# Patient Record
Sex: Male | Born: 1985 | Race: White | Hispanic: No | Smoking: Former smoker
Health system: Southern US, Community
[De-identification: ages and names within clinical notes are randomized; demographics above are authoritative.]

## PROBLEM LIST (undated history)

## (undated) DIAGNOSIS — L400 Psoriasis vulgaris: Secondary | ICD-10-CM

## (undated) DIAGNOSIS — L4050 Arthropathic psoriasis, unspecified: Secondary | ICD-10-CM

## (undated) DIAGNOSIS — K219 Gastro-esophageal reflux disease without esophagitis: Secondary | ICD-10-CM

## (undated) HISTORY — PX: CHOLECYSTECTOMY: SHX55

---

## 2004-08-30 HISTORY — PX: KNEE RECONSTRUCTION: SHX5883

## 2018-10-24 DIAGNOSIS — N4400 Torsion of testis, unspecified: Secondary | ICD-10-CM

## 2018-10-24 DIAGNOSIS — K851 Biliary acute pancreatitis without necrosis or infection: Secondary | ICD-10-CM

## 2018-10-24 HISTORY — DX: Biliary acute pancreatitis without necrosis or infection: K85.10

## 2018-10-24 HISTORY — DX: Torsion of testis, unspecified: N44.00

## 2018-10-24 HISTORY — PX: ESOPHAGOGASTRODUODENOSCOPY: SHX1529

## 2020-03-10 DIAGNOSIS — F909 Attention-deficit hyperactivity disorder, unspecified type: Secondary | ICD-10-CM | POA: Diagnosis not present

## 2020-06-10 DIAGNOSIS — N4611 Organic oligospermia: Secondary | ICD-10-CM | POA: Diagnosis not present

## 2020-06-10 DIAGNOSIS — Z113 Encounter for screening for infections with a predominantly sexual mode of transmission: Secondary | ICD-10-CM | POA: Diagnosis not present

## 2020-06-23 DIAGNOSIS — Z3184 Encounter for fertility preservation procedure: Secondary | ICD-10-CM | POA: Diagnosis not present

## 2020-06-23 DIAGNOSIS — Z3141 Encounter for fertility testing: Secondary | ICD-10-CM | POA: Diagnosis not present

## 2020-07-21 DIAGNOSIS — Z3189 Encounter for other procreative management: Secondary | ICD-10-CM | POA: Diagnosis not present

## 2020-09-24 DIAGNOSIS — Z01818 Encounter for other preprocedural examination: Secondary | ICD-10-CM | POA: Diagnosis not present

## 2020-09-24 DIAGNOSIS — R1319 Other dysphagia: Secondary | ICD-10-CM | POA: Diagnosis not present

## 2020-09-24 DIAGNOSIS — Z9229 Personal history of other drug therapy: Secondary | ICD-10-CM | POA: Diagnosis not present

## 2020-09-27 ENCOUNTER — Emergency Department (HOSPITAL_COMMUNITY): Payer: BC Managed Care – PPO

## 2020-09-27 ENCOUNTER — Other Ambulatory Visit: Payer: Self-pay

## 2020-09-27 ENCOUNTER — Encounter (HOSPITAL_COMMUNITY): Payer: Self-pay | Admitting: Emergency Medicine

## 2020-09-27 ENCOUNTER — Inpatient Hospital Stay (HOSPITAL_COMMUNITY)
Admission: EM | Admit: 2020-09-27 | Discharge: 2020-09-29 | DRG: 287 | Disposition: A | Discharge status: Home / Self Care | Payer: BC Managed Care – PPO | Attending: Cardiology | Admitting: Cardiology

## 2020-09-27 DIAGNOSIS — R9439 Abnormal result of other cardiovascular function study: Secondary | ICD-10-CM | POA: Diagnosis not present

## 2020-09-27 DIAGNOSIS — Z20822 Contact with and (suspected) exposure to covid-19: Secondary | ICD-10-CM | POA: Diagnosis present

## 2020-09-27 DIAGNOSIS — I2510 Atherosclerotic heart disease of native coronary artery without angina pectoris: Secondary | ICD-10-CM

## 2020-09-27 DIAGNOSIS — R7303 Prediabetes: Secondary | ICD-10-CM

## 2020-09-27 DIAGNOSIS — K859 Acute pancreatitis without necrosis or infection, unspecified: Secondary | ICD-10-CM | POA: Diagnosis not present

## 2020-09-27 DIAGNOSIS — R079 Chest pain, unspecified: Secondary | ICD-10-CM

## 2020-09-27 DIAGNOSIS — K8689 Other specified diseases of pancreas: Secondary | ICD-10-CM | POA: Diagnosis not present

## 2020-09-27 DIAGNOSIS — E785 Hyperlipidemia, unspecified: Secondary | ICD-10-CM

## 2020-09-27 DIAGNOSIS — K760 Fatty (change of) liver, not elsewhere classified: Secondary | ICD-10-CM | POA: Diagnosis present

## 2020-09-27 DIAGNOSIS — I25110 Atherosclerotic heart disease of native coronary artery with unstable angina pectoris: Principal | ICD-10-CM | POA: Diagnosis present

## 2020-09-27 DIAGNOSIS — E7800 Pure hypercholesterolemia, unspecified: Secondary | ICD-10-CM | POA: Diagnosis not present

## 2020-09-27 DIAGNOSIS — K219 Gastro-esophageal reflux disease without esophagitis: Secondary | ICD-10-CM | POA: Diagnosis present

## 2020-09-27 DIAGNOSIS — R0602 Shortness of breath: Secondary | ICD-10-CM | POA: Diagnosis not present

## 2020-09-27 DIAGNOSIS — Z9049 Acquired absence of other specified parts of digestive tract: Secondary | ICD-10-CM | POA: Diagnosis not present

## 2020-09-27 DIAGNOSIS — R1110 Vomiting, unspecified: Secondary | ICD-10-CM | POA: Diagnosis not present

## 2020-09-27 DIAGNOSIS — R7989 Other specified abnormal findings of blood chemistry: Secondary | ICD-10-CM

## 2020-09-27 HISTORY — DX: Gastro-esophageal reflux disease without esophagitis: K21.9

## 2020-09-27 HISTORY — DX: Psoriasis vulgaris: L40.0

## 2020-09-27 HISTORY — DX: Arthropathic psoriasis, unspecified: L40.50

## 2020-09-27 LAB — CBC
HCT: 47.5 % (ref 39.0–52.0)
Hemoglobin: 16.1 g/dL (ref 13.0–17.0)
MCH: 30.9 pg (ref 26.0–34.0)
MCHC: 33.9 g/dL (ref 30.0–36.0)
MCV: 91.2 fL (ref 80.0–100.0)
Platelets: 201 10*3/uL (ref 150–400)
RBC: 5.21 MIL/uL (ref 4.22–5.81)
RDW: 13.2 % (ref 11.5–15.5)
WBC: 8.1 10*3/uL (ref 4.0–10.5)
nRBC: 0 % (ref 0.0–0.2)

## 2020-09-27 LAB — CREATININE, SERUM
Creatinine, Ser: 0.63 mg/dL (ref 0.61–1.24)
GFR, Estimated: >60 mL/min (ref >60)

## 2020-09-27 LAB — RESP PANEL BY RT-PCR (FLU A&B, COVID) ARPGX2
Influenza A by PCR: NEGATIVE
Influenza B by PCR: NEGATIVE
SARS Coronavirus 2 by RT PCR: NEGATIVE

## 2020-09-27 LAB — BASIC METABOLIC PANEL
Anion gap: 17 — ABNORMAL HIGH (ref 5–15)
BUN: 5 mg/dL — ABNORMAL LOW (ref 6–20)
CO2: 23 mmol/L (ref 22–32)
Calcium: 10.1 mg/dL (ref 8.9–10.3)
Chloride: 99 mmol/L (ref 98–111)
Creatinine, Ser: 0.83 mg/dL (ref 0.61–1.24)
GFR, Estimated: >60 mL/min (ref >60)
Glucose, Bld: 161 mg/dL — ABNORMAL HIGH (ref 70–99)
Potassium: 3.6 mmol/L (ref 3.5–5.1)
Sodium: 139 mmol/L (ref 135–145)

## 2020-09-27 LAB — HEMOGLOBIN A1C
Hgb A1c MFr Bld: 6.1 % — ABNORMAL HIGH (ref 4.8–5.6)
Mean Plasma Glucose: 128.37 mg/dL

## 2020-09-27 LAB — TSH: TSH: 2.962 u[IU]/mL (ref 0.350–4.500)

## 2020-09-27 LAB — LIPID PANEL
Cholesterol: 301 mg/dL — ABNORMAL HIGH (ref 0–200)
HDL: 129 mg/dL (ref >40)
LDL Cholesterol: 158 mg/dL — ABNORMAL HIGH (ref 0–99)
Total CHOL/HDL Ratio: 2.3 RATIO
Triglycerides: 69 mg/dL (ref <150)
VLDL: 14 mg/dL (ref 0–40)

## 2020-09-27 LAB — HIV ANTIBODY (ROUTINE TESTING W REFLEX): HIV Screen 4th Generation wRfx: NONREACTIVE

## 2020-09-27 LAB — HEPATIC FUNCTION PANEL
ALT: 175 U/L — ABNORMAL HIGH (ref 0–44)
AST: 160 U/L — ABNORMAL HIGH (ref 15–41)
Albumin: 4.3 g/dL (ref 3.5–5.0)
Alkaline Phosphatase: 102 U/L (ref 38–126)
Bilirubin, Direct: 0.2 mg/dL (ref 0.0–0.2)
Indirect Bilirubin: 0.6 mg/dL (ref 0.3–0.9)
Total Bilirubin: 0.8 mg/dL (ref 0.3–1.2)
Total Protein: 7.5 g/dL (ref 6.5–8.1)

## 2020-09-27 LAB — PROTIME-INR
INR: 0.9 (ref 0.8–1.2)
Prothrombin Time: 12.1 seconds (ref 11.4–15.2)

## 2020-09-27 LAB — LIPASE, BLOOD: Lipase: 21 U/L (ref 11–51)

## 2020-09-27 LAB — TROPONIN I (HIGH SENSITIVITY)
Troponin I (High Sensitivity): 6 ng/L (ref <18)
Troponin I (High Sensitivity): 5 ng/L (ref <18)

## 2020-09-27 MED ORDER — METOPROLOL TARTRATE 25 MG PO TABS
100.0000 mg | ORAL_TABLET | Freq: Once | ORAL | Status: DC
  Filled 2020-09-27: qty 4

## 2020-09-27 MED ORDER — SODIUM CHLORIDE 0.9 % IV SOLN: 250.0000 mL | INTRAVENOUS | Status: DC | PRN

## 2020-09-27 MED ORDER — ENOXAPARIN SODIUM 40 MG/0.4ML ~~LOC~~ SOLN
40.0000 mg | SUBCUTANEOUS | Status: DC
  Administered 2020-09-27 – 2020-09-29 (×2): 40 mg via SUBCUTANEOUS
  Filled 2020-09-27 (×3): qty 0.4

## 2020-09-27 MED ORDER — APREMILAST 30 MG PO TABS: 1.0000 | ORAL_TABLET | Freq: Two times a day (BID) | ORAL | Status: DC

## 2020-09-27 MED ORDER — LORAZEPAM 1 MG PO TABS
1.0000 mg | ORAL_TABLET | Freq: Once | ORAL | Status: AC
  Administered 2020-09-27: 1 mg via ORAL
  Filled 2020-09-27: qty 1

## 2020-09-27 MED ORDER — DULOXETINE HCL 30 MG PO CPEP
30.0000 mg | ORAL_CAPSULE | Freq: Every day | ORAL | Status: DC
  Administered 2020-09-27 – 2020-09-29 (×3): 30 mg via ORAL
  Filled 2020-09-27 (×3): qty 1

## 2020-09-27 MED ORDER — ASPIRIN EC 81 MG PO TBEC
81.0000 mg | DELAYED_RELEASE_TABLET | Freq: Every day | ORAL | Status: DC
  Administered 2020-09-28 – 2020-09-29 (×2): 81 mg via ORAL
  Filled 2020-09-27 (×2): qty 1

## 2020-09-27 MED ORDER — HYDROMORPHONE HCL 1 MG/ML IJ SOLN
1.0000 mg | Freq: Once | INTRAMUSCULAR | Status: AC
  Administered 2020-09-27: 1 mg via INTRAVENOUS
  Filled 2020-09-27: qty 1

## 2020-09-27 MED ORDER — SODIUM CHLORIDE 0.9% FLUSH
3.0000 mL | Freq: Two times a day (BID) | INTRAVENOUS | Status: DC
  Administered 2020-09-27 – 2020-09-28 (×3): 3 mL via INTRAVENOUS

## 2020-09-27 MED ORDER — FENTANYL CITRATE (PF) 100 MCG/2ML IJ SOLN: 100.0000 ug | Freq: Once | INTRAMUSCULAR | Status: DC

## 2020-09-27 MED ORDER — AMPHETAMINE-DEXTROAMPHET ER 10 MG PO CP24
30.0000 mg | ORAL_CAPSULE | Freq: Every morning | ORAL | Status: DC
  Administered 2020-09-29: 30 mg via ORAL
  Filled 2020-09-27: qty 3
  Filled 2020-09-27: qty 1

## 2020-09-27 MED ORDER — NITROGLYCERIN 0.4 MG SL SUBL
0.4000 mg | SUBLINGUAL_TABLET | SUBLINGUAL | Status: DC | PRN
  Filled 2020-09-27: qty 1

## 2020-09-27 MED ORDER — METOPROLOL TARTRATE 50 MG PO TABS
50.0000 mg | ORAL_TABLET | Freq: Two times a day (BID) | ORAL | Status: DC
  Administered 2020-09-27 – 2020-09-29 (×5): 50 mg via ORAL
  Filled 2020-09-27: qty 2
  Filled 2020-09-27 (×4): qty 1

## 2020-09-27 MED ORDER — PANTOPRAZOLE SODIUM 40 MG PO TBEC
40.0000 mg | DELAYED_RELEASE_TABLET | Freq: Two times a day (BID) | ORAL | Status: DC
  Administered 2020-09-27 – 2020-09-29 (×5): 40 mg via ORAL
  Filled 2020-09-27 (×5): qty 1

## 2020-09-27 MED ORDER — NITROGLYCERIN 0.4 MG SL SUBL: 0.4000 mg | SUBLINGUAL_TABLET | SUBLINGUAL | Status: DC | PRN

## 2020-09-27 MED ORDER — LACTATED RINGERS IV BOLUS
1000.0000 mL | Freq: Once | INTRAVENOUS | Status: AC
  Administered 2020-09-27: 1000 mL via INTRAVENOUS

## 2020-09-27 MED ORDER — ZOLPIDEM TARTRATE 5 MG PO TABS: 5.0000 mg | ORAL_TABLET | Freq: Every evening | ORAL | Status: DC | PRN

## 2020-09-27 MED ORDER — SODIUM CHLORIDE 0.9% FLUSH: 3.0000 mL | INTRAVENOUS | Status: DC | PRN

## 2020-09-27 MED ORDER — SODIUM CHLORIDE 0.9 % IV BOLUS
1000.0000 mL | Freq: Once | INTRAVENOUS | Status: AC
  Administered 2020-09-27: 1000 mL via INTRAVENOUS

## 2020-09-27 MED ORDER — ASPIRIN 81 MG PO CHEW
324.0000 mg | CHEWABLE_TABLET | Freq: Once | ORAL | Status: AC
  Administered 2020-09-27: 324 mg via ORAL
  Filled 2020-09-27: qty 4

## 2020-09-27 MED ORDER — ALPRAZOLAM 0.25 MG PO TABS: 0.2500 mg | ORAL_TABLET | Freq: Two times a day (BID) | ORAL | Status: DC | PRN

## 2020-09-27 MED ORDER — ACETAMINOPHEN 325 MG PO TABS
650.0000 mg | ORAL_TABLET | ORAL | Status: DC | PRN
  Administered 2020-09-29: 650 mg via ORAL
  Filled 2020-09-27: qty 2

## 2020-09-27 MED ORDER — IOHEXOL 350 MG/ML SOLN
100.0000 mL | Freq: Once | INTRAVENOUS | Status: AC | PRN
  Administered 2020-09-27: 100 mL via INTRAVENOUS

## 2020-09-27 MED ORDER — SODIUM CHLORIDE 0.9 % IV SOLN
80.0000 mg | Freq: Once | INTRAVENOUS | Status: AC
  Administered 2020-09-27: 06:00:00 80 mg via INTRAVENOUS
  Filled 2020-09-27: qty 80

## 2020-09-27 MED ORDER — ONDANSETRON HCL 4 MG/2ML IJ SOLN: 4.0000 mg | Freq: Four times a day (QID) | INTRAMUSCULAR | Status: DC | PRN

## 2020-09-27 NOTE — ED Provider Notes (Signed)
[facility] EMERGENCY DEPARTMENT Provider Note   CSN: [id number] Arrival date & time: 09/27/20  0216     History Chief Complaint  Patient presents with  . Chest Pain    Aaron Burke is a 34 y.o. male.  34 year old male with a history of pancreatitis from gallstones the presents the emerge department today secondary to chest pain.  Patient states he had acute onset of left-sided retrosternal chest pressure and pain.  He initially thought it was heartburn he had multiple episodes of vomiting and was sweaty.  He went to check his blood pressure and was elevated, he felt near syncopal so he crawled his way to his wife's room and was feeling some shortness of breath during this as well.  Was brought to the emergency room in similar condition. Patient states he has a history of indigestion initially thought that is what this might be but states this is much more severe in a different quality than his normal heartburn.  Patient also has a history of anxiety but states that at no point he does feel like a panic attack or primarily anxiety related.   Chest Pain Pain location:  L chest Pain quality: aching   Pain radiates to:  Does not radiate Pain severity:  Mild Onset quality:  Sudden Timing:  Constant Progression:  Improving Associated symptoms: diaphoresis, dizziness and shortness of breath        Past Medical History:  Diagnosis Date  . Pancreatitis     There are no problems to display for this patient.   Past Surgical History:  Procedure Laterality Date  . CHOLECYSTECTOMY         No family history on file.  Social History   Tobacco Use  . Smoking status: Never Smoker  . Smokeless tobacco: Never Used  Substance Use Topics  . Alcohol use: Never  . Drug use: Never    Home Medications Prior to Admission medications   Not on File    Allergies    Patient has no known allergies.  Review of Systems   Review of Systems  Constitutional:  Positive for diaphoresis.  Respiratory: Positive for shortness of breath.   Cardiovascular: Positive for chest pain.  Neurological: Positive for dizziness.  All other systems reviewed and are negative.   Physical Exam Updated Vital Signs BP (!) 130/95   Pulse 100   Temp 98.3 F (36.8 C) (Oral)   Resp 15   Ht 5\' 9"  (1.753 m)   Wt 82 kg   SpO2 98%   BMI 26.70 kg/m   Physical Exam Vitals and nursing note reviewed.  Constitutional:      Appearance: He is well-developed.  HENT:     Head: Normocephalic.     Nose: Nose normal.  Eyes:     Conjunctiva/sclera: Conjunctivae normal.  Cardiovascular:     Rate and Rhythm: Tachycardia present.  Pulmonary:     Effort: Pulmonary effort is normal. No respiratory distress.     Breath sounds: No decreased breath sounds.  Abdominal:     General: There is no distension.     Palpations: Abdomen is soft.  Musculoskeletal:        General: Normal range of motion.     Cervical back: Normal range of motion.     Right lower leg: No edema.     Left lower leg: No edema.  Skin:    General: Skin is warm and dry.  Neurological:     General: No focal  deficit present.     Mental Status: He is alert.     ED Results / Procedures / Treatments   Labs (all labs ordered are listed, but only abnormal results are displayed) Labs Reviewed  BASIC METABOLIC PANEL - Abnormal; Notable for the following components:      Result Value   Glucose, Bld 161 (*)    BUN 5 (*)    Anion gap 17 (*)    All other components within normal limits  RESP PANEL BY RT-PCR (FLU A&B, COVID) ARPGX2  CBC  PROTIME-INR  LIPASE, BLOOD  TROPONIN I (HIGH SENSITIVITY)  TROPONIN I (HIGH SENSITIVITY)    EKG EKG Interpretation  Date/Time:  Sunday September 27 2020 02:18:05 EST Ventricular Rate:  127 PR Interval:  136 QRS Duration: 80 QT Interval:  292 QTC Calculation: 424 R Axis:   83 Text Interpretation: Sinus tachycardia Otherwise normal ECG No old tracing to compare  Confirmed by Marily Memos [phone]) on 09/27/2020 3:15:44 AM   EKG Interpretation  Date/Time:  Sunday September 27 2020 08:25:46 EST Ventricular Rate:  101 PR Interval:  136 QRS Duration: 101 QT Interval:  348 QTC Calculation: 452 R Axis:   39 Text Interpretation: Sinus tachycardia Low voltage, precordial leads Abnormal inferior Q waves since last tracing no significant change Confirmed by Eber Hong ([id number]) on 09/27/2020 8:29:26 AM        Radiology DG Chest 2 View  Result Date: 09/27/2020 CLINICAL DATA:  Chest pain EXAM: CHEST - 2 VIEW COMPARISON:  None. FINDINGS: The heart size and mediastinal contours are within normal limits. Both lungs are clear. The visualized skeletal structures are unremarkable. IMPRESSION: No active cardiopulmonary disease. Electronically Signed   By: Katherine Mantle M.D.   On: 09/27/2020 02:53   CT Angio Chest/Abd/Pel for Dissection W and/or Wo Contrast  Result Date: 09/27/2020 CLINICAL DATA:  Central chest pain with shortness of breath and emesis this morning. EXAM: CT ANGIOGRAPHY CHEST, ABDOMEN AND PELVIS TECHNIQUE: Non-contrast CT of the chest was initially obtained. Multidetector CT imaging through the chest, abdomen and pelvis was performed using the standard protocol during bolus administration of intravenous contrast. Multiplanar reconstructed images and MIPs were obtained and reviewed to evaluate the vascular anatomy. CONTRAST:  OMNIPAQUE IOHEXOL 350 MG/ML SOLN COMPARISON:  None. FINDINGS: CTA CHEST FINDINGS Cardiovascular: Heart size is normal. No pericardial effusion. Dense coronary artery calcifications within the LEFT anterior descending coronary artery. No thoracic aortic aneurysm or evidence of aortic dissection. Mediastinum/Nodes: No mass or enlarged lymph nodes within the mediastinum or perihilar regions. Esophagus appears normal. Trachea and central bronchi are unremarkable. Lungs/Pleura: Lungs are clear.  No pleural effusion or  pneumothorax. Musculoskeletal: Osseous structures about the chest are unremarkable. Review of the MIP images confirms the above findings. CTA ABDOMEN AND PELVIS FINDINGS VASCULAR Aorta: Normal caliber aorta without aneurysm, dissection, vasculitis or significant stenosis. Celiac: Patent without evidence of aneurysm, dissection, vasculitis or significant stenosis. SMA: Patent without evidence of aneurysm, dissection, vasculitis or significant stenosis. Renals: Both renal arteries are patent without evidence of aneurysm, dissection, vasculitis, fibromuscular dysplasia or significant stenosis. IMA: Patent without evidence of aneurysm, dissection, vasculitis or significant stenosis. Inflow: Patent without evidence of aneurysm, dissection, vasculitis or significant stenosis. Veins: No obvious venous abnormality within the limitations of this arterial phase study. Review of the MIP images confirms the above findings. NON-VASCULAR Hepatobiliary: Liver is diffusely low in density indicating fatty infiltration. Surgical changes of cholecystectomy. Pancreas: Ill-defined low-density masslike area within the pancreatic head/uncinate process, measuring  2.9 cm greatest dimension (axial series 10, image 158; coronal series 13, images 57 through 63), suspicious for neoplastic mass. Associated pancreatic duct dilatation distal to the masslike area. No peripancreatic fluid. Spleen: Normal in size without focal abnormality. Adrenals/Urinary Tract: Adrenal glands appear normal. Kidneys are unremarkable without mass, stone or hydronephrosis. Bladder is unremarkable. Stomach/Bowel: No dilated large or small bowel loops. No evidence of bowel wall inflammation. Appendix appears normal. Stomach is unremarkable. Lymphatic: No enlarged lymph nodes are seen within the abdomen or pelvis. Reproductive: Prostate is unremarkable. Other: No free fluid or abscess collection. No free intraperitoneal air. Musculoskeletal: No acute or suspicious  osseous findings. Review of the MIP images confirms the above findings. IMPRESSION: 1. Ill-defined low-density masslike area within the pancreatic head/uncinate process, measuring 2.9 cm greatest dimension, suspicious for neoplastic mass, less likely focal pancreatitis. Recommend further characterization with nonemergent pancreatic protocol MRI. Ordering physician tells me that patient has a history of pancreatitis and pseudocyst. As such, this low-density area within the pancreatic head could represent chronic changes related to a previous episode of pancreatitis. However, neoplastic process remains a concern and pancreatic protocol MRI is still recommended. 2. Dense coronary artery calcifications within the LEFT anterior descending coronary artery. Recommend correlation with any possible associated cardiac symptoms. 3. Fatty infiltration of the liver. 4. Remainder of the chest, abdomen and pelvis CT is unremarkable. No thoracic or abdominal aortic aneurysm or dissection. No evidence of pneumonia or pulmonary edema. No bowel obstruction or evidence of bowel wall inflammation. Appendix is normal. These results were called by telephone at the time of interpretation on 09/27/2020 at 7:52 am to provider [facility] , who verbally acknowledged these results. Electronically Signed   By: Bary Richard M.D.   On: 09/27/2020 07:54    Procedures Procedures (including critical care time)  Medications Ordered in ED Medications  lactated ringers bolus 1,000 mL (0 mLs Intravenous Stopped 09/27/20 0736)  pantoprazole (PROTONIX) 80 mg in sodium chloride 0.9 % 100 mL IVPB (0 mg Intravenous Stopped 09/27/20 0631)  HYDROmorphone (DILAUDID) injection 1 mg (1 mg Intravenous Given 09/27/20 0602)  iohexol (OMNIPAQUE) 350 MG/ML injection 100 mL (100 mLs Intravenous Contrast Given 09/27/20 0645)    ED Course  I have reviewed the triage vital signs and the nursing notes.  Pertinent labs & imaging results that were available  during my care of the patient were reviewed by me and considered in my medical decision making (see chart for details).    MDM Rules/Calculators/A&P                         Overall patient appears well.  His troponins are negative his EKG is okay.  He is low but tachycardic and hypertensive when he got here but this resolved without intervention.  I CT scan concerning for couple findings.  The pancreatic abnormality I think is probably chronic as he had gallstone pancreatitis in the past status post cholecystectomy.  He has GI follow-up on Friday and will address this with them especially the need for an MRI. 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  Very well could be the cause of his symptoms.  Will discuss with the cardiologist for consultation and further recommendations.   8:25 AM Pain has returned. Will repeat ECG. Dilaudid/NTG/ASA ordered.  Repeat ECG unremarkable. D/W cardiologist, will see for recommendations Care transferred pending cards  consult. Holding heparin for now.   Final Clinical Impression(s) / ED Diagnoses Final diagnoses:  None    Rx / DC Orders ED Discharge Orders    None       Alexina Niccoli, Barbara CowerJason, MD 09/27/20 2258

## 2020-09-27 NOTE — ED Triage Notes (Signed)
Patient reports central chest pain with SOB and emesis this morning "indigestion" , denies cough or fever .

## 2020-09-27 NOTE — ED Notes (Signed)
Attempted to call report to the floor. RN unable to take report at this time.

## 2020-09-27 NOTE — Consult Note (Addendum)
Cardiology Consultation:   Patient ID: Aaron Burke; [id number]; [birth year]   Admit date: 09/27/2020 Date of Consult: 09/27/2020  Primary Care Provider: Darrin Nipperollege, [facility] [place] Primary Cardiologist: Little Ishikawahristopher L Natasha Burda, MD New Primary Electrophysiologist:  None   Patient Profile:   Aaron Burke is a 34 y.o. male with no hx of any cardiac issues but has FH CAD, who is being seen today for the evaluation of chest pain at the request of Dr Hyacinth MeekerMiller.  History of Present Illness:   Aaron Burke was in his USOH until early today. At about 1 am, he began vomiting from GERD, then had onset of SSCP.   The pain was mid-chest, like someone sitting on his chest. He was SOB, already nauseated and vomiting. He was dizzy. BP was 172/121, much higher than usual. 9/10 initially.  He took a baby ASA and a clonazepam, then came to the ER.   The clonazepam relaxed him.   In the ER, he got Dilaudid 2 mg, ASA 324 mg, IV Protonix and LR IV. After the 2nd dose of Dilaudid, sx resolved.  He now feels normal. Had a HA, but not that is gone.   He went surfing the week of Thanksgiving, was playing with the dogs and raking leaves. No hx CP w/ exertion.    Past Medical History:  Diagnosis Date  . Gallstone pancreatitis 2020  . GERD (gastroesophageal reflux disease)   . Plaque psoriasis   . Psoriatic arthritis (HCC)   . Testicular torsion 2020    Past Surgical History:  Procedure Laterality Date  . CHOLECYSTECTOMY    . ESOPHAGOGASTRODUODENOSCOPY  2020   with dilatation  . KNEE RECONSTRUCTION Left 08/30/2004     Prior to Admission medications   Medication Sig Start Date End Date Taking? Authorizing Provider  ADDERALL XR 30 MG 24 hr capsule Take 30 mg by mouth every morning. 08/28/20   [provider]  DULoxetine (CYMBALTA) 30 MG capsule Take 30 mg by mouth daily. 09/01/20   [provider]  OTEZLA 30 MG TABS Take 1 tablet by mouth 2 (two) times daily. 06/11/20    [provider]     Inpatient Medications: Scheduled Meds:  Continuous Infusions: . sodium chloride     PRN Meds:   Allergies:   No Known Allergies  Social History:   Social History   Socioeconomic History  . Marital status: Married    Spouse name: Not on file  . Number of children: Not on file  . Years of education: Not on file  . Highest education level: Not on file  Occupational History  . 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 Smoking status: Never Smoker  . Smokeless tobacco: Never Used  Substance and Sexual Activity  . Alcohol use: Never  . Drug use: Never  . Sexual activity: Not on file  Other Topics Concern  . Not on file  Social History Narrative   [place]/ [place] (pregnant).   Social Determinants of Health   Financial Resource Strain:   . Difficulty of Paying Living Expenses: Not on file  Food Insecurity:   . Worried About Programme researcher, broadcasting/film/videounning Out of Food in the Last Year: Not on file  . Ran Out of Food in the Last Year: Not on file  Transportation Needs:   . Lack of Transportation (Medical): Not on file  . Lack of Transportation (Non-Medical): Not on file  Physical Activity:   .  Days of Exercise per Week: Not on file  . Minutes of Exercise per Session: Not on file  Stress:   . Feeling of Stress : Not on file  Social Connections:   . Frequency of Communication with Friends and Family: Not on file  . Frequency of Social Gatherings with Friends and Family: Not on file  . Attends Religious Services: Not on file  . Active Member of Clubs or Organizations: Not on file  . Attends Banker Meetings: Not on file  . Marital Status: Not on file  Intimate Partner Violence:   . Fear of Current or Ex-Partner: Not on file  . Emotionally Abused: Not on file  . Physically Abused: Not on file  . Sexually Abused: Not on file    Family History:   Family History  Problem Relation Age of Onset  .  Hyperlipidemia Mother   . Heart attack Father        x 2, total 4 stents   Family Status:  Family Status  Relation Name Status  . Mother  Alive  . Father  Alive    ROS:  Please see the history of present illness.  All other ROS reviewed and negative.     Physical Exam/Data:   Vitals:   09/27/20 0930 09/27/20 1000 09/27/20 1030 09/27/20 1045  BP: 130/87 (!) 145/88 (!) 148/101 (!) 145/98  Pulse: 99 98 91 88  Resp: 15 14 (!) 22 (!) 23  Temp:      TempSrc:      SpO2: 96% 96% 95% 95%  Weight:      Height:        Intake/Output Summary (Last 24 hours) at 09/27/2020 1116 Last data filed at 09/27/2020 0736 Gross per 24 hour  Intake 1100 ml  Output --  Net 1100 ml    Last 3 Weights 09/27/2020  Weight (lbs) 180 lb 12.4 oz  Weight (kg) 82 kg     Body mass index is 26.7 kg/m.   General:  Well nourished, well developed, male in no acute distress HEENT: normal Lymph: no adenopathy Neck: JVD - Not elevated Endocrine:  No thryomegaly Vascular: No carotid bruits; 4/4 extremity pulses 2+  Cardiac:  normal S1, S2; RRR; no murmur Lungs:  clear bilaterally, no wheezing, rhonchi or rales  Abd: soft, nontender, no hepatomegaly  Ext: no edema Musculoskeletal:  No deformities, BUE and BLE strength normal and equal Skin: warm and dry  Neuro:  CNs 2-12 intact, no focal abnormalities noted Psych:  Normal affect   EKG:  The EKG was personally reviewed and demonstrates:  ST, HR 101, slightly decreased voltage precordial leads Telemetry:  Telemetry was personally reviewed and demonstrates:  ST   CV studies:   None   Laboratory Data:   Chemistry Recent Labs  Lab 09/27/20 0235  NA 139  K 3.6  CL 99  CO2 23  GLUCOSE 161*  BUN 5*  CREATININE 0.83  CALCIUM 10.1  GFRNONAA >60  ANIONGAP 17*    Lab Results  Component Value Date   ALT 175 (H) 09/27/2020   AST 160 (H) 09/27/2020   ALKPHOS 102 09/27/2020   BILITOT 0.8 09/27/2020   Hematology Recent Labs  Lab  09/27/20 0235  WBC 8.1  RBC 5.21  HGB 16.1  HCT 47.5  MCV 91.2  MCH 30.9  MCHC 33.9  RDW 13.2  PLT 201   Cardiac Enzymes High Sensitivity Troponin:   Recent Labs  Lab 09/27/20 0235 09/27/20 0612  TROPONINIHS 6 5      BNPNo results for input(s): BNP, PROBNP in the last 168 hours.  DDimer No results for input(s): DDIMER in the last 168 hours. TSH: No results found for: TSH Lipids: Lab Results  Component Value Date   CHOL 301 (H) 09/27/2020   HDL 129 09/27/2020   LDLCALC 158 (H) 09/27/2020   TRIG 69 09/27/2020   CHOLHDL 2.3 09/27/2020   HgbA1c:No results found for: HGBA1C Magnesium: No results found for: MG   Radiology/Studies:  DG Chest 2 View  Result Date: 09/27/2020 CLINICAL DATA:  Chest pain EXAM: CHEST - 2 VIEW COMPARISON:  None. FINDINGS: The heart size and mediastinal contours are within normal limits. Both lungs are clear. The visualized skeletal structures are unremarkable. IMPRESSION: No active cardiopulmonary disease. Electronically Signed   By: Katherine Mantle M.D.   On: 09/27/2020 02:53   CT Angio Chest/Abd/Pel for Dissection W and/or Wo Contrast  Result Date: 09/27/2020 CLINICAL DATA:  Central chest pain with shortness of breath and emesis this morning. EXAM: CT ANGIOGRAPHY CHEST, ABDOMEN AND PELVIS TECHNIQUE: Non-contrast CT of the chest was initially obtained. Multidetector CT imaging through the chest, abdomen and pelvis was performed using the standard protocol during bolus administration of intravenous contrast. Multiplanar reconstructed images and MIPs were obtained and reviewed to evaluate the vascular anatomy. CONTRAST:  OMNIPAQUE IOHEXOL 350 MG/ML SOLN COMPARISON:  None. FINDINGS: CTA CHEST FINDINGS Cardiovascular: Heart size is normal. No pericardial effusion. Dense coronary artery calcifications within the LEFT anterior descending coronary artery. No thoracic aortic aneurysm or evidence of aortic dissection. Mediastinum/Nodes: No mass or  enlarged lymph nodes within the mediastinum or perihilar regions. Esophagus appears normal. Trachea and central bronchi are unremarkable. Lungs/Pleura: Lungs are clear.  No pleural effusion or pneumothorax. Musculoskeletal: Osseous structures about the chest are unremarkable. Review of the MIP images confirms the above findings. CTA ABDOMEN AND PELVIS FINDINGS VASCULAR Aorta: Normal caliber aorta without aneurysm, dissection, vasculitis or significant stenosis. Celiac: Patent without evidence of aneurysm, dissection, vasculitis or significant stenosis. SMA: Patent without evidence of aneurysm, dissection, vasculitis or significant stenosis. Renals: Both renal arteries are patent without evidence of aneurysm, dissection, vasculitis, fibromuscular dysplasia or significant stenosis. IMA: Patent without evidence of aneurysm, dissection, vasculitis or significant stenosis. Inflow: Patent without evidence of aneurysm, dissection, vasculitis or significant stenosis. Veins: No obvious venous abnormality within the limitations of this arterial phase study. Review of the MIP images confirms the above findings. NON-VASCULAR Hepatobiliary: Liver is diffusely low in density indicating fatty infiltration. Surgical changes of cholecystectomy. Pancreas: Ill-defined low-density masslike area within the pancreatic head/uncinate process, measuring 2.9 cm greatest dimension (axial series 10, image 158; coronal series 13, images 57 through 63), suspicious for neoplastic mass. Associated pancreatic duct dilatation distal to the masslike area. No peripancreatic fluid. Spleen: Normal in size without focal abnormality. Adrenals/Urinary Tract: Adrenal glands appear normal. Kidneys are unremarkable without mass, stone or hydronephrosis. Bladder is unremarkable. Stomach/Bowel: No dilated large or small bowel loops. No evidence of bowel wall inflammation. Appendix appears normal. Stomach is unremarkable. Lymphatic: No enlarged lymph nodes are  seen within the abdomen or pelvis. Reproductive: Prostate is unremarkable. Other: No free fluid or abscess collection. No free intraperitoneal air. Musculoskeletal: No acute or suspicious osseous findings. Review of the MIP images confirms the above findings. IMPRESSION: 1. Ill-defined low-density masslike area within the pancreatic head/uncinate process, measuring 2.9 cm greatest dimension, suspicious for neoplastic mass, less likely focal pancreatitis. Recommend further characterization with nonemergent pancreatic protocol MRI.  Ordering physician tells me that patient has a history of pancreatitis and pseudocyst. As such, this low-density area within the pancreatic head could represent chronic changes related to a previous episode of pancreatitis. However, neoplastic process remains a concern and pancreatic protocol MRI is still recommended. 2. Dense coronary artery calcifications within the LEFT anterior descending coronary artery. Recommend correlation with any possible associated cardiac symptoms. 3. Fatty infiltration of the liver. 4. Remainder of the chest, abdomen and pelvis CT is unremarkable. No thoracic or abdominal aortic aneurysm or dissection. No evidence of pneumonia or pulmonary edema. No bowel obstruction or evidence of bowel wall inflammation. Appendix is normal. These results were called by telephone at the time of interpretation on 09/27/2020 at 7:52 am to provider [facility] , who verbally acknowledged these results. Electronically Signed   By: Bary Richard M.D.   On: 09/27/2020 07:54    Assessment and Plan:   1. Chest pain, moderate risk of cardiac etiology - sx resolved w/ rx, duration was about 5 hours - never had before, good activity level w/out limitations - however, pt has strong family hx of CAD in his father and has calcium on the prox LAD on CT >> think definitive ischemic eval indicated - ck lipids and LFTs for screening, pt says cholesterol a little high last year. - BP  was high initially, but not normally. - cardiac CT likely best option, but HR is elevated, discuss w/ MD.  2. GI issues, GERD, esophageal polyps - has had problems w/ GERD causing N&V on a regular basis.  - also has hx esophageal polyps - has GI MD and is for EGD very soon.  3.  Hyperlipidemia -See lipid profile above -With the calcium on his CT, and family history of premature CAD, goal LDL is less than 70 -However, AST and ALT are elevated, likely related to pancreatic issues -Discuss adding a statin with MD   Otherwise, continue home rx for psoriasis and ADHD  Principal Problem:   Chest pain with moderate risk for cardiac etiology     For questions or updates, please contact [facility] Please consult [email or web address] for contact info under Cardiology/STEMI.   SignedTheodore Demark, PA-C  09/27/2020 11:16 AM   Patient seen and examined.  Agree with above documentation.  Aaron Burke is a 34 year old male with a history of gallstone pancreatitis, pancreatic pseudocyst, esophageal stenosis who who presents today for chest pain.  Reports overnight he had onset of chest pain that he describes as left-sided pressure, 9 out of 10 in intensity, lasted about 5 minutes then improved.  Did have some residual pain that then lasted for hours, resolved with pain meds in the ED.  He is active, reports that he regularly goes on hikes and hunts, and denies any prior exertional chest pain.  In the ED, vital signs notable for pulse 128, BP 150/115, SPO2 97% on room air.  Labs notable for creatinine 0.8, AST 160, ALT 175, LDL 158, hemoglobin 16.1, platelets 201, WBC 8.1 high-sensitivity troponin I 6 > 5.  EKG shows sinus tachycardia, rate 127, no ST abnormality, TWI in III.  On exam, patient is alert and oriented, regular rate and rhythm, no murmurs, lungs CTAB, no LE edema or JVD.  CT chest abdomen pelvis showed ill-defined low-density masslike area within the pancreatic head, dense coronary artery  calcifications within the LAD.  For his chest pain, no evidence of MI with negative troponins.  He is currently chest pain-free.  He does have  a significant family history of CAD, and despite his age, has a significant amount of calcifications in his proximal LAD.  Recommend further evaluation.  Ideally would do a coronary CTA today, but this is limited by several factors: his HR is in the 100s, he just received a contrast bolus for another CT, and he took Viagra yesterday so would need to be cautious about using NTG.  I would recommend the patient be admitted and started on beta-blocker.  If heart rate controlled tomorrow, can evaluate with coronary CTA.  If heart rate remains elevated, would favor Myoview to evaluate for evidence of ischemia.  Will check echo.  I did discuss his pancreas CT findings with ED physician, felt to likely represent changes from his known pseudocyst/pancreatitis, and were planning outpatient follow-up.  He has been following with GI for this.  Suspect his LFTs are elevated due to his pancreatic issues, will monitor for stability and hold off on statin at this time.  If unable to start statin due to transaminitis, will need to establish in lipid clinic for PCSK9 inhibitor.  Little Ishikawa, MD

## 2020-09-27 NOTE — ED Notes (Signed)
Patient transported to CT 

## 2020-09-27 NOTE — ED Provider Notes (Addendum)
Pt seen by cardiology - recommends coronary CT but has heavy calcifications which may make the study less meaninful or useful. They will admit and stress tomorrow     Eber Hong, MD 09/27/20 1023    Eber Hong, MD 09/27/20 1055

## 2020-09-28 ENCOUNTER — Observation Stay (HOSPITAL_COMMUNITY): Payer: BC Managed Care – PPO

## 2020-09-28 ENCOUNTER — Encounter (HOSPITAL_COMMUNITY): Payer: Self-pay | Admitting: Cardiology

## 2020-09-28 DIAGNOSIS — R079 Chest pain, unspecified: Secondary | ICD-10-CM | POA: Diagnosis not present

## 2020-09-28 DIAGNOSIS — E7800 Pure hypercholesterolemia, unspecified: Secondary | ICD-10-CM

## 2020-09-28 DIAGNOSIS — K8689 Other specified diseases of pancreas: Secondary | ICD-10-CM | POA: Diagnosis not present

## 2020-09-28 LAB — NM MYOCAR MULTI W/SPECT W/WALL MOTION / EF
Estimated workload: 1 METS
Exercise duration (min): 5 min
Exercise duration (sec): 16 s
Peak HR: 90 {beats}/min
Rest HR: 46 {beats}/min

## 2020-09-28 LAB — COMPREHENSIVE METABOLIC PANEL
ALT: 129 U/L — ABNORMAL HIGH (ref 0–44)
AST: 95 U/L — ABNORMAL HIGH (ref 15–41)
Albumin: 3.6 g/dL (ref 3.5–5.0)
Alkaline Phosphatase: 91 U/L (ref 38–126)
Anion gap: 8 (ref 5–15)
BUN: 6 mg/dL (ref 6–20)
CO2: 30 mmol/L (ref 22–32)
Calcium: 9.7 mg/dL (ref 8.9–10.3)
Chloride: 103 mmol/L (ref 98–111)
Creatinine, Ser: 0.83 mg/dL (ref 0.61–1.24)
GFR, Estimated: >60 mL/min (ref >60)
Glucose, Bld: 108 mg/dL — ABNORMAL HIGH (ref 70–99)
Potassium: 4.6 mmol/L (ref 3.5–5.1)
Sodium: 141 mmol/L (ref 135–145)
Total Bilirubin: 1.2 mg/dL (ref 0.3–1.2)
Total Protein: 6.4 g/dL — ABNORMAL LOW (ref 6.5–8.1)

## 2020-09-28 LAB — ECHOCARDIOGRAM COMPLETE
Area-P 1/2: 2.73 cm2
Height: 69 in
S' Lateral: 2.9 cm
Weight: 2593.6 oz

## 2020-09-28 MED ORDER — TECHNETIUM TC 99M TETROFOSMIN IV KIT
10.6000 | PACK | Freq: Once | INTRAVENOUS | Status: AC | PRN
  Administered 2020-09-28: 10.6 via INTRAVENOUS

## 2020-09-28 MED ORDER — REGADENOSON 0.4 MG/5ML IV SOLN
INTRAVENOUS | Status: AC
  Administered 2020-09-28: 0.4 mg via INTRAVENOUS
  Filled 2020-09-28: qty 5

## 2020-09-28 MED ORDER — TECHNETIUM TC 99M TETROFOSMIN IV KIT
32.6000 | PACK | Freq: Once | INTRAVENOUS | Status: AC | PRN
  Administered 2020-09-28: 32.6 via INTRAVENOUS

## 2020-09-28 MED ORDER — REGADENOSON 0.4 MG/5ML IV SOLN: 0.4000 mg | Freq: Once | INTRAVENOUS | Status: AC

## 2020-09-28 NOTE — Progress Notes (Signed)
Rudi Rummage presented for a nuclear stress test today.  No immediate complications.  Stress imaging is pending at this time.   Preliminary EKG findings may be listed in the chart, but the stress test result will not be finalized until perfusion imaging is complete.   Manson Passey, [place]  09/28/2020 12:56 PM

## 2020-09-28 NOTE — Progress Notes (Signed)
Progress Note  Patient Name: Aaron Burke Date of Encounter: 09/28/2020  [facility] Cardiologist: Little Ishikawa, MD   Subjective   Feeling well.  No recurrent emesis.  No chest pain.  Inpatient Medications    Scheduled Meds: . amphetamine-dextroamphetamine  30 mg Oral q morning - 10a  . Apremilast  1 tablet Oral BID  . aspirin EC  81 mg Oral Daily  . DULoxetine  30 mg Oral Daily  . enoxaparin (LOVENOX) injection  40 mg Subcutaneous Q24H  . metoprolol tartrate  50 mg Oral BID  . pantoprazole  40 mg Oral BID  . sodium chloride flush  3 mL Intravenous Q12H   Continuous Infusions: . sodium chloride     PRN Meds: sodium chloride, acetaminophen, ALPRAZolam, nitroGLYCERIN, ondansetron (ZOFRAN) IV, sodium chloride flush, zolpidem   Vital Signs    Vitals:   09/27/20 1954 09/28/20 0026 09/28/20 0458 09/28/20 0749  BP: 127/88 (!) 132/102 116/71 128/83  Pulse: (!) 52 (!) 56 (!) 52 (!) 58  Resp: 20 20 20 20   Temp: 97.9 F (36.6 C) 98.1 F (36.7 C) 97.6 F (36.4 C) 98.1 F (36.7 C)  TempSrc: Oral Oral Oral Oral  SpO2: 98% 98% 99% 99%  Weight:  73.5 kg    Height:        Intake/Output Summary (Last 24 hours) at 09/28/2020 0834 Last data filed at 09/27/2020 2140 Gross per 24 hour  Intake 1292.4 ml  Output 460 ml  Net 832.4 ml   Last 3 Weights 09/28/2020 09/27/2020  Weight (lbs) 162 lb 1.6 oz 180 lb 12.4 oz  Weight (kg) 73.528 kg 82 kg      Telemetry    Sinus rhythm, sinus bradycardia.  Rate 40s to 70s- Personally Reviewed  ECG    N/A- Personally Reviewed  Physical Exam   GEN: No acute distress.   Neck: No JVD Cardiac: RRR, no murmurs, rubs, or gallops.  Respiratory: Clear to auscultation bilaterally. GI: Soft, nontender, non-distended  MS: No edema; No deformity. Neuro:  Nonfocal  Psych: Normal affect   Labs    High Sensitivity Troponin:   Recent Labs  Lab 09/27/20 0235 09/27/20 0612  TROPONINIHS 6 5      Chemistry Recent Labs   Lab 09/27/20 0235 09/27/20 0612 09/27/20 1135 09/28/20 0542  NA 139  --   --  141  K 3.6  --   --  4.6  CL 99  --   --  103  CO2 23  --   --  30  GLUCOSE 161*  --   --  108*  BUN 5*  --   --  6  CREATININE 0.83  --  0.63 0.83  CALCIUM 10.1  --   --  9.7  PROT  --  7.5  --  6.4*  ALBUMIN  --  4.3  --  3.6  AST  --  160*  --  95*  ALT  --  175*  --  129*  ALKPHOS  --  102  --  91  BILITOT  --  0.8  --  1.2  GFRNONAA >60  --  >60 >60  ANIONGAP 17*  --   --  8     Hematology Recent Labs  Lab 09/27/20 0235  WBC 8.1  RBC 5.21  HGB 16.1  HCT 47.5  MCV 91.2  MCH 30.9  MCHC 33.9  RDW 13.2  PLT 201    BNPNo results for input(s): BNP, PROBNP in  the last 168 hours.   DDimer No results for input(s): DDIMER in the last 168 hours.   Radiology    DG Chest 2 View  Result Date: 09/27/2020 CLINICAL DATA:  Chest pain EXAM: CHEST - 2 VIEW COMPARISON:  None. FINDINGS: The heart size and mediastinal contours are within normal limits. Both lungs are clear. The visualized skeletal structures are unremarkable. IMPRESSION: No active cardiopulmonary disease. Electronically Signed   By: Katherine Mantle M.D.   On: 09/27/2020 02:53   CT Angio Chest/Abd/Pel for Dissection W and/or Wo Contrast  Result Date: 09/27/2020 CLINICAL DATA:  Central chest pain with shortness of breath and emesis this morning. EXAM: CT ANGIOGRAPHY CHEST, ABDOMEN AND PELVIS TECHNIQUE: Non-contrast CT of the chest was initially obtained. Multidetector CT imaging through the chest, abdomen and pelvis was performed using the standard protocol during bolus administration of intravenous contrast. Multiplanar reconstructed images and MIPs were obtained and reviewed to evaluate the vascular anatomy. CONTRAST:  OMNIPAQUE IOHEXOL 350 MG/ML SOLN COMPARISON:  None. FINDINGS: CTA CHEST FINDINGS Cardiovascular: Heart size is normal. No pericardial effusion. Dense coronary artery calcifications within the LEFT anterior  descending coronary artery. No thoracic aortic aneurysm or evidence of aortic dissection. Mediastinum/Nodes: No mass or enlarged lymph nodes within the mediastinum or perihilar regions. Esophagus appears normal. Trachea and central bronchi are unremarkable. Lungs/Pleura: Lungs are clear.  No pleural effusion or pneumothorax. Musculoskeletal: Osseous structures about the chest are unremarkable. Review of the MIP images confirms the above findings. CTA ABDOMEN AND PELVIS FINDINGS VASCULAR Aorta: Normal caliber aorta without aneurysm, dissection, vasculitis or significant stenosis. Celiac: Patent without evidence of aneurysm, dissection, vasculitis or significant stenosis. SMA: Patent without evidence of aneurysm, dissection, vasculitis or significant stenosis. Renals: Both renal arteries are patent without evidence of aneurysm, dissection, vasculitis, fibromuscular dysplasia or significant stenosis. IMA: Patent without evidence of aneurysm, dissection, vasculitis or significant stenosis. Inflow: Patent without evidence of aneurysm, dissection, vasculitis or significant stenosis. Veins: No obvious venous abnormality within the limitations of this arterial phase study. Review of the MIP images confirms the above findings. NON-VASCULAR Hepatobiliary: Liver is diffusely low in density indicating fatty infiltration. Surgical changes of cholecystectomy. Pancreas: Ill-defined low-density masslike area within the pancreatic head/uncinate process, measuring 2.9 cm greatest dimension (axial series 10, image 158; coronal series 13, images 57 through 63), suspicious for neoplastic mass. Associated pancreatic duct dilatation distal to the masslike area. No peripancreatic fluid. Spleen: Normal in size without focal abnormality. Adrenals/Urinary Tract: Adrenal glands appear normal. Kidneys are unremarkable without mass, stone or hydronephrosis. Bladder is unremarkable. Stomach/Bowel: No dilated large or small bowel loops. No  evidence of bowel wall inflammation. Appendix appears normal. Stomach is unremarkable. Lymphatic: No enlarged lymph nodes are seen within the abdomen or pelvis. Reproductive: Prostate is unremarkable. Other: No free fluid or abscess collection. No free intraperitoneal air. Musculoskeletal: No acute or suspicious osseous findings. Review of the MIP images confirms the above findings. IMPRESSION: 1. Ill-defined low-density masslike area within the pancreatic head/uncinate process, measuring 2.9 cm greatest dimension, suspicious for neoplastic mass, less likely focal pancreatitis. Recommend further characterization with nonemergent pancreatic protocol MRI. Ordering physician tells me that patient has a history of pancreatitis and pseudocyst. As such, this low-density area within the pancreatic head could represent chronic changes related to a previous episode of pancreatitis. However, neoplastic process remains a concern and pancreatic protocol MRI is still recommended. 2. Dense coronary artery calcifications within the LEFT anterior descending coronary artery. Recommend correlation with any possible associated cardiac  symptoms. 3. Fatty infiltration of the liver. 4. Remainder of the chest, abdomen and pelvis CT is unremarkable. No thoracic or abdominal aortic aneurysm or dissection. No evidence of pneumonia or pulmonary edema. No bowel obstruction or evidence of bowel wall inflammation. Appendix is normal. These results were called by telephone at the time of interpretation on 09/27/2020 at 7:52 am to provider [facility] , who verbally acknowledged these results. Electronically Signed   By: Bary Richard M.D.   On: 09/27/2020 07:54    Cardiac Studies   Echo pending  Lexiscan Myoview pending   Patient Profile     34 y.o. male with GERD and family history of CAD and chest pain after emesis.   Assessment & Plan    # Chest pain:  # Coronary calcification:  #Pure hypercholesterolemia: LAD calcification  noted on chest CT.  LDL is 158.  We won't start statin given his elevated LFTs.  He would benefit from a PCSK9 inhibitor as an outpatient.  LDL goal is less than 70.  Stress test pending for this morning.  # Pancreatic mass:  CT of the abdomen revealed a 2.9 cm mass in the pancreas.  Cannot rule out malignancy, though he also has a history of pancreatitis and pseudocyst.  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  This was due to gallstone pancreatitis.  Radiology recommend pancreatic protocol MRI. Lipase wnl.  He has a gastroenterologist and is scheduled to see them on Friday for esophageal dilation.  He will make sure to follow-up with them and have his MRI.  # Hepatic steatosis: Noted on CT.  AST/ALT both elevated, though lower than in the past.  # Pre-diabetes:  A1c is 6.1%.  He was a healthy lifestyle and exercises regularly.  His diet is pretty good.  We will need to work on limiting carbohydrates and if A1c remains elevated recommend starting Metformin.   For questions or updates, please contact [facility] Please consult [email or web address] for contact info under        Signed, Chilton Si, MD  09/28/2020, 8:34 AM

## 2020-09-28 NOTE — Progress Notes (Signed)
Echocardiogram 2D Echocardiogram has been performed.  Aaron Burke Aaron Burke 09/28/2020, 2:17 PM

## 2020-09-29 ENCOUNTER — Encounter (HOSPITAL_COMMUNITY): Payer: Self-pay | Admitting: Cardiology

## 2020-09-29 ENCOUNTER — Encounter (HOSPITAL_COMMUNITY)
Admission: EM | Disposition: A | Discharge status: Home / Self Care | Payer: Self-pay | Source: Home / Self Care | Attending: Cardiology

## 2020-09-29 DIAGNOSIS — R7989 Other specified abnormal findings of blood chemistry: Secondary | ICD-10-CM

## 2020-09-29 DIAGNOSIS — K219 Gastro-esophageal reflux disease without esophagitis: Secondary | ICD-10-CM | POA: Diagnosis present

## 2020-09-29 DIAGNOSIS — E785 Hyperlipidemia, unspecified: Secondary | ICD-10-CM

## 2020-09-29 DIAGNOSIS — E7800 Pure hypercholesterolemia, unspecified: Secondary | ICD-10-CM | POA: Diagnosis present

## 2020-09-29 DIAGNOSIS — R9439 Abnormal result of other cardiovascular function study: Secondary | ICD-10-CM

## 2020-09-29 DIAGNOSIS — R7303 Prediabetes: Secondary | ICD-10-CM | POA: Diagnosis present

## 2020-09-29 DIAGNOSIS — K760 Fatty (change of) liver, not elsewhere classified: Secondary | ICD-10-CM | POA: Diagnosis present

## 2020-09-29 DIAGNOSIS — R079 Chest pain, unspecified: Secondary | ICD-10-CM | POA: Diagnosis present

## 2020-09-29 DIAGNOSIS — Z20822 Contact with and (suspected) exposure to covid-19: Secondary | ICD-10-CM | POA: Diagnosis present

## 2020-09-29 DIAGNOSIS — K8689 Other specified diseases of pancreas: Secondary | ICD-10-CM

## 2020-09-29 DIAGNOSIS — I25110 Atherosclerotic heart disease of native coronary artery with unstable angina pectoris: Secondary | ICD-10-CM | POA: Diagnosis present

## 2020-09-29 HISTORY — PX: LEFT HEART CATH AND CORONARY ANGIOGRAPHY: CATH118249

## 2020-09-29 LAB — CBC
HCT: 43.8 % (ref 39.0–52.0)
Hemoglobin: 14.3 g/dL (ref 13.0–17.0)
MCH: 31.1 pg (ref 26.0–34.0)
MCHC: 32.6 g/dL (ref 30.0–36.0)
MCV: 95.2 fL (ref 80.0–100.0)
Platelets: 169 10*3/uL (ref 150–400)
RBC: 4.6 MIL/uL (ref 4.22–5.81)
RDW: 13.2 % (ref 11.5–15.5)
WBC: 4.9 10*3/uL (ref 4.0–10.5)
nRBC: 0 % (ref 0.0–0.2)

## 2020-09-29 SURGERY — LEFT HEART CATH AND CORONARY ANGIOGRAPHY
Anesthesia: LOCAL

## 2020-09-29 MED ORDER — SODIUM CHLORIDE 0.9 % WEIGHT BASED INFUSION
3.0000 mL/kg/h | INTRAVENOUS | Status: DC
  Administered 2020-09-29: 3 mL/kg/h via INTRAVENOUS

## 2020-09-29 MED ORDER — SODIUM CHLORIDE 0.9% FLUSH: 3.0000 mL | INTRAVENOUS | Status: DC | PRN

## 2020-09-29 MED ORDER — SODIUM CHLORIDE 0.9 % IV SOLN: 250.0000 mL | INTRAVENOUS | Status: DC | PRN

## 2020-09-29 MED ORDER — SODIUM CHLORIDE 0.9 % WEIGHT BASED INFUSION
1.0000 mL/kg/h | INTRAVENOUS | Status: DC
  Administered 2020-09-29: 1 mL/kg/h via INTRAVENOUS

## 2020-09-29 MED ORDER — MIDAZOLAM HCL 2 MG/2ML IJ SOLN
INTRAMUSCULAR | Status: AC
  Filled 2020-09-29: qty 2

## 2020-09-29 MED ORDER — HEPARIN (PORCINE) IN NACL 1000-0.9 UT/500ML-% IV SOLN
INTRAVENOUS | Status: AC
  Filled 2020-09-29: qty 1000

## 2020-09-29 MED ORDER — VERAPAMIL HCL 2.5 MG/ML IV SOLN
INTRAVENOUS | Status: AC
  Filled 2020-09-29: qty 2

## 2020-09-29 MED ORDER — FENTANYL CITRATE (PF) 100 MCG/2ML IJ SOLN
INTRAMUSCULAR | Status: DC | PRN
  Administered 2020-09-29: 25 ug via INTRAVENOUS

## 2020-09-29 MED ORDER — VERAPAMIL HCL 2.5 MG/ML IV SOLN: INTRAVENOUS | Status: DC | PRN

## 2020-09-29 MED ORDER — HEPARIN (PORCINE) IN NACL 1000-0.9 UT/500ML-% IV SOLN
INTRAVENOUS | Status: DC | PRN
  Administered 2020-09-29 (×2): 500 mL

## 2020-09-29 MED ORDER — ENOXAPARIN SODIUM 40 MG/0.4ML ~~LOC~~ SOLN: 40.0000 mg | SUBCUTANEOUS | Status: DC

## 2020-09-29 MED ORDER — IOHEXOL 350 MG/ML SOLN
INTRAVENOUS | Status: DC | PRN
  Administered 2020-09-29: 25 mL

## 2020-09-29 MED ORDER — SODIUM CHLORIDE 0.9% FLUSH: 3.0000 mL | Freq: Two times a day (BID) | INTRAVENOUS | Status: DC

## 2020-09-29 MED ORDER — LIDOCAINE HCL (PF) 1 % IJ SOLN
INTRAMUSCULAR | Status: DC | PRN
  Administered 2020-09-29: 2 mL

## 2020-09-29 MED ORDER — ASPIRIN 81 MG PO CHEW: 81.0000 mg | CHEWABLE_TABLET | ORAL | Status: DC

## 2020-09-29 MED ORDER — LIDOCAINE HCL (PF) 1 % IJ SOLN
INTRAMUSCULAR | Status: AC
  Filled 2020-09-29: qty 30

## 2020-09-29 MED ORDER — FENTANYL CITRATE (PF) 100 MCG/2ML IJ SOLN
INTRAMUSCULAR | Status: AC
  Filled 2020-09-29: qty 2

## 2020-09-29 MED ORDER — HEPARIN SODIUM (PORCINE) 1000 UNIT/ML IJ SOLN
INTRAMUSCULAR | Status: DC | PRN
  Administered 2020-09-29: 4000 [IU] via INTRAVENOUS

## 2020-09-29 MED ORDER — HEPARIN SODIUM (PORCINE) 1000 UNIT/ML IJ SOLN
INTRAMUSCULAR | Status: AC
  Filled 2020-09-29: qty 1

## 2020-09-29 MED ORDER — MIDAZOLAM HCL 2 MG/2ML IJ SOLN
INTRAMUSCULAR | Status: DC | PRN
  Administered 2020-09-29: 1 mg via INTRAVENOUS

## 2020-09-29 SURGICAL SUPPLY — 10 items
CATH 5FR JL3.5 JR4 ANG PIG MP (CATHETERS) ×2 IMPLANT
DEVICE RAD COMP TR BAND LRG (VASCULAR PRODUCTS) ×2 IMPLANT
GLIDESHEATH SLEND SS 6F .021 (SHEATH) ×2 IMPLANT
GUIDEWIRE INQWIRE 1.5J.035X260 (WIRE) ×1 IMPLANT
INQWIRE 1.5J .035X260CM (WIRE) ×2
KIT HEART LEFT (KITS) ×2 IMPLANT
PACK CARDIAC CATHETERIZATION (CUSTOM PROCEDURE TRAY) ×2 IMPLANT
SHEATH PROBE COVER 6X72 (BAG) ×2 IMPLANT
TRANSDUCER W/STOPCOCK (MISCELLANEOUS) ×2 IMPLANT
TUBING CIL FLEX 10 FLL-RA (TUBING) ×2 IMPLANT

## 2020-09-29 NOTE — H&P (View-Only) (Signed)
Progress Note  Patient Name: Aaron Burke Date of Encounter: 09/29/2020  [facility] Cardiologist: Little Ishikawa, MD   Subjective   Feeling well.  No recurrent emesis.  No chest pain.  Inpatient Medications    Scheduled Meds: . amphetamine-dextroamphetamine  30 mg Oral q morning - 10a  . Apremilast  1 tablet Oral BID  . aspirin  81 mg Oral Pre-Cath  . aspirin EC  81 mg Oral Daily  . DULoxetine  30 mg Oral Daily  . enoxaparin (LOVENOX) injection  40 mg Subcutaneous Q24H  . metoprolol tartrate  50 mg Oral BID  . pantoprazole  40 mg Oral BID  . sodium chloride flush  3 mL Intravenous Q12H  . sodium chloride flush  3 mL Intravenous Q12H   Continuous Infusions: . sodium chloride    . sodium chloride    . sodium chloride     Followed by  . sodium chloride     PRN Meds: sodium chloride, sodium chloride, acetaminophen, ALPRAZolam, nitroGLYCERIN, ondansetron (ZOFRAN) IV, sodium chloride flush, sodium chloride flush, zolpidem   Vital Signs    Vitals:   09/28/20 1710 09/28/20 1936 09/29/20 0022 09/29/20 0337  BP: 114/83 124/82 126/79 133/72  Pulse: (!) 57 (!) 56 65 (!) 50  Resp: 20 17 18 18   Temp: 98.1 F (36.7 C) 98.6 F (37 C) 98.4 F (36.9 C) 98.6 F (37 C)  TempSrc: Oral Oral Oral Oral  SpO2: 98% 96% 97% 96%  Weight:    73.8 kg  Height:        Intake/Output Summary (Last 24 hours) at 09/29/2020 0958 Last data filed at 09/29/2020 0342 Gross per 24 hour  Intake 1200 ml  Output 950 ml  Net 250 ml   Last 3 Weights 09/29/2020 09/28/2020 09/27/2020  Weight (lbs) 162 lb 12.8 oz 162 lb 1.6 oz 180 lb 12.4 oz  Weight (kg) 73.846 kg 73.528 kg 82 kg      Telemetry    Sinus rhythm, sinus bradycardia.  Rate 40s to 70s- Personally Reviewed  ECG    N/A- Personally Reviewed  Physical Exam   VS:  BP 133/72 (BP Location: Right Arm)   Pulse (!) 50   Temp 98.6 F (37 C) (Oral)   Resp 18   Ht 5\' 9"  (1.753 m)   Wt 73.8 kg   SpO2 96%   BMI 24.04 kg/m   , BMI Body mass index is 24.04 kg/m. GENERAL:  Well appearing HEENT: Pupils equal round and reactive, fundi not visualized, oral mucosa unremarkable NECK:  No jugular venous distention, waveform within normal limits, carotid upstroke brisk and symmetric, no bruits LUNGS:  Clear to auscultation bilaterally HEART:  RRR.  PMI not displaced or sustained,S1 and S2 within normal limits, no S3, no S4, no clicks, no rubs, no murmurs ABD:  Flat, positive bowel sounds normal in frequency in pitch, no bruits, no rebound, no guarding, no midline pulsatile mass, no hepatomegaly, no splenomegaly EXT:  2 plus pulses throughout, no edema, no cyanosis no clubbing SKIN:  No rashes no nodules NEURO:  Cranial nerves II through XII grossly intact, motor grossly intact throughout PSYCH:  Cognitively intact, oriented to person place and time   Labs    High Sensitivity Troponin:   Recent Labs  Lab 09/27/20 0235 09/27/20 0612  TROPONINIHS 6 5      Chemistry Recent Labs  Lab 09/27/20 0235 09/27/20 0612 09/27/20 1135 09/28/20 0542  NA 139  --   --  141  K 3.6  --   --  4.6  CL 99  --   --  103  CO2 23  --   --  30  GLUCOSE 161*  --   --  108*  BUN 5*  --   --  6  CREATININE 0.83  --  0.63 0.83  CALCIUM 10.1  --   --  9.7  PROT  --  7.5  --  6.4*  ALBUMIN  --  4.3  --  3.6  AST  --  160*  --  95*  ALT  --  175*  --  129*  ALKPHOS  --  102  --  91  BILITOT  --  0.8  --  1.2  GFRNONAA >60  --  >60 >60  ANIONGAP 17*  --   --  8     Hematology Recent Labs  Lab 09/27/20 0235  WBC 8.1  RBC 5.21  HGB 16.1  HCT 47.5  MCV 91.2  MCH 30.9  MCHC 33.9  RDW 13.2  PLT 201    BNPNo results for input(s): BNP, PROBNP in the last 168 hours.   DDimer No results for input(s): DDIMER in the last 168 hours.   Radiology    NM Myocar Multi W/Spect W/Wall Motion / EF  Result Date: 09/28/2020 CLINICAL DATA:  Chest pain EXAM: MYOCARDIAL IMAGING WITH SPECT (REST AND PHARMACOLOGIC-STRESS) GATED LEFT  VENTRICULAR WALL MOTION STUDY LEFT VENTRICULAR EJECTION FRACTION TECHNIQUE: Standard myocardial SPECT imaging was performed after resting intravenous injection of 10 mCi Tc-38m tetrofosmin. Subsequently, intravenous infusion of Lexiscan was performed under the supervision of the Cardiology staff. At peak effect of the drug, 30 mCi Tc-58m tetrofosmin was injected intravenously and standard myocardial SPECT imaging was performed. Quantitative gated imaging was also performed to evaluate left ventricular wall motion, and estimate left ventricular ejection fraction. COMPARISON:  None. FINDINGS: Perfusion: There is adjacent bowel activity along the inferior wall on rest imaging only. Allowing for this, there is still decreased activity of the inferior wall on stress imaging on both the short axis and vertical axis imaging. Difficult to exclude inferior wall ischemia with pharmacologic stress. Wall Motion: Normal left ventricular wall motion. No left ventricular dilation. Left Ventricular Ejection Fraction: 70 % End diastolic volume 93 ml End systolic volume 28 ml IMPRESSION: 1. Findings suspicious for inferior wall ischemia with pharmacologic stress. 2. Normal left ventricular wall motion. 3. Left ventricular ejection fraction 70% 4. Non invasive risk stratification*: Intermediate *2012 Appropriate Use Criteria for Coronary Revascularization Focused Update: J Am Coll Cardiol. 2012;59(9):857-881. http://content.dementiazones.com.aspx?articleid=[email or web address] Electronically Signed   By: Judie Petit.  Shick M.D.   On: 09/28/2020 14:29   ECHOCARDIOGRAM COMPLETE  Result Date: 09/28/2020    ECHOCARDIOGRAM REPORT   Patient Name:   Aaron Burke Date of Exam: 09/28/2020 Medical Rec #:  [id number]      Height:       69.0 in Accession #:    [id number]     Weight:       162.1 lb Date of Birth:  [birth year]      BSA:          1.890 m Patient Age:    34 years       BP:           125/91 mmHg Patient Gender: M              HR:           54 bpm.  Exam  Location:  Inpatient Procedure: 2D Echo, Color Doppler and Cardiac Doppler Indications:    R07.9* Chest pain, unspecified  History:        Patient has no prior history of Echocardiogram examinations.  Sonographer:    Irving BurtonEmily Senior RDCS Referring Phys: [id number] CHRISTOPHER L SCHUMANN IMPRESSIONS  1. Left ventricular ejection fraction, by estimation, is 60 to 65%. The left ventricle has normal function. The left ventricle has no regional wall motion abnormalities. Left ventricular diastolic parameters were normal.  2. Right ventricular systolic function is normal. The right ventricular size is normal. Tricuspid regurgitation signal is inadequate for assessing PA pressure.  3. The mitral valve is grossly normal. No evidence of mitral valve regurgitation. No evidence of mitral stenosis.  4. The aortic valve is tricuspid. Aortic valve regurgitation is not visualized. No aortic stenosis is present.  5. The inferior vena cava is normal in size with greater than 50% respiratory variability, suggesting right atrial pressure of 3 mmHg. Conclusion(s)/Recommendation(s): Normal biventricular function without evidence of hemodynamically significant valvular heart disease. FINDINGS  Left Ventricle: Left ventricular ejection fraction, by estimation, is 60 to 65%. The left ventricle has normal function. The left ventricle has no regional wall motion abnormalities. The left ventricular internal cavity size was normal in size. There is  no left ventricular hypertrophy. Left ventricular diastolic parameters were normal. Normal left ventricular filling pressure. Right Ventricle: The right ventricular size is normal. No increase in right ventricular wall thickness. Right ventricular systolic function is normal. Tricuspid regurgitation signal is inadequate for assessing PA pressure. Left Atrium: Left atrial size was normal in size. Right Atrium: Right atrial size was normal in size. Pericardium: Trivial pericardial effusion is present.  Mitral Valve: The mitral valve is grossly normal. No evidence of mitral valve regurgitation. No evidence of mitral valve stenosis. Tricuspid Valve: The tricuspid valve is grossly normal. Tricuspid valve regurgitation is not demonstrated. No evidence of tricuspid stenosis. Aortic Valve: The aortic valve is tricuspid. Aortic valve regurgitation is not visualized. No aortic stenosis is present. Pulmonic Valve: The pulmonic valve was grossly normal. Pulmonic valve regurgitation is not visualized. No evidence of pulmonic stenosis. Aorta: The aortic root is normal in size and structure. Venous: The right upper pulmonary vein is normal. The inferior vena cava is normal in size with greater than 50% respiratory variability, suggesting right atrial pressure of 3 mmHg. IAS/Shunts: The atrial septum is grossly normal.  LEFT VENTRICLE PLAX 2D LVIDd:         4.60 cm  Diastology LVIDs:         2.90 cm  LV e' medial:    11.40 cm/s LV PW:         0.80 cm  LV E/e' medial:  5.9 LV IVS:        0.90 cm  LV e' lateral:   13.90 cm/s LVOT diam:     2.20 cm  LV E/e' lateral: 4.9 LV SV:         78 LV SV Index:   41 LVOT Area:     3.80 cm  RIGHT VENTRICLE RV S prime:     10.30 cm/s TAPSE (M-mode): 2.1 cm LEFT ATRIUM             Index       RIGHT ATRIUM           Index LA diam:        2.40 cm 1.27 cm/m  RA Area:     15.40 cm LA Vol (  A2C):   45.7 ml 24.19 ml/m RA Volume:   41.50 ml  21.96 ml/m LA Vol (A4C):   31.9 ml 16.88 ml/m LA Biplane Vol: 41.6 ml 22.02 ml/m  AORTIC VALVE LVOT Vmax:   87.70 cm/s LVOT Vmean:  63.500 cm/s LVOT VTI:    0.205 m  AORTA Ao Root diam: 3.30 cm MITRAL VALVE MV Area (PHT): 2.73 cm    SHUNTS MV Decel Time: 278 msec    Systemic VTI:  0.20 m MV E velocity: 67.50 cm/s  Systemic Diam: 2.20 cm MV A velocity: 32.10 cm/s MV E/A ratio:  2.10 Lennie Odor MD Electronically signed by Lennie Odor MD Signature Date/Time: 09/28/2020/3:28:17 PM    Final     Cardiac Studies   Echo 09/29/20: 1. Left ventricular  ejection fraction, by estimation, is 60 to 65%. The  left ventricle has normal function. The left ventricle has no regional  wall motion abnormalities. Left ventricular diastolic parameters were  normal.  2. Right ventricular systolic function is normal. The right ventricular  size is normal. Tricuspid regurgitation signal is inadequate for assessing  PA pressure.  3. The mitral valve is grossly normal. No evidence of mitral valve  regurgitation. No evidence of mitral stenosis.  4. The aortic valve is tricuspid. Aortic valve regurgitation is not  visualized. No aortic stenosis is present.  5. The inferior vena cava is normal in size with greater than 50%  respiratory variability, suggesting right atrial pressure of 3 mmHg.   Lexiscan Myoview 09/28/20: IMPRESSION: 1. Findings suspicious for inferior wall ischemia with pharmacologic stress.  2. Normal left ventricular wall motion.  3. Left ventricular ejection fraction 70%  4. Non invasive risk stratification*: Intermediate   Patient Profile     34 y.o. male with GERD and family history of CAD and chest pain after emesis.   Assessment & Plan    # Chest pain:  # Coronary calcification:  #Pure hypercholesterolemia: LAD calcification noted on chest CT.  LDL is 158.  We won't start statin given his elevated LFTs.  He would benefit from a PCSK9 inhibitor as an outpatient.  LDL goal is less than 70.  Stress test was consistent with inferior ischemia.  Calcification on his CT was in the LAD which is not consistent with this.  This may be diaphragmatic attenuation.  However we will proceed with left heart cath today to rule out obstructive heart disease.  Risks and benefits of cardiac catheterization have been discussed with the patient.  The patient understands that risks included but are not limited to stroke (1 in 1000), death (1 in 1000), kidney failure [usually temporary] (1 in 500), bleeding (1 in 200), allergic reaction  [possibly serious] (1 in 200). The patient understands and agrees to proceed.   # Pancreatic mass:  CT of the abdomen revealed a 2.9 cm mass in the pancreas.  Cannot rule out malignancy, though he also has a history of pancreatitis and pseudocyst.  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  This was due to gallstone pancreatitis.  Radiology recommend pancreatic protocol MRI. Lipase wnl.  He has a gastroenterologist and is scheduled to see them on Friday for esophageal dilation.  He will make sure to follow-up with them and have his MRI.  # Hepatic steatosis: Noted on CT.  AST/ALT both elevated, though lower than in the past.  # Pre-diabetes:  A1c is 6.1%.  He was a healthy lifestyle and exercises regularly.  His diet is pretty  good.  We will need to work on limiting carbohydrates and if A1c remains elevated recommend starting Metformin.   For questions or updates, please contact [facility] Please consult [email or web address] for contact info under        Signed, Chilton Si, MD  09/29/2020, 9:58 AM

## 2020-09-29 NOTE — Progress Notes (Signed)
TR band removed, dressing in placed, clean dry and intact. Discharge instruction provided, pt educated on how to take care of the site. Discharged patient on stable condition.

## 2020-09-29 NOTE — Progress Notes (Signed)
Received report form Bethany, RN and assumed care at this time.

## 2020-09-29 NOTE — Progress Notes (Signed)
Obtained informed consent, confirmed with pt NPO status and attempted to start secondary IV site x 2 attempts. IV team consulted for IV start. NPO sign placed on door.

## 2020-09-29 NOTE — Interval H&P Note (Signed)
History and Physical Interval Note:  09/29/2020 1:04 PM  Aaron Burke  has presented today for surgery, with the diagnosis of unstable angina.  The various methods of treatment have been discussed with the patient and family. After consideration of risks, benefits and other options for treatment, the patient has consented to  Procedure(s): LEFT HEART CATH AND CORONARY ANGIOGRAPHY (N/A) as a surgical intervention.  The patient's history has been reviewed, patient examined, no change in status, stable for surgery.  I have reviewed the patient's chart and labs.  Questions were answered to the patient's satisfaction.     Theron Ar[facility] 09/29/2020 1:04 PM Cath Lab Visit (complete for each Cath Lab visit)  Clinical Evaluation Leading to the Procedure:   ACS: Yes.    Non-ACS:    Anginal Classification: CCS II  Anti-ischemic medical therapy: Minimal Therapy (1 class of medications)  Non-Invasive Test Results: Intermediate-risk stress test findings: cardiac mortality 1-3%/year  Prior CABG: No previous CABG

## 2020-09-29 NOTE — Progress Notes (Signed)
Pt received from cath lab. TR band in place on right radial wrist. VSS. Call bell in reach. Will continue to monitor.  Hazle Nordmann, RN

## 2020-09-29 NOTE — Discharge Summary (Signed)
Discharge Summary    Patient ID: Aaron Burke MRN: [id number]; DOB: 06/21/1986  Admit date: 09/27/2020 Discharge date: 09/29/2020  Primary Care Provider: Darrin Nipperollege, [facility] [place]  Primary Cardiologist: [place]opher L Schumann, MD  Primary Electrophysiologist:  None   Discharge Diagnoses    Principal Problem:   Chest pain with moderate risk for cardiac etiology Active Problems:   Hyperlipidemia LDL goal <70   Pancreatic mass   Elevated LFTs   Pre-diabetes    Diagnostic Studies/Procedures    Left heart cath 09/29/20:  Prox LAD to Mid LAD lesion is 20% stenosed.  The left ventricular systolic function is normal.  LV end diastolic pressure is normal.  The left ventricular ejection fraction is 55-65% by visual estimate.   1. Minimal nonobstructive CAD  2. Normal LV function 3. Normal LVEDP  Plan: risk factor modification.  _____________  Nuclear stress test 09/28/20: IMPRESSION: 1. Findings suspicious for inferior wall ischemia with pharmacologic stress.  2. Normal left ventricular wall motion.  3. Left ventricular ejection fraction 70%  4. Non invasive risk stratification*: Intermediate  _____________  Echo 09/28/20: 1. Left ventricular ejection fraction, by estimation, is 60 to 65%. The  left ventricle has normal function. The left ventricle has no regional  wall motion abnormalities. Left ventricular diastolic parameters were  normal.  2. Right ventricular systolic function is normal. The right ventricular  size is normal. Tricuspid regurgitation signal is inadequate for assessing  PA pressure.  3. The mitral valve is grossly normal. No evidence of mitral valve  regurgitation. No evidence of mitral stenosis.  4. The aortic valve is tricuspid. Aortic valve regurgitation is not  visualized. No aortic stenosis is present.  5. The inferior vena cava is normal in size with greater than 50%  respiratory variability, suggesting right  atrial pressure of 3 mmHg.    History of Present Illness     Aaron Burke is a 34 y.o. male with no hx of any cardiac issues but has FH CAD, who is being seen today for the evaluation of chest pain.   [name] was in his USOH until early today. At about 1 am, he began vomiting from GERD, then had onset of SSCP.   The pain was mid-chest, like someone sitting on his chest. He was SOB, already nauseated and vomiting. He was dizzy. BP was 172/121, much higher than usual. 9/10 initially.  He took a baby ASA and a clonazepam, then came to the ER.   The clonazepam relaxed him.   In the ER, he got Dilaudid 2 mg, ASA 324 mg, IV Protonix and LR IV. After the 2nd dose of Dilaudid, sx resolved.  He now feels normal. Had a HA, but not that is gone.   He went surfing the week of Thanksgiving, was playing with the dogs and raking leaves. No hx CP w/ exertion.   Hospital Course     Consultants: none  Chest pain Coronary calcifications on CT Coronary calcification on CT chest. Given chest pain, LDL, and family history, he underwent a stress test that showed inferior ischemia. He proceeded with heart catheterization today that showed 20% stenosis in the LAD and normal LVEDP. Will need to focus on risk factor modification with lipids and A1c.    Hyperlipidemia with LDL goal < 70 09/27/2020: Cholesterol 301; HDL 129; LDL Cholesterol 158; Triglycerides 69; VLDL 14 Elevated LFTs precluded starting a statin medication. Consider PCSK9i in the outpatient setting.    Pancreatic mass CT abdomen showed  2.9 cm mass in the pancreas - can't rule out malignancy. He does have a history of pancreatitis and pseudocyst that has required drainage in the past felt secondary to gallstone pancreatitis. Radiology recommended pancreatic protocol MRI. He is scheduled to have an esophageal dilation with GI this Friday - will need to discuss MRI at that time.    Pre-diabetes A1c 6.1%. he has a healthy lifestyle  and exercises regularly. Will work on dietary changes - start metformin if still elevated in 3 months.     Did the patient have an acute coronary syndrome (MI, NSTEMI, STEMI, etc) this admission?:  No                               Did the patient have a percutaneous coronary intervention (stent / angioplasty)?:  No.       _____________  Discharge Vitals Blood pressure (!) 126/92, pulse 63, temperature 98.5 F (36.9 C), temperature source Oral, resp. rate 16, height 5\' 9"  (1.753 m), weight 73.8 kg, SpO2 96 %.  Filed Weights   09/27/20 0227 09/28/20 0026 09/29/20 0337  Weight: 82 kg 73.5 kg 73.8 kg    Labs & Radiologic Studies    CBC Recent Labs    09/27/20 0235 09/29/20 1356  WBC 8.1 4.9  HGB 16.1 14.3  HCT 47.5 43.8  MCV 91.2 95.2  PLT 201 169   Basic Metabolic Panel Recent Labs    14/07/21 0235 09/27/20 0235 09/27/20 1135 09/28/20 0542  NA 139  --   --  141  K 3.6  --   --  4.6  CL 99  --   --  103  CO2 23  --   --  30  GLUCOSE 161*  --   --  108*  BUN 5*  --   --  6  CREATININE 0.83   < > 0.63 0.83  CALCIUM 10.1  --   --  9.7   < > = values in this interval not displayed.   Liver Function Tests Recent Labs    09/27/20 0612 09/28/20 0542  AST 160* 95*  ALT 175* 129*  ALKPHOS 102 91  BILITOT 0.8 1.2  PROT 7.5 6.4*  ALBUMIN 4.3 3.6   Recent Labs    09/27/20 0612  LIPASE 21   High Sensitivity Troponin:   Recent Labs  Lab 09/27/20 0235 09/27/20 0612  TROPONINIHS 6 5    BNP Invalid input(s): POCBNP D-Dimer No results for input(s): DDIMER in the last 72 hours. Hemoglobin A1C Recent Labs    09/27/20 1135  HGBA1C 6.1*   Fasting Lipid Panel Recent Labs    09/27/20 0612  CHOL 301*  HDL 129  LDLCALC 158*  TRIG 69  CHOLHDL 2.3   Thyroid Function Tests Recent Labs    09/27/20 1135  TSH 2.962   _____________  DG Chest 2 View  Result Date: 09/27/2020 CLINICAL DATA:  Chest pain EXAM: CHEST - 2 VIEW COMPARISON:  None. FINDINGS: The  heart size and mediastinal contours are within normal limits. Both lungs are clear. The visualized skeletal structures are unremarkable. IMPRESSION: No active cardiopulmonary disease. Electronically Signed   By: 14/02/2020 M.D.   On: 09/27/2020 02:53   CARDIAC CATHETERIZATION  Result Date: 09/29/2020  Prox LAD to Mid LAD lesion is 20% stenosed.  The left ventricular systolic function is normal.  LV end diastolic pressure is normal.  The left ventricular ejection  fraction is 55-65% by visual estimate.  1. Minimal nonobstructive CAD 2. Normal LV function 3. Normal LVEDP Plan: risk factor modification.   NM Myocar Multi W/Spect W/Wall Motion / EF  Result Date: 09/28/2020 CLINICAL DATA:  Chest pain EXAM: MYOCARDIAL IMAGING WITH SPECT (REST AND PHARMACOLOGIC-STRESS) GATED LEFT VENTRICULAR WALL MOTION STUDY LEFT VENTRICULAR EJECTION FRACTION TECHNIQUE: Standard myocardial SPECT imaging was performed after resting intravenous injection of 10 mCi Tc-63m tetrofosmin. Subsequently, intravenous infusion of Lexiscan was performed under the supervision of the Cardiology staff. At peak effect of the drug, 30 mCi Tc-66m tetrofosmin was injected intravenously and standard myocardial SPECT imaging was performed. Quantitative gated imaging was also performed to evaluate left ventricular wall motion, and estimate left ventricular ejection fraction. COMPARISON:  None. FINDINGS: Perfusion: There is adjacent bowel activity along the inferior wall on rest imaging only. Allowing for this, there is still decreased activity of the inferior wall on stress imaging on both the short axis and vertical axis imaging. Difficult to exclude inferior wall ischemia with pharmacologic stress. Wall Motion: Normal left ventricular wall motion. No left ventricular dilation. Left Ventricular Ejection Fraction: 70 % End diastolic volume 93 ml End systolic volume 28 ml IMPRESSION: 1. Findings suspicious for inferior wall ischemia with  pharmacologic stress. 2. Normal left ventricular wall motion. 3. Left ventricular ejection fraction 70% 4. Non invasive risk stratification*: Intermediate *2012 Appropriate Use Criteria for Coronary Revascularization Focused Update: J Am Coll Cardiol. 2012;59(9):857-881. http://content.dementiazones.com.aspx?articleid=[email or web address] Electronically Signed   By: Judie Petit.  Shick M.D.   On: 09/28/2020 14:29   ECHOCARDIOGRAM COMPLETE  Result Date: 09/28/2020    ECHOCARDIOGRAM REPORT   Patient Name:   CODEN FRANCHI Date of Exam: 09/28/2020 Medical Rec #:  [id number]      Height:       69.0 in Accession #:    [id number]     Weight:       162.1 lb Date of Birth:  12-04-1985      BSA:          1.890 m Patient Age:    34 years       BP:           125/91 mmHg Patient Gender: M              HR:           54 bpm. Exam Location:  Inpatient Procedure: 2D Echo, Color Doppler and Cardiac Doppler Indications:    R07.9* Chest pain, unspecified  History:        Patient has no prior history of Echocardiogram examinations.  Sonographer:    Irving Burton Senior RDCS Referring Phys: [id number] CHRISTOPHER L SCHUMANN IMPRESSIONS  1. Left ventricular ejection fraction, by estimation, is 60 to 65%. The left ventricle has normal function. The left ventricle has no regional wall motion abnormalities. Left ventricular diastolic parameters were normal.  2. Right ventricular systolic function is normal. The right ventricular size is normal. Tricuspid regurgitation signal is inadequate for assessing PA pressure.  3. The mitral valve is grossly normal. No evidence of mitral valve regurgitation. No evidence of mitral stenosis.  4. The aortic valve is tricuspid. Aortic valve regurgitation is not visualized. No aortic stenosis is present.  5. The inferior vena cava is normal in size with greater than 50% respiratory variability, suggesting right atrial pressure of 3 mmHg. Conclusion(s)/Recommendation(s): Normal biventricular function without evidence of  hemodynamically significant valvular heart disease. FINDINGS  Left Ventricle: Left ventricular ejection fraction, by estimation, is 60 to 65%. The  left ventricle has normal function. The left ventricle has no regional wall motion abnormalities. The left ventricular internal cavity size was normal in size. There is  no left ventricular hypertrophy. Left ventricular diastolic parameters were normal. Normal left ventricular filling pressure. Right Ventricle: The right ventricular size is normal. No increase in right ventricular wall thickness. Right ventricular systolic function is normal. Tricuspid regurgitation signal is inadequate for assessing PA pressure. Left Atrium: Left atrial size was normal in size. Right Atrium: Right atrial size was normal in size. Pericardium: Trivial pericardial effusion is present. Mitral Valve: The mitral valve is grossly normal. No evidence of mitral valve regurgitation. No evidence of mitral valve stenosis. Tricuspid Valve: The tricuspid valve is grossly normal. Tricuspid valve regurgitation is not demonstrated. No evidence of tricuspid stenosis. Aortic Valve: The aortic valve is tricuspid. Aortic valve regurgitation is not visualized. No aortic stenosis is present. Pulmonic Valve: The pulmonic valve was grossly normal. Pulmonic valve regurgitation is not visualized. No evidence of pulmonic stenosis. Aorta: The aortic root is normal in size and structure. Venous: The right upper pulmonary vein is normal. The inferior vena cava is normal in size with greater than 50% respiratory variability, suggesting right atrial pressure of 3 mmHg. IAS/Shunts: The atrial septum is grossly normal.  LEFT VENTRICLE PLAX 2D LVIDd:         4.60 cm  Diastology LVIDs:         2.90 cm  LV e' medial:    11.40 cm/s LV PW:         0.80 cm  LV E/e' medial:  5.9 LV IVS:        0.90 cm  LV e' lateral:   13.90 cm/s LVOT diam:     2.20 cm  LV E/e' lateral: 4.9 LV SV:         78 LV SV Index:   41 LVOT Area:     3.80  cm  RIGHT VENTRICLE RV S prime:     10.30 cm/s TAPSE (M-mode): 2.1 cm LEFT ATRIUM             Index       RIGHT ATRIUM           Index LA diam:        2.40 cm 1.27 cm/m  RA Area:     15.40 cm LA Vol (A2C):   45.7 ml 24.19 ml/m RA Volume:   41.50 ml  21.96 ml/m LA Vol (A4C):   31.9 ml 16.88 ml/m LA Biplane Vol: 41.6 ml 22.02 ml/m  AORTIC VALVE LVOT Vmax:   87.70 cm/s LVOT Vmean:  63.500 cm/s LVOT VTI:    0.205 m  AORTA Ao Root diam: 3.30 cm MITRAL VALVE MV Area (PHT): 2.73 cm    SHUNTS MV Decel Time: 278 msec    Systemic VTI:  0.20 m MV E velocity: 67.50 cm/s  Systemic Diam: 2.20 cm MV A velocity: 32.10 cm/s MV E/A ratio:  2.10 Lennie Odor MD Electronically signed by Lennie Odor MD Signature Date/Time: 09/28/2020/3:28:17 PM    Final    CT Angio Chest/Abd/Pel for Dissection W and/or Wo Contrast  Result Date: 09/27/2020 CLINICAL DATA:  Central chest pain with shortness of breath and emesis this morning. EXAM: CT ANGIOGRAPHY CHEST, ABDOMEN AND PELVIS TECHNIQUE: Non-contrast CT of the chest was initially obtained. Multidetector CT imaging through the chest, abdomen and pelvis was performed using the standard protocol during bolus administration of intravenous contrast. Multiplanar reconstructed images and MIPs were  obtained and reviewed to evaluate the vascular anatomy. CONTRAST:  OMNIPAQUE IOHEXOL 350 MG/ML SOLN COMPARISON:  None. FINDINGS: CTA CHEST FINDINGS Cardiovascular: Heart size is normal. No pericardial effusion. Dense coronary artery calcifications within the LEFT anterior descending coronary artery. No thoracic aortic aneurysm or evidence of aortic dissection. Mediastinum/Nodes: No mass or enlarged lymph nodes within the mediastinum or perihilar regions. Esophagus appears normal. Trachea and central bronchi are unremarkable. Lungs/Pleura: Lungs are clear.  No pleural effusion or pneumothorax. Musculoskeletal: Osseous structures about the chest are unremarkable. Review of the MIP images  confirms the above findings. CTA ABDOMEN AND PELVIS FINDINGS VASCULAR Aorta: Normal caliber aorta without aneurysm, dissection, vasculitis or significant stenosis. Celiac: Patent without evidence of aneurysm, dissection, vasculitis or significant stenosis. SMA: Patent without evidence of aneurysm, dissection, vasculitis or significant stenosis. Renals: Both renal arteries are patent without evidence of aneurysm, dissection, vasculitis, fibromuscular dysplasia or significant stenosis. IMA: Patent without evidence of aneurysm, dissection, vasculitis or significant stenosis. Inflow: Patent without evidence of aneurysm, dissection, vasculitis or significant stenosis. Veins: No obvious venous abnormality within the limitations of this arterial phase study. Review of the MIP images confirms the above findings. NON-VASCULAR Hepatobiliary: Liver is diffusely low in density indicating fatty infiltration. Surgical changes of cholecystectomy. Pancreas: Ill-defined low-density masslike area within the pancreatic head/uncinate process, measuring 2.9 cm greatest dimension (axial series 10, image 158; coronal series 13, images 57 through 63), suspicious for neoplastic mass. Associated pancreatic duct dilatation distal to the masslike area. No peripancreatic fluid. Spleen: Normal in size without focal abnormality. Adrenals/Urinary Tract: Adrenal glands appear normal. Kidneys are unremarkable without mass, stone or hydronephrosis. Bladder is unremarkable. Stomach/Bowel: No dilated large or small bowel loops. No evidence of bowel wall inflammation. Appendix appears normal. Stomach is unremarkable. Lymphatic: No enlarged lymph nodes are seen within the abdomen or pelvis. Reproductive: Prostate is unremarkable. Other: No free fluid or abscess collection. No free intraperitoneal air. Musculoskeletal: No acute or suspicious osseous findings. Review of the MIP images confirms the above findings. IMPRESSION: 1. Ill-defined low-density  masslike area within the pancreatic head/uncinate process, measuring 2.9 cm greatest dimension, suspicious for neoplastic mass, less likely focal pancreatitis. Recommend further characterization with nonemergent pancreatic protocol MRI. Ordering physician tells me that patient has a history of pancreatitis and pseudocyst. As such, this low-density area within the pancreatic head could represent chronic changes related to a previous episode of pancreatitis. However, neoplastic process remains a concern and pancreatic protocol MRI is still recommended. 2. Dense coronary artery calcifications within the LEFT anterior descending coronary artery. Recommend correlation with any possible associated cardiac symptoms. 3. Fatty infiltration of the liver. 4. Remainder of the chest, abdomen and pelvis CT is unremarkable. No thoracic or abdominal aortic aneurysm or dissection. No evidence of pneumonia or pulmonary edema. No bowel obstruction or evidence of bowel wall inflammation. Appendix is normal. These results were called by telephone at the time of interpretation on 09/27/2020 at 7:52 am to provider [facility] , who verbally acknowledged these results. Electronically Signed   By: Bary Richard M.D.   On: 09/27/2020 07:54   Disposition   Pt is being discharged home today in good condition.  Follow-up Plans & Appointments     Follow-up Information    Azalee Course, [place] Follow up on 10/08/2020.   Specialties: Cardiology, Radiology Why: 10:45 am Contact information: [address] [place] [address] [phone]              Discharge Instructions  Diet - low sodium heart healthy   Complete by: As directed    Discharge instructions   Complete by: As directed    No driving for 2 days. No lifting over 5 lbs for 1 week. No sexual activity for 1 week. You may return to work in 1 week. Keep procedure site clean & dry. If you notice increased pain, swelling, bleeding or pus, call/return!   You may shower, but no soaking baths/hot tubs/pools for 1 week.   Increase activity slowly   Complete by: As directed       Discharge Medications   Allergies as of 09/29/2020      Reactions   Percocet [oxycodone-acetaminophen] Other (See Comments)   "makes me feel uncomfortable"      Medication List    TAKE these medications   Adderall XR 30 MG 24 hr capsule Generic drug: amphetamine-dextroamphetamine Take 30 mg by mouth daily.   clonazePAM 0.5 MG tablet Commonly known as: KLONOPIN Take 0.5 mg by mouth 2 (two) times daily as needed for anxiety.   DULoxetine 30 MG capsule Commonly known as: CYMBALTA Take 30 mg by mouth daily.   Otezla 30 MG Tabs Generic drug: Apremilast Take 1 tablet by mouth 2 (two) times daily.   pantoprazole 20 MG tablet Commonly known as: PROTONIX Take 20 mg by mouth daily.          Outstanding Labs/Studies   A1c in 3 months  PCSK9i - lipid clinic  Follow up on pancreatic mass  Duration of Discharge Encounter   Greater than 30 minutes including physician time.  Signed, Roe Rutherford Danaria Larsen, PA 09/29/2020, 3:44 PM

## 2020-09-29 NOTE — Progress Notes (Signed)
Progress Note  Patient Name: Aaron Burke Date of Encounter: 09/29/2020  [facility] Cardiologist: Little Ishikawa, MD   Subjective   Feeling well.  No recurrent emesis.  No chest pain.  Inpatient Medications    Scheduled Meds: . amphetamine-dextroamphetamine  30 mg Oral q morning - 10a  . Apremilast  1 tablet Oral BID  . aspirin  81 mg Oral Pre-Cath  . aspirin EC  81 mg Oral Daily  . DULoxetine  30 mg Oral Daily  . enoxaparin (LOVENOX) injection  40 mg Subcutaneous Q24H  . metoprolol tartrate  50 mg Oral BID  . pantoprazole  40 mg Oral BID  . sodium chloride flush  3 mL Intravenous Q12H  . sodium chloride flush  3 mL Intravenous Q12H   Continuous Infusions: . sodium chloride    . sodium chloride    . sodium chloride     Followed by  . sodium chloride     PRN Meds: sodium chloride, sodium chloride, acetaminophen, ALPRAZolam, nitroGLYCERIN, ondansetron (ZOFRAN) IV, sodium chloride flush, sodium chloride flush, zolpidem   Vital Signs    Vitals:   09/28/20 1710 09/28/20 1936 09/29/20 0022 09/29/20 0337  BP: 114/83 124/82 126/79 133/72  Pulse: (!) 57 (!) 56 65 (!) 50  Resp: 20 17 18 18   Temp: 98.1 F (36.7 C) 98.6 F (37 C) 98.4 F (36.9 C) 98.6 F (37 C)  TempSrc: Oral Oral Oral Oral  SpO2: 98% 96% 97% 96%  Weight:    73.8 kg  Height:        Intake/Output Summary (Last 24 hours) at 09/29/2020 0958 Last data filed at 09/29/2020 0342 Gross per 24 hour  Intake 1200 ml  Output 950 ml  Net 250 ml   Last 3 Weights 09/29/2020 09/28/2020 09/27/2020  Weight (lbs) 162 lb 12.8 oz 162 lb 1.6 oz 180 lb 12.4 oz  Weight (kg) 73.846 kg 73.528 kg 82 kg      Telemetry    Sinus rhythm, sinus bradycardia.  Rate 40s to 70s- Personally Reviewed  ECG    N/A- Personally Reviewed  Physical Exam   VS:  BP 133/72 (BP Location: Right Arm)   Pulse (!) 50   Temp 98.6 F (37 C) (Oral)   Resp 18   Ht 5\' 9"  (1.753 m)   Wt 73.8 kg   SpO2 96%   BMI 24.04 kg/m   , BMI Body mass index is 24.04 kg/m. GENERAL:  Well appearing HEENT: Pupils equal round and reactive, fundi not visualized, oral mucosa unremarkable NECK:  No jugular venous distention, waveform within normal limits, carotid upstroke brisk and symmetric, no bruits LUNGS:  Clear to auscultation bilaterally HEART:  RRR.  PMI not displaced or sustained,S1 and S2 within normal limits, no S3, no S4, no clicks, no rubs, no murmurs ABD:  Flat, positive bowel sounds normal in frequency in pitch, no bruits, no rebound, no guarding, no midline pulsatile mass, no hepatomegaly, no splenomegaly EXT:  2 plus pulses throughout, no edema, no cyanosis no clubbing SKIN:  No rashes no nodules NEURO:  Cranial nerves II through XII grossly intact, motor grossly intact throughout PSYCH:  Cognitively intact, oriented to person place and time   Labs    High Sensitivity Troponin:   Recent Labs  Lab 09/27/20 0235 09/27/20 0612  TROPONINIHS 6 5      Chemistry Recent Labs  Lab 09/27/20 0235 09/27/20 0612 09/27/20 1135 09/28/20 0542  NA 139  --   --  141  K 3.6  --   --  4.6  CL 99  --   --  103  CO2 23  --   --  30  GLUCOSE 161*  --   --  108*  BUN 5*  --   --  6  CREATININE 0.83  --  0.63 0.83  CALCIUM 10.1  --   --  9.7  PROT  --  7.5  --  6.4*  ALBUMIN  --  4.3  --  3.6  AST  --  160*  --  95*  ALT  --  175*  --  129*  ALKPHOS  --  102  --  91  BILITOT  --  0.8  --  1.2  GFRNONAA >60  --  >60 >60  ANIONGAP 17*  --   --  8     Hematology Recent Labs  Lab 09/27/20 0235  WBC 8.1  RBC 5.21  HGB 16.1  HCT 47.5  MCV 91.2  MCH 30.9  MCHC 33.9  RDW 13.2  PLT 201    BNPNo results for input(s): BNP, PROBNP in the last 168 hours.   DDimer No results for input(s): DDIMER in the last 168 hours.   Radiology    NM Myocar Multi W/Spect W/Wall Motion / EF  Result Date: 09/28/2020 CLINICAL DATA:  Chest pain EXAM: MYOCARDIAL IMAGING WITH SPECT (REST AND PHARMACOLOGIC-STRESS) GATED LEFT  VENTRICULAR WALL MOTION STUDY LEFT VENTRICULAR EJECTION FRACTION TECHNIQUE: Standard myocardial SPECT imaging was performed after resting intravenous injection of 10 mCi Tc-38m tetrofosmin. Subsequently, intravenous infusion of Lexiscan was performed under the supervision of the Cardiology staff. At peak effect of the drug, 30 mCi Tc-58m tetrofosmin was injected intravenously and standard myocardial SPECT imaging was performed. Quantitative gated imaging was also performed to evaluate left ventricular wall motion, and estimate left ventricular ejection fraction. COMPARISON:  None. FINDINGS: Perfusion: There is adjacent bowel activity along the inferior wall on rest imaging only. Allowing for this, there is still decreased activity of the inferior wall on stress imaging on both the short axis and vertical axis imaging. Difficult to exclude inferior wall ischemia with pharmacologic stress. Wall Motion: Normal left ventricular wall motion. No left ventricular dilation. Left Ventricular Ejection Fraction: 70 % End diastolic volume 93 ml End systolic volume 28 ml IMPRESSION: 1. Findings suspicious for inferior wall ischemia with pharmacologic stress. 2. Normal left ventricular wall motion. 3. Left ventricular ejection fraction 70% 4. Non invasive risk stratification*: Intermediate *2012 Appropriate Use Criteria for Coronary Revascularization Focused Update: J Am Coll Cardiol. 2012;59(9):857-881. http://content.dementiazones.com.aspx?articleid=[email or web address] Electronically Signed   By: Judie Petit.  Shick M.D.   On: 09/28/2020 14:29   ECHOCARDIOGRAM COMPLETE  Result Date: 09/28/2020    ECHOCARDIOGRAM REPORT   Patient Name:   Aaron Burke Date of Exam: 09/28/2020 Medical Rec #:  [id number]      Height:       69.0 in Accession #:    [id number]     Weight:       162.1 lb Date of Birth:  [birth year]      BSA:          1.890 m Patient Age:    34 years       BP:           125/91 mmHg Patient Gender: M              HR:           54 bpm.  Exam  Location:  Inpatient Procedure: 2D Echo, Color Doppler and Cardiac Doppler Indications:    R07.9* Chest pain, unspecified  History:        Patient has no prior history of Echocardiogram examinations.  Sonographer:    Irving BurtonEmily Senior RDCS Referring Phys: [id number] CHRISTOPHER L SCHUMANN IMPRESSIONS  1. Left ventricular ejection fraction, by estimation, is 60 to 65%. The left ventricle has normal function. The left ventricle has no regional wall motion abnormalities. Left ventricular diastolic parameters were normal.  2. Right ventricular systolic function is normal. The right ventricular size is normal. Tricuspid regurgitation signal is inadequate for assessing PA pressure.  3. The mitral valve is grossly normal. No evidence of mitral valve regurgitation. No evidence of mitral stenosis.  4. The aortic valve is tricuspid. Aortic valve regurgitation is not visualized. No aortic stenosis is present.  5. The inferior vena cava is normal in size with greater than 50% respiratory variability, suggesting right atrial pressure of 3 mmHg. Conclusion(s)/Recommendation(s): Normal biventricular function without evidence of hemodynamically significant valvular heart disease. FINDINGS  Left Ventricle: Left ventricular ejection fraction, by estimation, is 60 to 65%. The left ventricle has normal function. The left ventricle has no regional wall motion abnormalities. The left ventricular internal cavity size was normal in size. There is  no left ventricular hypertrophy. Left ventricular diastolic parameters were normal. Normal left ventricular filling pressure. Right Ventricle: The right ventricular size is normal. No increase in right ventricular wall thickness. Right ventricular systolic function is normal. Tricuspid regurgitation signal is inadequate for assessing PA pressure. Left Atrium: Left atrial size was normal in size. Right Atrium: Right atrial size was normal in size. Pericardium: Trivial pericardial effusion is present.  Mitral Valve: The mitral valve is grossly normal. No evidence of mitral valve regurgitation. No evidence of mitral valve stenosis. Tricuspid Valve: The tricuspid valve is grossly normal. Tricuspid valve regurgitation is not demonstrated. No evidence of tricuspid stenosis. Aortic Valve: The aortic valve is tricuspid. Aortic valve regurgitation is not visualized. No aortic stenosis is present. Pulmonic Valve: The pulmonic valve was grossly normal. Pulmonic valve regurgitation is not visualized. No evidence of pulmonic stenosis. Aorta: The aortic root is normal in size and structure. Venous: The right upper pulmonary vein is normal. The inferior vena cava is normal in size with greater than 50% respiratory variability, suggesting right atrial pressure of 3 mmHg. IAS/Shunts: The atrial septum is grossly normal.  LEFT VENTRICLE PLAX 2D LVIDd:         4.60 cm  Diastology LVIDs:         2.90 cm  LV e' medial:    11.40 cm/s LV PW:         0.80 cm  LV E/e' medial:  5.9 LV IVS:        0.90 cm  LV e' lateral:   13.90 cm/s LVOT diam:     2.20 cm  LV E/e' lateral: 4.9 LV SV:         78 LV SV Index:   41 LVOT Area:     3.80 cm  RIGHT VENTRICLE RV S prime:     10.30 cm/s TAPSE (M-mode): 2.1 cm LEFT ATRIUM             Index       RIGHT ATRIUM           Index LA diam:        2.40 cm 1.27 cm/m  RA Area:     15.40 cm LA Vol (  A2C):   45.7 ml 24.19 ml/m RA Volume:   41.50 ml  21.96 ml/m LA Vol (A4C):   31.9 ml 16.88 ml/m LA Biplane Vol: 41.6 ml 22.02 ml/m  AORTIC VALVE LVOT Vmax:   87.70 cm/s LVOT Vmean:  63.500 cm/s LVOT VTI:    0.205 m  AORTA Ao Root diam: 3.30 cm MITRAL VALVE MV Area (PHT): 2.73 cm    SHUNTS MV Decel Time: 278 msec    Systemic VTI:  0.20 m MV E velocity: 67.50 cm/s  Systemic Diam: 2.20 cm MV A velocity: 32.10 cm/s MV E/A ratio:  2.10 Camuy O'Neal MD Electronically signed by [place] O'Neal MD Signature Date/Time: 09/28/2020/3:28:17 PM    Final     Cardiac Studies   Echo 09/29/20: 1. Left ventricular  ejection fraction, by estimation, is 60 to 65%. The  left ventricle has normal function. The left ventricle has no regional  wall motion abnormalities. Left ventricular diastolic parameters were  normal.  2. Right ventricular systolic function is normal. The right ventricular  size is normal. Tricuspid regurgitation signal is inadequate for assessing  PA pressure.  3. The mitral valve is grossly normal. No evidence of mitral valve  regurgitation. No evidence of mitral stenosis.  4. The aortic valve is tricuspid. Aortic valve regurgitation is not  visualized. No aortic stenosis is present.  5. The inferior vena cava is normal in size with greater than 50%  respiratory variability, suggesting right atrial pressure of 3 mmHg.   Lexiscan Myoview 09/28/20: IMPRESSION: 1. Findings suspicious for inferior wall ischemia with pharmacologic stress.  2. Normal left ventricular wall motion.  3. Left ventricular ejection fraction 70%  4. Non invasive risk stratification*: Intermediate   Patient Profile     34 y.o. male with GERD and family history of CAD and chest pain after emesis.   Assessment & Plan    # Chest pain:  # Coronary calcification:  #Pure hypercholesterolemia: LAD calcification noted on chest CT.  LDL is 158.  We won't start statin given his elevated LFTs.  He would benefit from a PCSK9 inhibitor as an outpatient.  LDL goal is less than 70.  Stress test was consistent with inferior ischemia.  Calcification on his CT was in the LAD which is not consistent with this.  This may be diaphragmatic attenuation.  However we will proceed with left heart cath today to rule out obstructive heart disease.  Risks and benefits of cardiac catheterization have been discussed with the patient.  The patient understands that risks included but are not limited to stroke (1 in 1000), death (1 in 1000), kidney failure [usually temporary] (1 in 500), bleeding (1 in 200), allergic reaction  [possibly serious] (1 in 200). The patient understands and agrees to proceed.   # Pancreatic mass:  CT of the abdomen revealed a 2.9 cm mass in the pancreas.  Cannot rule out malignancy, though he also has a history of pancreatitis and pseudocyst.  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  This was due to gallstone pancreatitis.  Radiology recommend pancreatic protocol MRI. Lipase wnl.  He has a gastroenterologist and is scheduled to see them on Friday for esophageal dilation.  He will make sure to follow-up with them and have his MRI.  # Hepatic steatosis: Noted on CT.  AST/ALT both elevated, though lower than in the past.  # Pre-diabetes:  A1c is 6.1%.  He was a healthy lifestyle and exercises regularly.  His diet is pretty   good.  We will need to work on limiting carbohydrates and if A1c remains elevated recommend starting Metformin.   For questions or updates, please contact [facility] Please consult [email or web address] for contact info under        Signed, Chilton Si, MD  09/29/2020, 9:58 AM

## 2020-09-29 NOTE — Progress Notes (Signed)
Plan for left heart cath today with Dr. Swaziland. NPO please.

## 2020-10-02 DIAGNOSIS — Z1211 Encounter for screening for malignant neoplasm of colon: Secondary | ICD-10-CM | POA: Diagnosis not present

## 2020-10-02 DIAGNOSIS — R1319 Other dysphagia: Secondary | ICD-10-CM | POA: Diagnosis not present

## 2020-10-02 DIAGNOSIS — Z01818 Encounter for other preprocedural examination: Secondary | ICD-10-CM | POA: Diagnosis not present

## 2020-10-06 DIAGNOSIS — I2510 Atherosclerotic heart disease of native coronary artery without angina pectoris: Secondary | ICD-10-CM | POA: Diagnosis not present

## 2020-10-06 DIAGNOSIS — K869 Disease of pancreas, unspecified: Secondary | ICD-10-CM | POA: Diagnosis not present

## 2020-10-06 DIAGNOSIS — F324 Major depressive disorder, single episode, in partial remission: Secondary | ICD-10-CM | POA: Diagnosis not present

## 2020-10-06 DIAGNOSIS — F909 Attention-deficit hyperactivity disorder, unspecified type: Secondary | ICD-10-CM | POA: Diagnosis not present

## 2020-10-08 ENCOUNTER — Encounter: Payer: Self-pay | Admitting: Physician Assistant

## 2020-10-08 ENCOUNTER — Ambulatory Visit (INDEPENDENT_AMBULATORY_CARE_PROVIDER_SITE_OTHER): Payer: BC Managed Care – PPO | Admitting: Physician Assistant

## 2020-10-08 ENCOUNTER — Other Ambulatory Visit: Payer: Self-pay

## 2020-10-08 VITALS — BP 132/90 | HR 96 | Ht 69.0 in | Wt 166.8 lb

## 2020-10-08 DIAGNOSIS — R7303 Prediabetes: Secondary | ICD-10-CM

## 2020-10-08 DIAGNOSIS — I2510 Atherosclerotic heart disease of native coronary artery without angina pectoris: Secondary | ICD-10-CM | POA: Diagnosis not present

## 2020-10-08 DIAGNOSIS — E785 Hyperlipidemia, unspecified: Secondary | ICD-10-CM

## 2020-10-08 DIAGNOSIS — R748 Abnormal levels of other serum enzymes: Secondary | ICD-10-CM | POA: Diagnosis not present

## 2020-10-08 NOTE — Progress Notes (Signed)
Cardiology Office Note:    Date:  10/10/2020   ID:  Aaron Burke, DOB 1986/01/21, MRN [id number]  PCP:  [facility] Cardiologist:  Little Ishikawa, MD  [facility] Electrophysiologist:  None   Referring MD: [facility] Aaron Burke*   Chief Complaint  Patient presents with  . Follow-up    Seen for Dr. Bjorn Pippin    History of Present Illness:    Aaron Burke is a 34 y.o. male with a hx of hyperlipidemia, prediabetes, elevated LFT, psoriatic arthritis and GERD.  Patient was recently admitted to the hospital on 09/27/2020 due to substernal chest pain.  Blood pressure on arrival was elevated at 172/121.  He has family history of premature CAD.  Initial lab work revealed hemoglobin A1c of 6.1 which placed the patient in prediabetes range.  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  LDL was elevated at 158.  Statin was not started due to elevated LFT. Echocardiogram obtained on 09/28/2020 showed EF 60 to 65%, no significant valve issue.  Myoview obtained on the same day showed EF 70%, possible inferior wall ischemia.  Patient eventually underwent cardiac catheterization which revealed minimal nonobstructive CAD with only 20% proximal to mid LAD lesion, no significant plaquing other arteries.  EF 55 to 65% by LV gram.  Patient presents today for follow-up. He denies any chest discomfort or shortness of breath. We discussed recent hospitalization. 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 I recommend a repeat fasting lipid panel and LFT in 2 months, after that, I will likely refer the patient to Dr. Rennis Golden for treatment of hyperlipidemia. However I want to see his liver function test come down prior to that. We discussed statins versus PCSK 9 inhibitors.      Past Medical History:  Diagnosis Date  . Gallstone pancreatitis 2020  . GERD (gastroesophageal reflux disease)   . Plaque psoriasis   . Psoriatic arthritis (HCC)   . Testicular torsion 2020    Past Surgical History:  Procedure Laterality Date  . CHOLECYSTECTOMY    . ESOPHAGOGASTRODUODENOSCOPY  2020   with dilatation  . KNEE RECONSTRUCTION Left 08/30/2004  . LEFT HEART CATH AND CORONARY ANGIOGRAPHY N/A 09/29/2020   Procedure: LEFT HEART CATH AND CORONARY ANGIOGRAPHY;  Surgeon: Swaziland, Peter M, MD;  Location: [facility] INVASIVE CV LAB;  Service: Cardiovascular;  Laterality: N/A;    Current Medications: Current Meds  Medication Sig  . amphetamine-dextroamphetamine (ADDERALL XR) 30 MG 24 hr capsule Take 30 mg by mouth daily.  . DULoxetine (CYMBALTA) 30 MG capsule Take 30 mg by mouth daily.  Marland Kitchen OTEZLA 30 MG TABS Take 1 tablet by mouth 2 (two) times daily.  . pantoprazole (PROTONIX) 20 MG tablet Take 20 mg by mouth 2 (two) times daily.  . [DISCONTINUED] ADDERALL XR 30 MG 24 hr capsule Take 30 mg by mouth daily.   . [DISCONTINUED] clonazePAM (KLONOPIN) 0.5 MG tablet Take 0.5 mg by mouth as needed for anxiety.     Allergies:   Percocet [oxycodone-acetaminophen]   Social History   Socioeconomic History  . Marital status: Married    Spouse name: Not on file  . Number of children: Not on file  . Years of education: Not on file  . Highest education level: Not on  file  Occupational History  . 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 Smoking status: Never Smoker  . Smokeless tobacco: Never Used  Vaping Use  . Vaping Use: Never used  Substance and Sexual Activity  . Alcohol use: Never  . Drug use: Never  . Sexual activity: Not on file  Other Topics Concern  . Not on file  Social History Narrative   [place]/ [place] (pregnant).   Social Determinants of Health   Financial Resource Strain: Not on file  Food Insecurity: Not on  file  Transportation Needs: Not on file  Physical Activity: Not on file  Stress: Not on file  Social Connections: Not on file     Family History: The patient's family history includes Heart attack in his father and paternal grandfather; Hyperlipidemia in his mother.  ROS:   Please see the history of present illness.     All other systems reviewed and are negative.  EKGs/Labs/Other Studies Reviewed:    The following studies were reviewed today:  Echo 09/28/2020 1. Left ventricular ejection fraction, by estimation, is 60 to 65%. The  left ventricle has normal function. The left ventricle has no regional  wall motion abnormalities. Left ventricular diastolic parameters were  normal.  2. Right ventricular systolic function is normal. The right ventricular  size is normal. Tricuspid regurgitation signal is inadequate for assessing  PA pressure.  3. The mitral valve is grossly normal. No evidence of mitral valve  regurgitation. No evidence of mitral stenosis.  4. The aortic valve is tricuspid. Aortic valve regurgitation is not  visualized. No aortic stenosis is present.  5. The inferior vena cava is normal in size with greater than 50%  respiratory variability, suggesting right atrial pressure of 3 mmHg.   Conclusion(s)/Recommendation(s): Normal biventricular function without  evidence of hemodynamically significant valvular heart disease.    Cath 09/29/2020  Prox LAD to Mid LAD lesion is 20% stenosed.  The left ventricular systolic function is normal.  LV end diastolic pressure is normal.  The left ventricular ejection fraction is 55-65% by visual estimate.   1. Minimal nonobstructive CAD  2. Normal LV function 3. Normal LVEDP  Plan: risk factor modification.   EKG:  EKG is not ordered today.    Recent Labs: 09/27/2020: TSH 2.962 09/28/2020: ALT 129; BUN 6; Creatinine, Ser 0.83; Potassium 4.6; Sodium 141 09/29/2020: Hemoglobin 14.3; Platelets 169  Recent Lipid  Panel    Component Value Date/Time   CHOL 301 (H) 09/27/2020 0612   TRIG 69 09/27/2020 0612   HDL 129 09/27/2020 0612   CHOLHDL 2.3 09/27/2020 0612   VLDL 14 09/27/2020 0612   LDLCALC 158 (H) 09/27/2020 0612     Risk Assessment/Calculations:       Physical Exam:    VS:  BP 132/90 (BP Location: Left Arm, Patient Position: Sitting)   Pulse 96   Ht 5\' 9"  (1.753 m)   Wt 166 lb 12.8 oz (75.7 kg)   SpO2 96%   BMI 24.63 kg/m     Wt Readings from Last 3 Encounters:  10/08/20 166 lb 12.8 oz (75.7 kg)  09/29/20 162 lb 12.8 oz (73.8 kg)     GEN:  Well nourished, well developed in no acute distress HEENT: Normal NECK: No JVD; No carotid bruits LYMPHATICS: No lymphadenopathy CARDIAC: RRR, no murmurs, rubs, gallops RESPIRATORY:  Clear to auscultation without rales, wheezing or rhonchi  ABDOMEN: Soft, non-tender, non-distended MUSCULOSKELETAL:  No edema; No deformity  SKIN: Warm and dry NEUROLOGIC:  Alert and oriented x 3 PSYCHIATRIC:  Normal affect   ASSESSMENT:    1. Coronary artery disease involving native coronary artery of native heart without angina pectoris   2. Hyperlipidemia, unspecified hyperlipidemia type   3. Elevated liver enzymes   4. Prediabetes    PLAN:    In order of problems listed above:  1. CAD: Recent cardiac catheterization showed minimal plaque in the proximal LAD.  He does have quite significant family history of CAD and hyperlipidemia.  He will need aggressive risk factor modification  2. Hyperlipidemia: Noted during his recent hospitalization, unfortunately statin was not able to be initiated due to elevated liver enzyme.  I recommended repeat fasting lipid panel and liver function test in 66-month, after that he will see our lipid clinic  3. Elevated liver enzymes: Noted during recent hospitalization.  Repeat lab work in 2 months  4. Prediabetes: We will defer management to primary care provider.    Medication Adjustments/Labs and Tests  Ordered: Current medicines are reviewed at length with the patient today.  Concerns regarding medicines are outlined above.  Orders Placed This Encounter  Procedures  . Lipid panel  . Hepatic function panel  . AMB Referral to [facility]   No orders of the defined types were placed in this encounter.   Patient Instructions  Medication Instructions:  Your physician recommends that you continue on your current medications as directed. Please refer to the Current Medication list given to you today.  *If you need a refill on your cardiac medications before your next appointment, please call your pharmacy*  Lab Work: Your physician recommends that you return for lab work in 2 months February 2022:   Fasting Lipid Panel-DO NOT EAT OR DRINK PAST MIDNIGHT. OKAY TO HAVE WATER.  Hepatic (Liver) Function Test  If you have labs (blood work) drawn today and your tests are completely normal, you will receive your results only by: Marland Kitchen MyChart Message (if you have MyChart) OR . A paper copy in the mail If you have any lab test that is abnormal or we need to change your treatment, we will call you to review the results.  Testing/Procedures: NONE ordered at this time of appointment   Follow-Up: At [facility], you and your health needs are our priority.  As part of our continuing mission to provide you with exceptional heart care, we have created designated Provider Care Teams.  These Care Teams include your primary Cardiologist (physician) and Advanced Practice Providers (APPs -  Physician Assistants and Nurse Practitioners) who all work together to provide you with the care you need, when you need it.  We recommend signing up for the patient portal called "MyChart".  Sign up information is provided on this After Visit Summary.  MyChart is used to connect with patients for Virtual Visits (Telemedicine).  Patients are able to view lab/test results, encounter notes, upcoming  appointments, etc.  Non-urgent messages can be sent to your provider as well.   To learn more about what you can do with MyChart, go to [email or web address].    Your next appointment:   6 month(s)  The format for your next appointment:   In Person  Provider:   Epifanio Lesches, MD  Other Instructions  You have been referred to [facility]. Someone will be contacting you to get scheduled for this appointment       Signed, Azalee Course, PA  10/10/2020 6:33 PM    [facility]

## 2020-10-08 NOTE — Patient Instructions (Addendum)
Medication Instructions:  Your physician recommends that you continue on your current medications as directed. Please refer to the Current Medication list given to you today.  *If you need a refill on your cardiac medications before your next appointment, please call your pharmacy*  Lab Work: Your physician recommends that you return for lab work in 2 months February 2022:   Fasting Lipid Panel-DO NOT EAT OR DRINK PAST MIDNIGHT. OKAY TO HAVE WATER.  Hepatic (Liver) Function Test  If you have labs (blood work) drawn today and your tests are completely normal, you will receive your results only by: Marland Kitchen MyChart Message (if you have MyChart) OR . A paper copy in the mail If you have any lab test that is abnormal or we need to change your treatment, we will call you to review the results.  Testing/Procedures: NONE ordered at this time of appointment   Follow-Up: At [facility], you and your health needs are our priority.  As part of our continuing mission to provide you with exceptional heart care, we have created designated Provider Care Teams.  These Care Teams include your primary Cardiologist (physician) and Advanced Practice Providers (APPs -  Physician Assistants and Nurse Practitioners) who all work together to provide you with the care you need, when you need it.  We recommend signing up for the patient portal called "MyChart".  Sign up information is provided on this After Visit Summary.  MyChart is used to connect with patients for Virtual Visits (Telemedicine).  Patients are able to view lab/test results, encounter notes, upcoming appointments, etc.  Non-urgent messages can be sent to your provider as well.   To learn more about what you can do with MyChart, go to [email or web address].    Your next appointment:   6 month(s)  The format for your next appointment:   In Person  Provider:   Epifanio Lesches, MD  Other Instructions  You have been referred to [facility]. Someone will be contacting you to get scheduled for this appointment

## 2020-10-10 ENCOUNTER — Encounter: Payer: Self-pay | Admitting: Physician Assistant

## 2020-10-13 DIAGNOSIS — K2970 Gastritis, unspecified, without bleeding: Secondary | ICD-10-CM | POA: Diagnosis not present

## 2020-10-13 DIAGNOSIS — K900 Celiac disease: Secondary | ICD-10-CM | POA: Diagnosis not present

## 2020-10-13 DIAGNOSIS — A048 Other specified bacterial intestinal infections: Secondary | ICD-10-CM | POA: Diagnosis not present

## 2020-10-14 ENCOUNTER — Telehealth: Payer: Self-pay | Admitting: Physician Assistant

## 2020-10-14 DIAGNOSIS — K200 Eosinophilic esophagitis: Secondary | ICD-10-CM | POA: Diagnosis not present

## 2020-10-14 NOTE — Telephone Encounter (Signed)
Spoke with Dr. Barbaraann Barthel, she is concerned of Aaron Burke psychiatric status. Apparently Aaron Burke went on a 30 mile bike ride immediately after the recent cath despite strict instruction to avoid excessive exercise for 1 week after cath. He also drink quite a bit of alcohol which may explain his elevated liver enzyme.   Dr. Barbaraann Barthel wish to know if there is any harm from cardiac perspective for the patient to continue on Adderall and increase duloxetine, I think that is appropriate for this young patient without significant CAD.

## 2020-10-14 NOTE — Telephone Encounter (Signed)
Attempted to call Tif twice, previous phone number not in service. I called Dr. Barbaraann Barthel' office, Tif is on lunch break. I left my personal cell phone number for her to call me back.

## 2020-10-14 NOTE — Telephone Encounter (Signed)
Tif from Dr. Barbaraann Barthel office needs to speak with Wynema Birch about medications discussed at the patient's visit 10/08/20. Dr. Barbaraann Barthel saw the patient today and needs clarification on some orders. Please call ASAP

## 2020-10-28 ENCOUNTER — Encounter: Payer: Self-pay | Admitting: General Practice

## 2020-10-31 LAB — LIPID PANEL
Chol/HDL Ratio: 3.7 ratio (ref 0.0–5.0)
Cholesterol, Total: 319 mg/dL — ABNORMAL HIGH (ref 100–199)
HDL: 87 mg/dL (ref >39)
LDL Chol Calc (NIH): 222 mg/dL — ABNORMAL HIGH (ref 0–99)
Triglycerides: 69 mg/dL (ref 0–149)
VLDL Cholesterol Cal: 10 mg/dL (ref 5–40)

## 2020-10-31 LAB — HEPATIC FUNCTION PANEL
ALT: 66 IU/L — ABNORMAL HIGH (ref 0–44)
AST: 53 IU/L — ABNORMAL HIGH (ref 0–40)
Albumin: 4.7 g/dL (ref 4.0–5.0)
Alkaline Phosphatase: 107 IU/L (ref 44–121)
Bilirubin Total: 0.5 mg/dL (ref 0.0–1.2)
Bilirubin, Direct: 0.17 mg/dL (ref 0.00–0.40)
Total Protein: 7.4 g/dL (ref 6.0–8.5)

## 2020-11-10 ENCOUNTER — Other Ambulatory Visit: Payer: Self-pay

## 2020-11-10 DIAGNOSIS — E7800 Pure hypercholesterolemia, unspecified: Secondary | ICD-10-CM

## 2020-12-14 ENCOUNTER — Telehealth: Payer: 59 | Admitting: Internal Medicine

## 2021-04-10 NOTE — Progress Notes (Deleted)
Cardiology Office Note:    Date:  04/10/2021   ID:  Aaron Burke, DOB 1986/03/15, MRN [id number]  PCP:  Clayborn Heron, MD  [facility] Cardiologist:  Little Ishikawa, MD  [facility] Electrophysiologist:  None   Referring MD: Darrin Nipper Family M*   No chief complaint on file.   History of Present Illness:    Aaron Burke is a 35 y.o. male with a hx of hyperlipidemia, prediabetes, elevated LFT, psoriatic arthritis and GERD.  Patient was recently admitted to the hospital on 09/27/2020 due to substernal chest pain.  Blood pressure on arrival was elevated at 172/121.  He has family history of premature CAD.  Initial lab work revealed hemoglobin A1c of 6.1 which placed the patient in prediabetes range.  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  LDL was elevated at 158.  Statin was not started due to elevated LFT. Echocardiogram obtained on 09/28/2020 showed EF 60 to 65%, no significant valve issue.  Myoview obtained on the same day showed EF 70%, possible inferior wall ischemia.  Patient eventually underwent cardiac catheterization which revealed minimal nonobstructive CAD with only 20% proximal to mid LAD lesion, no significant plaque in other arteries.    Since last clinic visit,   Patient presents today for follow-up. He denies any chest discomfort or shortness of breath. We discussed recent hospitalization. 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 I recommend a repeat fasting lipid panel and LFT in 2 months, after that, I will likely refer the patient to Dr. Rennis Golden for treatment of hyperlipidemia. However I want to see his liver function test come down prior to that. We discussed statins versus PCSK 9 inhibitors.     Past Medical History:  Diagnosis Date   Gallstone  pancreatitis 2020   GERD (gastroesophageal reflux disease)    Plaque psoriasis    Psoriatic arthritis (HCC)    Testicular torsion 2020    Past Surgical History:  Procedure Laterality Date   CHOLECYSTECTOMY     ESOPHAGOGASTRODUODENOSCOPY  2020   with dilatation   KNEE RECONSTRUCTION Left 08/30/2004   LEFT HEART CATH AND CORONARY ANGIOGRAPHY N/A 09/29/2020   Procedure: LEFT HEART CATH AND CORONARY ANGIOGRAPHY;  Surgeon: Swaziland, Peter M, MD;  Location: [facility] INVASIVE CV LAB;  Service: Cardiovascular;  Laterality: N/A;    Current Medications: No outpatient medications have been marked as taking for the 04/12/21 encounter (Appointment) with Little Ishikawa, MD.     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   Social Determinants of Health   Financial Resource Strain: Not on  file  Food Insecurity: Not on file  Transportation Needs: Not on file  Physical Activity: Not on file  Stress: Not on file  Social Connections: Not on file     Family History: The patient's family history includes Heart attack in his father and paternal grandfather; Hyperlipidemia in his mother.  ROS:   Please see the history of present illness.     All other systems reviewed and are negative.  EKGs/Labs/Other Studies Reviewed:    The following studies were reviewed today:  Echo 09/28/2020 1.  Left ventricular ejection fraction, by estimation, is 60 to 65%. The  left ventricle has normal function. The left ventricle has no regional  wall motion abnormalities. Left ventricular diastolic parameters were  normal.   2. Right ventricular systolic function is normal. The right ventricular  size is normal. Tricuspid regurgitation signal is inadequate for assessing  PA pressure.   3. The mitral valve is grossly normal. No evidence of mitral valve  regurgitation. No evidence of mitral stenosis.   4. The aortic valve is tricuspid. Aortic valve regurgitation is not  visualized. No aortic stenosis is present.   5. The inferior vena cava is normal in size with greater than 50%  respiratory variability, suggesting right atrial pressure of 3 mmHg.   Conclusion(s)/Recommendation(s): Normal biventricular function without  evidence of hemodynamically significant valvular heart disease.    Cath 09/29/2020 Prox LAD to Mid LAD lesion is 20% stenosed. The left ventricular systolic function is normal. LV end diastolic pressure is normal. The left ventricular ejection fraction is 55-65% by visual estimate.   1. Minimal nonobstructive CAD  2. Normal LV function 3. Normal LVEDP   Plan: risk factor modification.   EKG:  EKG is not ordered today.    Recent Labs: 09/27/2020: TSH 2.962 09/28/2020: BUN 6; Creatinine, Ser 0.83; Potassium 4.6; Sodium 141 09/29/2020: Hemoglobin 14.3; Platelets 169 10/30/2020: ALT 66  Recent Lipid Panel    Component Value Date/Time   CHOL 319 (H) 10/30/2020 1636   TRIG 69 10/30/2020 1636   HDL 87 10/30/2020 1636   CHOLHDL 3.7 10/30/2020 1636   CHOLHDL 2.3 09/27/2020 0612   VLDL 14 09/27/2020 0612   LDLCALC 222 (H) 10/30/2020 1636     Risk Assessment/Calculations:       Physical Exam:    VS:  There were no vitals taken for this visit.    Wt Readings from Last 3 Encounters:  10/08/20 166 lb 12.8 oz (75.7 kg)  09/29/20 162 lb 12.8 oz (73.8 kg)     GEN:   Well nourished, well developed in no acute distress HEENT: Normal NECK: No JVD; No carotid bruits LYMPHATICS: No lymphadenopathy CARDIAC: RRR, no murmurs, rubs, gallops RESPIRATORY:  Clear to auscultation without rales, wheezing or rhonchi  ABDOMEN: Soft, non-tender, non-distended MUSCULOSKELETAL:  No edema; No deformity  SKIN: Warm and dry NEUROLOGIC:  Alert and oriented x 3 PSYCHIATRIC:  Normal affect   ASSESSMENT:    No diagnosis found.  PLAN:    CAD: Recent cardiac catheterization showed minimal plaque in the proximal LAD.  He does have quite significant family history of CAD and hyperlipidemia.  He will need aggressive risk factor modification  Hyperlipidemia: Noted during his recent hospitalization, unfortunately statin was not able to be initiated due to elevated liver enzyme.  LDL 222 on 10/30/20  Elevated liver enzymes: Noted during recent hospitalization.  Repeat lab work in 2 months  Prediabetes: We will defer management to primary care provider.  RTC in ***  Medication Adjustments/Labs and Tests Ordered: Current medicines are reviewed at length with the patient today.  Concerns regarding medicines are outlined above.  No orders of the defined types were placed in this encounter.  No orders of the defined types were placed in this encounter.   There are no Patient Instructions on file for this visit.   Signed, Little Ishikawa, MD  04/10/2021 2:23 PM     [facility]

## 2021-04-12 ENCOUNTER — Ambulatory Visit: Payer: 59 | Admitting: Cardiology

## 2021-04-12 NOTE — Progress Notes (Incomplete)
Cardiology Office Note:    Date:  04/12/2021   ID:  Aaron Burke, DOB 1986-01-11, MRN [id number]  PCP:  Clayborn Heron, MD  [facility] Cardiologist:  Little Ishikawa, MD  [facility] Electrophysiologist:  None   Referring MD: Darrin Nipper Family M*   No chief complaint on file.   History of Present Illness:    Aaron Burke is a 35 y.o. male with a hx of hyperlipidemia, prediabetes, elevated LFT, psoriatic arthritis and GERD.  Patient was recently admitted to the hospital on 09/27/2020 due to substernal chest pain.  Blood pressure on arrival was elevated at 172/121.  He has family history of premature CAD.  Initial lab work revealed hemoglobin A1c of 6.1 which placed the patient in prediabetes range.  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  LDL was elevated at 158.  Statin was not started due to elevated LFT. Echocardiogram obtained on 09/28/2020 showed EF 60 to 65%, no significant valve issue.  Myoview obtained on the same day showed EF 70%, possible inferior wall ischemia.  Patient eventually underwent cardiac catheterization which revealed minimal nonobstructive CAD with only 20% proximal to mid LAD lesion, no significant plaque in other arteries.    Since last clinic visit,   Patient presents today for follow-up. He denies any chest discomfort or shortness of breath. We discussed recent hospitalization. 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 I recommend a repeat fasting lipid panel and LFT in 2 months, after that, I will likely refer the patient to Dr. Rennis Golden for treatment of hyperlipidemia. However I want to see his liver function test come down prior to that. We discussed statins versus PCSK 9 inhibitors.   He denies any chest pain, shortness of breath, palpitations, or  exertional symptoms. No headaches, lightheadedness, or syncope to report. Also has no lower extremity edema, orthopnea or PND.    Past Medical History:  Diagnosis Date   Gallstone pancreatitis 2020   GERD (gastroesophageal reflux disease)    Plaque psoriasis    Psoriatic arthritis (HCC)    Testicular torsion 2020    Past Surgical History:  Procedure Laterality Date   CHOLECYSTECTOMY     ESOPHAGOGASTRODUODENOSCOPY  2020   with dilatation   KNEE RECONSTRUCTION Left 08/30/2004   LEFT HEART CATH AND CORONARY ANGIOGRAPHY N/A 09/29/2020   Procedure: LEFT HEART CATH AND CORONARY ANGIOGRAPHY;  Surgeon: Swaziland, Peter M, MD;  Location: [facility] INVASIVE CV LAB;  Service: Cardiovascular;  Laterality: N/A;    Current Medications: No outpatient medications have been marked as taking for the 04/12/21 encounter (Appointment) with Little Ishikawa, MD.     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   Social Determinants of Health   Financial Resource Strain: Not on file  Food Insecurity: Not on file  Transportation Needs: Not on file  Physical Activity: Not on file  Stress: Not on file  Social Connections: Not on file     Family History: The patient's family history includes Heart attack in his father and paternal grandfather; Hyperlipidemia in his mother.  ROS:   Please see  the history of present illness.    (+) All other systems reviewed and are negative.  EKGs/Labs/Other Studies Reviewed:    The following studies were reviewed today:  Cath 09/29/2020 Prox LAD to Mid LAD lesion is 20% stenosed. The left ventricular systolic function is normal. LV end diastolic pressure is normal. The left ventricular ejection fraction is 55-65% by visual estimate.   1. Minimal nonobstructive CAD  2. Normal LV function 3. Normal LVEDP   Plan: risk factor modification.  Echo 09/28/2020 1. Left ventricular ejection fraction, by estimation, is 60 to 65%. The  left ventricle has normal function. The left ventricle has no regional  wall motion abnormalities. Left ventricular diastolic parameters were  normal.   2. Right ventricular systolic function is normal. The right ventricular  size is normal. Tricuspid regurgitation signal is inadequate for assessing  PA pressure.   3. The mitral valve is grossly normal. No evidence of mitral valve  regurgitation. No evidence of mitral stenosis.   4. The aortic valve is tricuspid. Aortic valve regurgitation is not  visualized. No aortic stenosis is present.   5. The inferior vena cava is normal in size with greater than 50%  respiratory variability, suggesting right atrial pressure of 3 mmHg.   Conclusion(s)/Recommendation(s): Normal biventricular function without  evidence of hemodynamically significant valvular heart disease.   NM Myocar with Spect 09/28/2020: IMPRESSION: 1. Findings suspicious for inferior wall ischemia with pharmacologic stress.   2. Normal left ventricular wall motion.   3. Left ventricular ejection fraction 70%   4. Non invasive risk stratification*: Intermediate   EKG:   04/12/2021: ***   Recent Labs: 09/27/2020: TSH 2.962 09/28/2020: BUN 6; Creatinine, Ser 0.83; Potassium 4.6; Sodium 141 09/29/2020: Hemoglobin 14.3; Platelets 169 10/30/2020: ALT 66  Recent Lipid Panel    Component Value  Date/Time   CHOL 319 (H) 10/30/2020 1636   TRIG 69 10/30/2020 1636   HDL 87 10/30/2020 1636   CHOLHDL 3.7 10/30/2020 1636   CHOLHDL 2.3 09/27/2020 0612   VLDL 14 09/27/2020 0612   LDLCALC 222 (H) 10/30/2020 1636     Risk Assessment/Calculations:       Physical Exam:    VS:  There were no vitals taken for this visit.    Wt Readings from Last 3 Encounters:  10/08/20 166 lb 12.8 oz (75.7 kg)  09/29/20 162 lb 12.8 oz (73.8 kg)     GEN:  Well nourished, well developed in no acute distress HEENT: Normal NECK: No JVD; No carotid bruits LYMPHATICS: No lymphadenopathy CARDIAC: RRR, no murmurs, rubs, gallops RESPIRATORY:  Clear to auscultation without rales, wheezing or rhonchi  ABDOMEN: Soft, non-tender, non-distended MUSCULOSKELETAL:  No edema; No deformity  SKIN: Warm and dry NEUROLOGIC:  Alert and oriented x 3 PSYCHIATRIC:  Normal affect   ASSESSMENT:    No diagnosis found.  PLAN:    CAD: Recent cardiac catheterization showed minimal plaque in the proximal LAD.  He does have quite significant family history  of CAD and hyperlipidemia.  He will need aggressive risk factor modification  Hyperlipidemia: Noted during his recent hospitalization, unfortunately statin was not able to be initiated due to elevated liver enzyme.  LDL 222 on 10/30/20  Elevated liver enzymes: Noted during recent hospitalization.  Repeat lab work in 2 months  Prediabetes: We will defer management to primary care provider.  RTC in ***  Medication Adjustments/Labs and Tests Ordered: Current medicines are reviewed at length with the patient today.  Concerns regarding medicines are outlined above.  No orders of the defined types were placed in this encounter.  No orders of the defined types were placed in this encounter.   There are no Patient Instructions on file for this visit.   I,Mathew Stumpf,acting as a Neurosurgeon for Little Ishikawa, MD.,have documented all relevant documentation on the  behalf of Little Ishikawa, MD,as directed by  Little Ishikawa, MD while in the presence of Little Ishikawa, MD.  ***  Signed, Carlena Bjornstad  04/12/2021 8:12 AM    [facility]

## 2021-05-03 ENCOUNTER — Ambulatory Visit
Admission: RE | Admit: 2021-05-03 | Discharge: 2021-05-03 | Disposition: A | Discharge status: Home / Self Care | Payer: 59 | Source: Ambulatory Visit | Attending: Sports Medicine | Admitting: Sports Medicine

## 2021-05-03 ENCOUNTER — Other Ambulatory Visit: Payer: Self-pay | Admitting: Sports Medicine

## 2021-05-03 ENCOUNTER — Other Ambulatory Visit: Payer: Self-pay

## 2021-05-03 DIAGNOSIS — M25552 Pain in left hip: Secondary | ICD-10-CM

## 2022-08-07 IMAGING — CR DG CHEST 2V
2 series · 2 of 2 positions shown · non-contrast
Comparison: None.

CLINICAL DATA: Chest pain

EXAM:
CHEST - 2 VIEW

[chest pa]
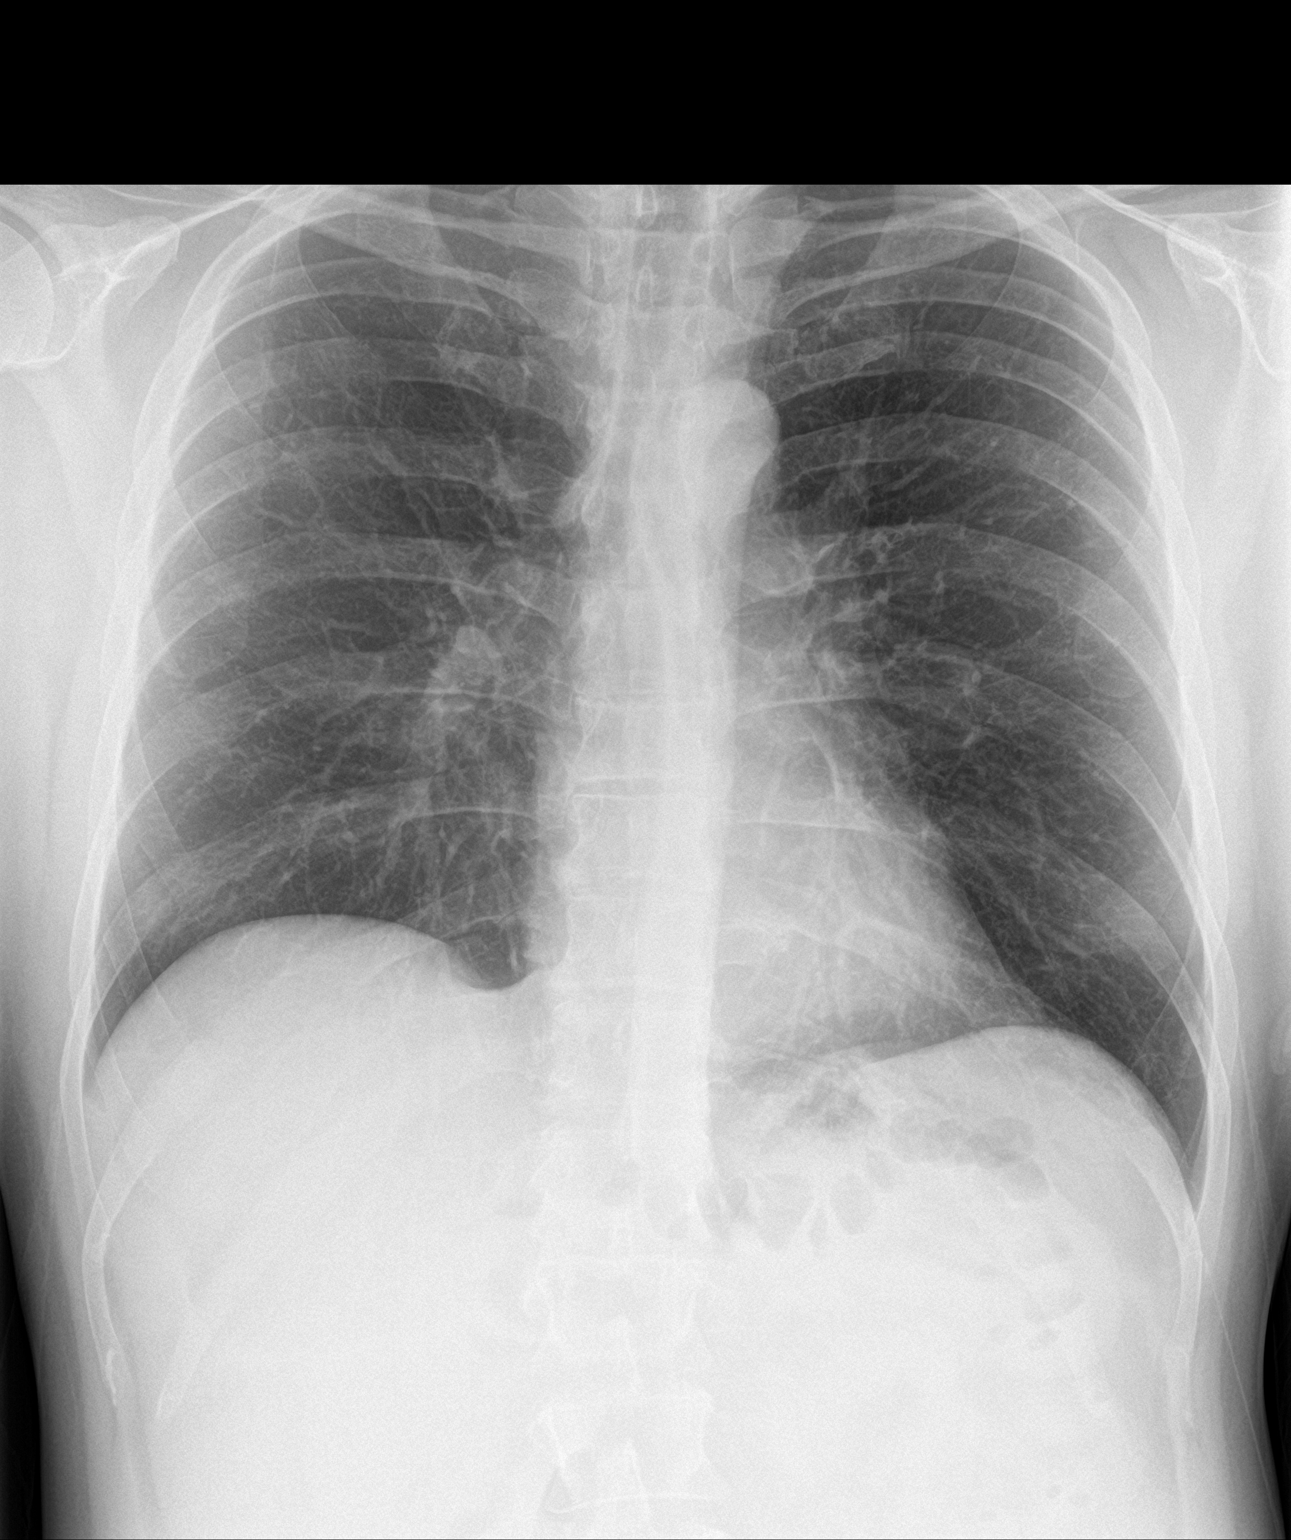

[chest lat]
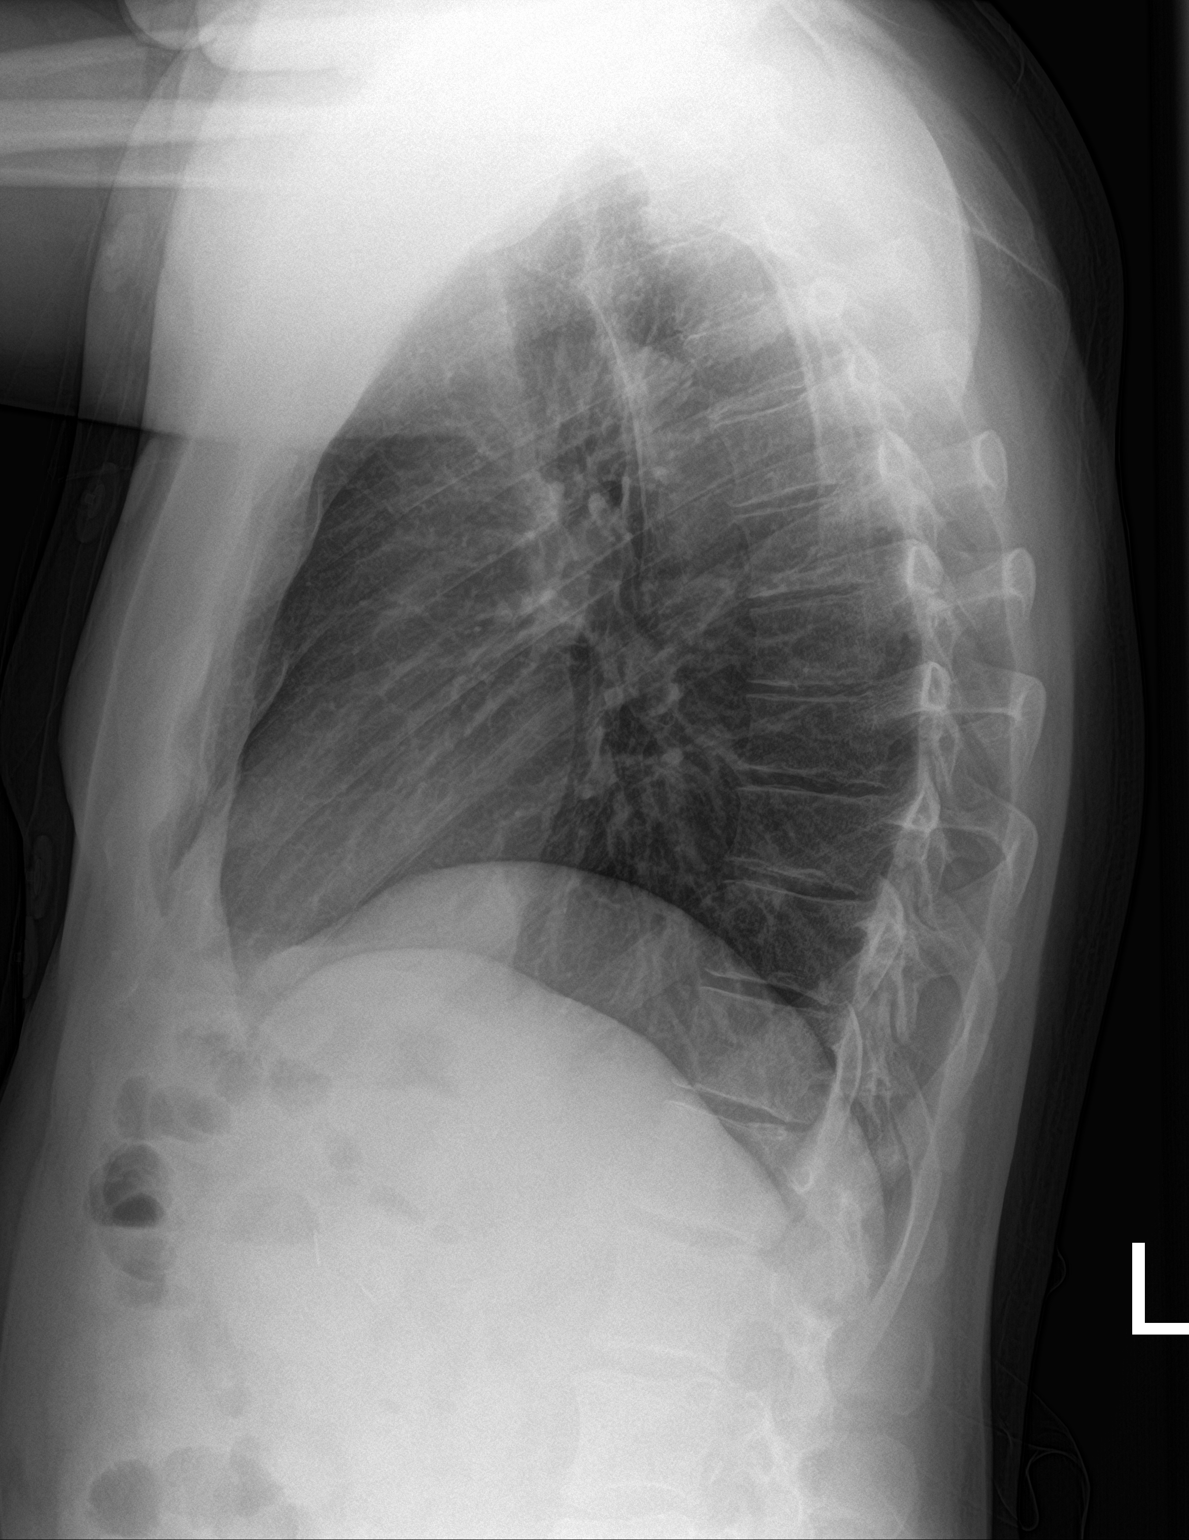

[2 of 2 positions shown; findings below may reference images not displayed]

FINDINGS: The heart size and mediastinal contours are within normal limits.
Both lungs are clear. The visualized skeletal structures are
unremarkable.
IMPRESSION: No active cardiopulmonary disease.

## 2022-08-29 ENCOUNTER — Ambulatory Visit (INDEPENDENT_AMBULATORY_CARE_PROVIDER_SITE_OTHER): Payer: 59 | Admitting: Psychiatry

## 2022-08-29 ENCOUNTER — Encounter: Payer: Self-pay | Admitting: Psychiatry

## 2022-08-29 DIAGNOSIS — F4310 Post-traumatic stress disorder, unspecified: Secondary | ICD-10-CM
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
## 2022-10-28 ENCOUNTER — Ambulatory Visit: Payer: 59 | Admitting: Psychiatry

## 2022-11-07 ENCOUNTER — Ambulatory Visit (INDEPENDENT_AMBULATORY_CARE_PROVIDER_SITE_OTHER): Payer: 59 | Admitting: Psychiatry

## 2022-11-07 DIAGNOSIS — F4310 Post-traumatic stress disorder, unspecified: Secondary | ICD-10-CM | POA: Diagnosis not present

## 2022-11-07 NOTE — Progress Notes (Signed)
      [facility] Counselor/Therapist Progress Note  Patient ID: Aaron Burke, MRN: [id number],    Date: 11/07/2022  Time Spent: 47 minutes start time 2:08 PM end time 2:55 PM  Treatment Type: Individual Therapy  Reported Symptoms: sadness, triggered responses, panic, flashbacks, sleep issues, nightmares, focusing issues, irritability, obsessive thoughts  Mental Status Exam:  Appearance:   Casual     Behavior:  Sharing  Motor:  Normal  Speech/Language:   Normal Rate  Affect:  Appropriate and Tearful  Mood:  anxious, irritable, and sad  Thought process:  normal  Thought content:    WNL  Sensory/Perceptual disturbances:    WNL  Orientation:  oriented to person, place, time/date, and situation  Attention:  Good  Concentration:  Good  Memory:  WNL  Fund of knowledge:   Good  Insight:    Good  Judgment:   Good  Impulse Control:  Good   Risk Assessment: Danger to Self:  No Self-injurious Behavior: No Danger to Others: No Duty to Warn:no Physical Aggression / Violence:No  Access to Firearms a concern: No  Gang Involvement:No   Subjective: Patient was present for session.  Patient develop treatment plan and set goals in session.  He shared that the Holidays were hard due to dad and brother not calling and it was lonely.  He just got a new C PAC machine and mask doesn't fit getting it fixed on Wednesday.  He shared that he was anxious about the session since he knew he was going to share some of his trauma history.  Patient stated the thing that was most disturbing for him was that in 2020 during [place] he had to have 12 surgeries and he almost died.  Patient explained he had gallbladder issues esophagus issues and chronic pancreatitis.  Patient explained that through all the surgeries he had to stay in the hospital himself and wear a mask which left him feeling very powerless and overwhelmed.  He shared that the hardest thing was prior to every surgery they asked if he has old living  will and  will in case something happens to him.  He shared that thinking about potentially dying would ruminate while he was laying in the hospital bed alone.  Patient shared what has triggered all this memory is that his wife stated he changed that year.  He acknowledged he had to cut off emotions and disconnect because the emotion was too much to handle.  Patient was taught grounding every exercises to use in session to help manage all that emotion that was surfacing during session.  It was agreed that processing would be started at next session.  Interventions: Solution-Oriented/Positive Psychology  Diagnosis:   ICD-10-CM   1. PTSD (post-traumatic stress disorder)  F43.10       Plan: Patient is to use coping skills to decrease triggered responses. Long term: Recall the traumatic event without becoming overwhelmed with negative emotions Short term goal: Practice, implement relaxation training as a coping mechanism for tension, panic, stress, anger, and anxiety  Lina Sayre, [facility]

## 2022-11-21 ENCOUNTER — Ambulatory Visit (INDEPENDENT_AMBULATORY_CARE_PROVIDER_SITE_OTHER): Payer: Self-pay | Admitting: Psychiatry

## 2022-11-21 DIAGNOSIS — F4310 Post-traumatic stress disorder, unspecified: Secondary | ICD-10-CM

## 2022-11-23 NOTE — Progress Notes (Signed)
Patient did not show for session

## 2022-11-24 ENCOUNTER — Other Ambulatory Visit: Payer: Self-pay | Admitting: Family Medicine

## 2022-11-24 DIAGNOSIS — N6342 Unspecified lump in left breast, subareolar: Secondary | ICD-10-CM

## 2022-12-05 ENCOUNTER — Ambulatory Visit (INDEPENDENT_AMBULATORY_CARE_PROVIDER_SITE_OTHER): Payer: 59 | Admitting: Psychiatry

## 2022-12-05 DIAGNOSIS — F4310 Post-traumatic stress disorder, unspecified: Secondary | ICD-10-CM

## 2022-12-05 NOTE — Progress Notes (Signed)
      [facility] Counselor/Therapist Progress Note  Patient ID: Aaron Burke, MRN: [id number],    Date: 12/05/2022  Time Spent: 50 minutes start time 12:04 PM end time 12:54 PM  Treatment Type: Individual Therapy  Reported Symptoms: anxiety, panic, triggered responses, sleep issues, nightmares, flashbacks  Mental Status Exam:  Appearance:   Casual and Neat     Behavior:  Appropriate  Motor:  Normal  Speech/Language:   Normal Rate  Affect:  Appropriate  Mood:  anxious  Thought process:  normal  Thought content:    WNL  Sensory/Perceptual disturbances:    WNL  Orientation:  oriented to person, place, time/date, and situation  Attention:  Good  Concentration:  Good  Memory:  WNL  Fund of knowledge:   Good  Insight:    Good  Judgment:   Good  Impulse Control:  Good   Risk Assessment: Danger to Self:  No Self-injurious Behavior: No Danger to Others: No Duty to Warn:no Physical Aggression / Violence:No  Access to Firearms a concern: No  Gang Involvement:No   Subjective: Patient was present for session. He shared that he had a cancer scare and that was hard but things are okay he doesn't have cancer.  Patient shared that he was able to visit with a friend and he was concerned about him.  Patient did processing set on hearing he had esophageal cancer, suds level 10, negative cognition "I am caged" felt hopelessness and sadness in his stomach.  Patient was able to resolve set and reduce suds level to 2.  He felt he was able to let go of the emotion that was attached to that situation.  He discussed the fact that he had disconnected when he was told that information and that bothered him.  Discussed why that probably happened and that that was probably a coping mechanism he needed to be able to survive what he had to do with surgery radiation and chemo.  Patient was encouraged to remind himself that it is over and he is move forward.  Interventions: Eye Movement Desensitization and  Reprocessing (EMDR) and Insight-Oriented  Diagnosis:   ICD-10-CM   1. PTSD (post-traumatic stress disorder)  F43.10       Plan: Patient is to use coping skills to decrease triggered responses.  Patient is to allow processing to continue over the next few weeks.  Patient is to remind himself that he can move forward from the esophageal cancer. Long term: Recall the traumatic event without becoming overwhelmed with negative emotions Short term goal: Practice, implement relaxation training as a coping mechanism for tension, panic, stress, anger, and anxiety  Lina Sayre, [facility]

## 2022-12-11 ENCOUNTER — Emergency Department (EMERGENCY_DEPARTMENT_HOSPITAL)
Admission: EM | Admit: 2022-12-11 | Discharge: 2022-12-12 | Disposition: A | Discharge status: Other Acute Inpatient Hospital | Payer: 59 | Source: Home / Self Care | Attending: Emergency Medicine | Admitting: Emergency Medicine

## 2022-12-11 ENCOUNTER — Other Ambulatory Visit: Payer: Self-pay

## 2022-12-11 DIAGNOSIS — Z87891 Personal history of nicotine dependence: Secondary | ICD-10-CM | POA: Insufficient documentation

## 2022-12-11 DIAGNOSIS — F10120 Alcohol abuse with intoxication, uncomplicated: Secondary | ICD-10-CM | POA: Insufficient documentation

## 2022-12-11 DIAGNOSIS — X838XXA Intentional self-harm by other specified means, initial encounter: Secondary | ICD-10-CM | POA: Insufficient documentation

## 2022-12-11 DIAGNOSIS — Y908 Blood alcohol level of 240 mg/100 ml or more: Secondary | ICD-10-CM | POA: Insufficient documentation

## 2022-12-11 DIAGNOSIS — T1491XA Suicide attempt, initial encounter: Secondary | ICD-10-CM | POA: Diagnosis not present

## 2022-12-11 DIAGNOSIS — F4310 Post-traumatic stress disorder, unspecified: Secondary | ICD-10-CM | POA: Insufficient documentation

## 2022-12-11 DIAGNOSIS — F332 Major depressive disorder, recurrent severe without psychotic features: Secondary | ICD-10-CM | POA: Diagnosis not present

## 2022-12-11 DIAGNOSIS — Z1152 Encounter for screening for COVID-19: Secondary | ICD-10-CM | POA: Insufficient documentation

## 2022-12-11 DIAGNOSIS — T424X2A Poisoning by benzodiazepines, intentional self-harm, initial encounter: Secondary | ICD-10-CM | POA: Diagnosis not present

## 2022-12-11 DIAGNOSIS — F10129 Alcohol abuse with intoxication, unspecified: Secondary | ICD-10-CM | POA: Diagnosis present

## 2022-12-11 LAB — COMPREHENSIVE METABOLIC PANEL
ALT: 91 U/L — ABNORMAL HIGH (ref 0–44)
AST: 77 U/L — ABNORMAL HIGH (ref 15–41)
Albumin: 4.5 g/dL (ref 3.5–5.0)
Alkaline Phosphatase: 93 U/L (ref 38–126)
Anion gap: 14 (ref 5–15)
BUN: 7 mg/dL (ref 6–20)
CO2: 24 mmol/L (ref 22–32)
Calcium: 9.2 mg/dL (ref 8.9–10.3)
Chloride: 101 mmol/L (ref 98–111)
Creatinine, Ser: 0.83 mg/dL (ref 0.61–1.24)
GFR, Estimated: >60 mL/min (ref >60)
Glucose, Bld: 161 mg/dL — ABNORMAL HIGH (ref 70–99)
Potassium: 3.8 mmol/L (ref 3.5–5.1)
Sodium: 139 mmol/L (ref 135–145)
Total Bilirubin: 0.4 mg/dL (ref 0.3–1.2)
Total Protein: 8.3 g/dL — ABNORMAL HIGH (ref 6.5–8.1)

## 2022-12-11 LAB — LIPASE, BLOOD: Lipase: 29 U/L (ref 11–51)

## 2022-12-11 LAB — RESP PANEL BY RT-PCR (RSV, FLU A&B, COVID)  RVPGX2
Influenza A by PCR: NEGATIVE
Influenza B by PCR: NEGATIVE
Resp Syncytial Virus by PCR: NEGATIVE
SARS Coronavirus 2 by RT PCR: NEGATIVE

## 2022-12-11 LAB — CBC WITH DIFFERENTIAL/PLATELET
Abs Immature Granulocytes: 0.02 10*3/uL (ref 0.00–0.07)
Basophils Absolute: 0.1 10*3/uL (ref 0.0–0.1)
Basophils Relative: 1 %
Eosinophils Absolute: 0.2 10*3/uL (ref 0.0–0.5)
Eosinophils Relative: 3 %
HCT: 51.6 % (ref 39.0–52.0)
Hemoglobin: 17.1 g/dL — ABNORMAL HIGH (ref 13.0–17.0)
Immature Granulocytes: 0 %
Lymphocytes Relative: 33 %
Lymphs Abs: 2.5 10*3/uL (ref 0.7–4.0)
MCH: 28.9 pg (ref 26.0–34.0)
MCHC: 33.1 g/dL (ref 30.0–36.0)
MCV: 87.3 fL (ref 80.0–100.0)
Monocytes Absolute: 0.3 10*3/uL (ref 0.1–1.0)
Monocytes Relative: 4 %
Neutro Abs: 4.5 10*3/uL (ref 1.7–7.7)
Neutrophils Relative %: 59 %
Platelets: 219 10*3/uL (ref 150–400)
RBC: 5.91 MIL/uL — ABNORMAL HIGH (ref 4.22–5.81)
RDW: 13.4 % (ref 11.5–15.5)
WBC: 7.5 10*3/uL (ref 4.0–10.5)
nRBC: 0 % (ref 0.0–0.2)

## 2022-12-11 LAB — ETHANOL: Alcohol, Ethyl (B): 275 mg/dL — ABNORMAL HIGH (ref <10)

## 2022-12-11 MED ORDER — ZIPRASIDONE MESYLATE 20 MG IM SOLR
INTRAMUSCULAR | Status: AC
  Administered 2022-12-11: 20 mg via INTRAMUSCULAR
  Filled 2022-12-11: qty 20

## 2022-12-11 MED ORDER — ZIPRASIDONE MESYLATE 20 MG IM SOLR: 20.0000 mg | Freq: Once | INTRAMUSCULAR | Status: AC
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
## 2022-12-11 NOTE — ED Notes (Signed)
External male catheter applied by this nurse and Lily RN. Pt educated on use

## 2022-12-11 NOTE — ED Notes (Addendum)
Restraints discontinued at 2300, pt resting on stretcher asleep at this time. Dr Oswald Hillock made aware
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
## 2022-12-11 NOTE — ED Notes (Signed)
PT. Belongings are in the cabinet label patient belonging 23-25 he has 2 bags
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
## 2022-12-11 NOTE — BH Assessment (Signed)
Per ED staff, Pt is currently in restraints and unable to participate in tele-assessment.   Evelena Peat, [facility], [facility] Triage Specialist [phone]

## 2022-12-11 NOTE — ED Notes (Signed)
Contacted Poison control. Pt stated he "Took 2 Adderall 10 mg and not tramadol"

## 2022-12-11 NOTE — ED Triage Notes (Signed)
Pt BIBA from home by [employer] and EMS. Pt texted wife "I'll be dead in an hour", pt also told young son "this is the last day we will spend together".  Pt said they took bottle of tramadol, EMS unable to determine how much. Pt refused EMS vitals.   Currently voluntary.  Hx ETOH abuse, and seizures from withdrawal. Hx [employer] according to wife

## 2022-12-12 ENCOUNTER — Inpatient Hospital Stay (HOSPITAL_COMMUNITY)
Admission: AD | Admit: 2022-12-12 | Discharge: 2022-12-20 | DRG: 885 | Disposition: A | Discharge status: Home / Self Care | Payer: 59 | Source: Intra-hospital | Attending: Psychiatry | Admitting: Psychiatry

## 2022-12-12 DIAGNOSIS — F411 Generalized anxiety disorder: Secondary | ICD-10-CM | POA: Diagnosis present

## 2022-12-12 DIAGNOSIS — K5900 Constipation, unspecified: Secondary | ICD-10-CM | POA: Diagnosis present

## 2022-12-12 DIAGNOSIS — F603 Borderline personality disorder: Secondary | ICD-10-CM | POA: Diagnosis present

## 2022-12-12 DIAGNOSIS — K219 Gastro-esophageal reflux disease without esophagitis: Secondary | ICD-10-CM | POA: Diagnosis present

## 2022-12-12 DIAGNOSIS — K30 Functional dyspepsia: Secondary | ICD-10-CM | POA: Diagnosis present

## 2022-12-12 DIAGNOSIS — F909 Attention-deficit hyperactivity disorder, unspecified type: Secondary | ICD-10-CM | POA: Diagnosis present

## 2022-12-12 DIAGNOSIS — Z20822 Contact with and (suspected) exposure to covid-19: Secondary | ICD-10-CM | POA: Diagnosis present

## 2022-12-12 DIAGNOSIS — Z79899 Other long term (current) drug therapy: Secondary | ICD-10-CM | POA: Diagnosis not present

## 2022-12-12 DIAGNOSIS — L4050 Arthropathic psoriasis, unspecified: Secondary | ICD-10-CM | POA: Diagnosis present

## 2022-12-12 DIAGNOSIS — F10129 Alcohol abuse with intoxication, unspecified: Secondary | ICD-10-CM | POA: Diagnosis present

## 2022-12-12 DIAGNOSIS — T424X2A Poisoning by benzodiazepines, intentional self-harm, initial encounter: Secondary | ICD-10-CM | POA: Diagnosis present

## 2022-12-12 DIAGNOSIS — Z818 Family history of other mental and behavioral disorders: Secondary | ICD-10-CM

## 2022-12-12 DIAGNOSIS — T1491XA Suicide attempt, initial encounter: Secondary | ICD-10-CM | POA: Diagnosis present

## 2022-12-12 DIAGNOSIS — F332 Major depressive disorder, recurrent severe without psychotic features: Secondary | ICD-10-CM | POA: Diagnosis present

## 2022-12-12 DIAGNOSIS — T50902A Poisoning by unspecified drugs, medicaments and biological substances, intentional self-harm, initial encounter: Secondary | ICD-10-CM | POA: Diagnosis present

## 2022-12-12 DIAGNOSIS — F10120 Alcohol abuse with intoxication, uncomplicated: Secondary | ICD-10-CM | POA: Diagnosis present

## 2022-12-12 DIAGNOSIS — F1010 Alcohol abuse, uncomplicated: Secondary | ICD-10-CM | POA: Diagnosis present

## 2022-12-12 DIAGNOSIS — G4700 Insomnia, unspecified: Secondary | ICD-10-CM | POA: Diagnosis present

## 2022-12-12 DIAGNOSIS — F4310 Post-traumatic stress disorder, unspecified: Secondary | ICD-10-CM | POA: Insufficient documentation

## 2022-12-12 DIAGNOSIS — Z87891 Personal history of nicotine dependence: Secondary | ICD-10-CM

## 2022-12-12 DIAGNOSIS — F1090 Alcohol use, unspecified, uncomplicated: Secondary | ICD-10-CM | POA: Diagnosis present

## 2022-12-12 DIAGNOSIS — Z781 Physical restraint status: Secondary | ICD-10-CM

## 2022-12-12 LAB — URINALYSIS, ROUTINE W REFLEX MICROSCOPIC
Bacteria, UA: NONE SEEN
Bilirubin Urine: NEGATIVE
Glucose, UA: NEGATIVE mg/dL
Hgb urine dipstick: NEGATIVE
Ketones, ur: NEGATIVE mg/dL
Leukocytes,Ua: NEGATIVE
Nitrite: NEGATIVE
Protein, ur: 30 mg/dL — AB
Specific Gravity, Urine: 1.02 (ref 1.005–1.030)
pH: 5 (ref 5.0–8.0)

## 2022-12-12 LAB — RAPID URINE DRUG SCREEN, HOSP PERFORMED
Amphetamines: POSITIVE — AB
Barbiturates: NOT DETECTED
Benzodiazepines: POSITIVE — AB
Cocaine: NOT DETECTED
Opiates: NOT DETECTED
Tetrahydrocannabinol: NOT DETECTED

## 2022-12-12 LAB — ACETAMINOPHEN LEVEL: Acetaminophen (Tylenol), Serum: <10 ug/mL — ABNORMAL LOW (ref 10–30)

## 2022-12-12 MED ORDER — HYDROXYZINE HCL 25 MG PO TABS
25.0000 mg | ORAL_TABLET | Freq: Four times a day (QID) | ORAL | Status: DC | PRN
  Administered 2022-12-13 – 2022-12-15 (×4): 25 mg via ORAL
  Filled 2022-12-12 (×4): qty 1

## 2022-12-12 MED ORDER — LORAZEPAM 1 MG PO TABS
1.0000 mg | ORAL_TABLET | Freq: Two times a day (BID) | ORAL | Status: AC
  Administered 2022-12-15 (×2): 1 mg via ORAL
  Filled 2022-12-12: qty 1

## 2022-12-12 MED ORDER — ADULT MULTIVITAMIN W/MINERALS CH
1.0000 | ORAL_TABLET | Freq: Every day | ORAL | Status: DC
  Administered 2022-12-13 – 2022-12-20 (×8): 1 via ORAL
  Filled 2022-12-12 (×9): qty 1

## 2022-12-12 MED ORDER — CLONIDINE HCL 0.1 MG PO TABS
0.1000 mg | ORAL_TABLET | Freq: Once | ORAL | Status: AC
  Administered 2022-12-12: 0.1 mg via ORAL
  Filled 2022-12-12 (×2): qty 1

## 2022-12-12 MED ORDER — VITAMIN B-1 100 MG PO TABS
100.0000 mg | ORAL_TABLET | Freq: Every day | ORAL | Status: DC
  Administered 2022-12-13 – 2022-12-20 (×8): 100 mg via ORAL
  Filled 2022-12-12 (×9): qty 1

## 2022-12-12 MED ORDER — DULOXETINE HCL 30 MG PO CPEP
30.0000 mg | ORAL_CAPSULE | Freq: Every day | ORAL | Status: DC
  Administered 2022-12-12 – 2022-12-14 (×3): 30 mg via ORAL
  Filled 2022-12-12 (×6): qty 1

## 2022-12-12 MED ORDER — MAGNESIUM HYDROXIDE 400 MG/5ML PO SUSP: 30.0000 mL | Freq: Every day | ORAL | Status: DC | PRN

## 2022-12-12 MED ORDER — THIAMINE HCL 100 MG/ML IJ SOLN: 100.0000 mg | Freq: Once | INTRAMUSCULAR | Status: DC

## 2022-12-12 MED ORDER — ALUM & MAG HYDROXIDE-SIMETH 200-200-20 MG/5ML PO SUSP
30.0000 mL | ORAL | Status: DC | PRN
  Administered 2022-12-17: 30 mL via ORAL
  Filled 2022-12-12: qty 30

## 2022-12-12 MED ORDER — LORAZEPAM 1 MG PO TABS
1.0000 mg | ORAL_TABLET | ORAL | Status: AC | PRN
  Administered 2022-12-14: 1 mg via ORAL

## 2022-12-12 MED ORDER — LORAZEPAM 1 MG PO TABS
1.0000 mg | ORAL_TABLET | Freq: Three times a day (TID) | ORAL | Status: AC
  Administered 2022-12-14 (×3): 1 mg via ORAL
  Filled 2022-12-12 (×3): qty 1

## 2022-12-12 MED ORDER — LOPERAMIDE HCL 2 MG PO CAPS: 2.0000 mg | ORAL_CAPSULE | ORAL | Status: AC | PRN

## 2022-12-12 MED ORDER — LORAZEPAM 1 MG PO TABS
1.0000 mg | ORAL_TABLET | Freq: Four times a day (QID) | ORAL | Status: AC
  Administered 2022-12-12 – 2022-12-13 (×4): 1 mg via ORAL
  Filled 2022-12-12 (×3): qty 1

## 2022-12-12 MED ORDER — LORAZEPAM 1 MG PO TABS
1.0000 mg | ORAL_TABLET | Freq: Four times a day (QID) | ORAL | Status: AC | PRN
  Filled 2022-12-12 (×3): qty 1

## 2022-12-12 MED ORDER — ZIPRASIDONE MESYLATE 20 MG IM SOLR: 20.0000 mg | INTRAMUSCULAR | Status: DC | PRN

## 2022-12-12 MED ORDER — CLONIDINE HCL 0.1 MG PO TABS
0.1000 mg | ORAL_TABLET | Freq: Two times a day (BID) | ORAL | Status: DC | PRN
  Administered 2022-12-14 – 2022-12-19 (×5): 0.1 mg via ORAL
  Filled 2022-12-12 (×5): qty 1

## 2022-12-12 MED ORDER — ONDANSETRON 4 MG PO TBDP
4.0000 mg | ORAL_TABLET | Freq: Four times a day (QID) | ORAL | Status: AC | PRN
  Administered 2022-12-13: 4 mg via ORAL
  Filled 2022-12-12: qty 1

## 2022-12-12 MED ORDER — LORAZEPAM 1 MG PO TABS
1.0000 mg | ORAL_TABLET | Freq: Every day | ORAL | Status: AC
  Administered 2022-12-16: 1 mg via ORAL
  Filled 2022-12-12: qty 1

## 2022-12-12 MED ORDER — QUETIAPINE FUMARATE 25 MG PO TABS
25.0000 mg | ORAL_TABLET | Freq: Every day | ORAL | Status: DC
  Administered 2022-12-12 – 2022-12-13 (×2): 25 mg via ORAL
  Filled 2022-12-12 (×5): qty 1

## 2022-12-12 MED ORDER — OLANZAPINE 5 MG PO TBDP
5.0000 mg | ORAL_TABLET | Freq: Three times a day (TID) | ORAL | Status: DC | PRN
  Administered 2022-12-14 – 2022-12-17 (×4): 5 mg via ORAL
  Filled 2022-12-12 (×4): qty 1

## 2022-12-12 NOTE — ED Notes (Signed)
Pt escorted back to [facility] unit, belongings placed in locker 73.

## 2022-12-12 NOTE — ED Provider Notes (Signed)
Emergency Medicine Observation Re-evaluation Note  Aaron Burke is a 37 y.o. male, seen on rounds today.  Pt initially presented to the ED for complaints of Suicide Attempt Currently, the patient is resting.  Physical Exam  BP 121/78   Pulse (!) 107   Temp 97.8 F (36.6 C) (Oral)   Resp 16   Ht 1.753 m (5' 9"$ )   Wt 75.7 kg   SpO2 100%   BMI 24.65 kg/m  Physical Exam General: No acute distress Cardiac: Mild tachycardia this morning Lungs:  Psych:   ED Course / MDM  EKG:EKG Interpretation  Date/Time:  Sunday December 11 2022 17:45:25 EST Ventricular Rate:  111 PR Interval:  159 QRS Duration: 98 QT Interval:  337 QTC Calculation: 458 R Axis:   47 Text Interpretation: Sinus tachycardia Low voltage, precordial leads Confirmed by Tretha Sciara [phone]) on 12/11/2022 6:27:23 PM  I have reviewed the labs performed to date as well as medications administered while in observation.  Recent changes in the last 24 hours include removal of restraints.  Plan  Current plan is for initial TTS eval.  Patient is no longer in restraints this morning.    Dorie Rank, MD 12/12/22 [phone]

## 2022-12-12 NOTE — ED Notes (Signed)
Pt woken up and told his lunch was here.

## 2022-12-12 NOTE — Progress Notes (Signed)
Pt was accepted to [facility] 12/12/2022, pending IVC paperwork faxed to [phone]. Bed assignment: [id number]  Pt meets inpatient criteria per Vesta Mixer, NP  Attending Physician will be Janine Limbo, MD  Report can be called to: - Adult unit: [phone]  Pt can arrive after 1800  Care Team Notified: [facility] Lynnda Shields, RN, Marzetta Merino, Paramedic, Charmaine Downs, NP, Vesta Mixer, NP, April Wilson, Paramedic, and Will Hilma Favors, RN  [place], [place]  12/12/2022 1:05 PM

## 2022-12-12 NOTE — ED Notes (Addendum)
Faxed IVC paperwork to [facility]

## 2022-12-12 NOTE — Consult Note (Signed)
[place] ED ASSESSMENT   Reason for Consult:  SA Referring Physician:  Dr. Oswald Hillock Patient Identification: Aaron Burke MRN:  [id number] ED Chief Complaint: Suicide attempt [facility])  Diagnosis:  Principal Problem:   Suicide attempt [facility]) Active Problems:   Alcohol abuse with intoxication ([place])   PTSD (post-traumatic stress disorder)   ED Assessment Time Calculation: Start Time: 1100 Stop Time: 1145 Total Time in Minutes (Assessment Completion): 45   HPI:   Aaron Burke is a 37 y.o. male patient who originally was brought to [facility] via EMS after texting wife "I'll be dead in an hour" and telling son "this is the last day we will spend together." Pt reportedly took bottle of tramadol, ems unable to determine how much. Pt agitated, refusing EMS vitals.   Pt then reported he had only took two 10 mg Adderalls and not tramadol yesterday evening, however today he is reporting he took 7 Lorazepam.   "Patient allegedly overdosed on multiple medications in a attempt to kill himself.  Patient's wife states that she was told he took an entire bottle of tramadol. He allegedly texted her that he was ready to end it all and told his son that this would be their last day together. He endorses alcohol use today. He is denying substance ingestion to me. However when asked for more history, he endorsed that he has nothing to live for and that he would plan to kill himself using cables in the room before talking to our psychiatrist.  He has been placed on suicide precautions in the emergency room pending psychiatric evaluation."  Pt did become agitated, difficult to verbally deescalate and required physical restraints. He has been out of restraints, calm, and cooperative since 2200 on 12/11/22.   Subjective:   Pt seen at [facility] for face to face evaluation. Pt is irritable, but willing to cooperative with assessment. Pt reports history of borderline personality disorder, anxiety, PTSD, and ADHD. He currently  sees a therapist 2x per month. Pt explains to me he and his wife have been having marriage troubles and did not want to go into any details. They are currently in the process of being separated. Ultimately patient stated yesterday his wife "kicked him out of the house" yesterday which is what caused the overdose. Pt stated "I didn't try and kill myself. And my cunt wife doesn't know what she is talking about I never took any tramadol. I took 7 lorazepams to just try and pass out and forget about everything going on. I just wanted a break. I did text my wife that I think to just say it. But I texted my son it was our last day together because it was. She was kicking me out of the house I wasn't going to die."   Today patient is denying SI, no suicidal plans or intent. Pt denies HI. Denies AVH. Pt is minimal and guarded during assessment, asks many times to be discharged home. Pt became very agitated when talking about this wife, and has a lot of feelings of hatred/anger towards her currently. Pt blaming his wife for being here and "sending him to the loony bin." I did explain to patient he is currently under IVC, and will be recommended for inpatient psychiatric treatment. After education on the process, pt is not happy with his disposition plan but understanding. He does express fears/concerns about first inpatient psychiatric experience. Pt currently being reviewed by [facility].   Past Psychiatric History:  PTSD, borderline personality disorder, anxiety, ADHD  Risk to Self or Others: Is the patient at risk to self? Yes Has the patient been a risk to self in the past 6 months? Yes Has the patient been a risk to self within the distant past? No Is the patient a risk to others? Yes Has the patient been a risk to others in the past 6 months? No Has the patient been a risk to others within the distant past? No   Past Medical History:  Past Medical History:  Diagnosis Date   Gallstone pancreatitis 2020    GERD (gastroesophageal reflux disease)    Plaque psoriasis    Psoriatic arthritis ([place])    Testicular torsion 2020    Past Surgical History:  Procedure Laterality Date   CHOLECYSTECTOMY     ESOPHAGOGASTRODUODENOSCOPY  2020   with dilatation   KNEE RECONSTRUCTION Left 08/30/2004   LEFT HEART CATH AND CORONARY ANGIOGRAPHY N/A 09/29/2020   Procedure: LEFT HEART CATH AND CORONARY ANGIOGRAPHY;  Surgeon: Martinique, Peter M, MD;  Location: [place] [facility];  Service: Cardiovascular;  Laterality: N/A;   Family History:  Family History  Problem Relation Age of Onset   Hyperlipidemia Mother    Heart attack Father        x 2, total 4 stents   Heart attack Paternal Grandfather    Bipolar disorder Other    Social History:  Social History   Substance and Sexual Activity  Alcohol Use Never     Social History   Substance and Sexual Activity  Drug Use Never    Social History   Socioeconomic History   Marital status: Married    Spouse name: Not on file   Number of children: Not on file   Years of education: Not on file   Highest education level: Not on file  Occupational History   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   Social Determinants of Health   Financial Resource Strain: Not on file  Food Insecurity: Not on file  Transportation Needs: Not on file  Physical Activity: Not on file  Stress: Not on file  Social Connections: Not on file    Allergies:   Allergies  Allergen Reactions   Percocet [Oxycodone-Acetaminophen] Other (See Comments)    "makes me feel uncomfortable"    Labs:  Results for  orders placed or performed during the hospital encounter of 12/11/22 (from the past 48 hour(s))  Ethanol     Status: Abnormal   Collection Time: 12/11/22  5:53 PM  Result Value Ref Range   Alcohol, Ethyl (B) 275 (H) <10 mg/dL    Comment: (NOTE) Lowest detectable limit for serum alcohol is 10 mg/dL.  For medical purposes only. Performed at [facility], [place] [address] [place],  [address]   CBC with Differential     Status: Abnormal   Collection Time: 12/11/22  5:53 PM  Result Value Ref Range   WBC 7.5 4.0 - 10.5 K/uL   RBC 5.91 (H) 4.22 - 5.81 MIL/uL   Hemoglobin 17.1 (H) 13.0 -  17.0 g/dL   HCT 51.6 39.0 - 52.0 %   MCV 87.3 80.0 - 100.0 fL   MCH 28.9 26.0 - 34.0 pg   MCHC 33.1 30.0 - 36.0 g/dL   RDW 13.4 11.5 - 15.5 %   Platelets 219 150 - 400 K/uL   nRBC 0.0 0.0 - 0.2 %   Neutrophils Relative % 59 %   Neutro Abs 4.5 1.7 - 7.7 K/uL   Lymphocytes Relative 33 %   Lymphs Abs 2.5 0.7 - 4.0 K/uL   Monocytes Relative 4 %   Monocytes Absolute 0.3 0.1 - 1.0 K/uL   Eosinophils Relative 3 %   Eosinophils Absolute 0.2 0.0 - 0.5 K/uL   Basophils Relative 1 %   Basophils Absolute 0.1 0.0 - 0.1 K/uL   Immature Granulocytes 0 %   Abs Immature Granulocytes 0.02 0.00 - 0.07 K/uL    Comment: Performed at [facility], [place] [address] [place], [place] [address]  Lipase, blood     Status: None   Collection Time: 12/11/22  5:53 PM  Result Value Ref Range   Lipase 29 11 - 51 U/L    Comment: Performed at [facility], [place] [address], [place], [place] [address]  Comprehensive metabolic panel     Status: Abnormal   Collection Time: 12/11/22  5:53 PM  Result Value Ref Range   Sodium 139 135 - 145 mmol/L   Potassium 3.8 3.5 - 5.1 mmol/L   Chloride 101 98 - 111 mmol/L   CO2 24 22 - 32 mmol/L   Glucose, Bld 161 (H) 70 - 99 mg/dL    Comment: Glucose reference range applies only to samples taken after fasting for at least 8 hours.    BUN 7 6 - 20 mg/dL   Creatinine, Ser 0.83 0.61 - 1.24 mg/dL   Calcium 9.2 8.9 - 10.3 mg/dL   Total Protein 8.3 (H) 6.5 - 8.1 g/dL   Albumin 4.5 3.5 - 5.0 g/dL   AST 77 (H) 15 - 41 U/L   ALT 91 (H) 0 - 44 U/L   Alkaline Phosphatase 93 38 - 126 U/L   Total Bilirubin 0.4 0.3 - 1.2 mg/dL   GFR, Estimated >60 >60 mL/min    Comment: (NOTE) Calculated using the CKD-EPI Creatinine Equation (2021)    Anion gap 14 5 - 15    Comment: Performed at [facility], [place] [address], [place], [place] [address]  Acetaminophen level     Status: Abnormal   Collection Time: 12/11/22  5:53 PM  Result Value Ref Range   Acetaminophen (Tylenol), Serum <10 (L) 10 - 30 ug/mL    Comment: (NOTE) Therapeutic concentrations vary significantly. A range of 10-30 ug/mL  may be an effective concentration for many patients. However, some  are best treated at concentrations outside of this range. Acetaminophen concentrations >150 ug/mL at 4 hours after ingestion  and >50 ug/mL at 12 hours after ingestion are often associated with  toxic reactions.  Performed at [facility], [place] [address], [place], [place] [address]   Resp panel by RT-PCR (RSV, Flu A&B, Covid) Anterior Nasal Swab     Status: None   Collection Time: 12/11/22  6:37 PM   Specimen: Anterior Nasal Swab  Result Value Ref Range   SARS Coronavirus 2 by RT PCR NEGATIVE NEGATIVE    Comment: (NOTE) SARS-CoV-2 target nucleic acids are NOT DETECTED.  The SARS-CoV-2 RNA is generally detectable in upper respiratory  specimens during the acute phase of infection. The lowest concentration of SARS-CoV-2 viral copies this assay can detect is 138 copies/mL. A negative result does not preclude SARS-Cov-2 infection and should not be used as the sole basis for treatment or other patient management decisions. A negative result may occur with  improper specimen collection/handling, submission of specimen other than nasopharyngeal  swab, presence of viral mutation(s) within the areas targeted by this assay, and inadequate number of viral copies(<138 copies/mL). A negative result must be combined with clinical observations, patient history, and epidemiological information. The expected result is Negative.  Fact Sheet for Patients:  EntrepreneurPulse.com.au  Fact Sheet for Healthcare Providers:  IncredibleEmployment.be  This test is no t yet approved or cleared by the [place] FDA and  has been authorized for detection and/or diagnosis of SARS-CoV-2 by FDA under an Emergency Use Authorization (EUA). This EUA will remain  in effect (meaning this test can be used) for the duration of the COVID-19 declaration under Section 564(b)(1) of the Act, 21 U.S.C.section 360bbb-3(b)(1), unless the authorization is terminated  or revoked sooner.       Influenza A by PCR NEGATIVE NEGATIVE   Influenza B by PCR NEGATIVE NEGATIVE    Comment: (NOTE) The Xpert Xpress SARS-CoV-2/FLU/RSV plus assay is intended as an aid in the diagnosis of influenza from Nasopharyngeal swab specimens and should not be used as a sole basis for treatment. Nasal washings and aspirates are unacceptable for Xpert Xpress SARS-CoV-2/FLU/RSV testing.  Fact Sheet for Patients: EntrepreneurPulse.com.au  Fact Sheet for Healthcare Providers: IncredibleEmployment.be  This test is not yet approved or cleared by the [place] FDA and has been authorized for detection and/or diagnosis of SARS-CoV-2 by FDA under an Emergency Use Authorization (EUA). This EUA will remain in effect (meaning this test can be used) for the duration of the COVID-19 declaration under Section 564(b)(1) of the Act, 21 U.S.C. section 360bbb-3(b)(1), unless the authorization is terminated or revoked.     Resp Syncytial Virus by PCR NEGATIVE NEGATIVE    Comment: (NOTE) Fact Sheet for  Patients: EntrepreneurPulse.com.au  Fact Sheet for Healthcare Providers: IncredibleEmployment.be  This test is not yet approved or cleared by the [place] FDA and has been authorized for detection and/or diagnosis of SARS-CoV-2 by FDA under an Emergency Use Authorization (EUA). This EUA will remain in effect (meaning this test can be used) for the duration of the COVID-19 declaration under Section 564(b)(1) of the Act, 21 U.S.C. section 360bbb-3(b)(1), unless the authorization is terminated or revoked.  Performed at [facility], [place] [address] [place], [place] [address]     No current facility-administered medications for this encounter.   Current Outpatient Medications  Medication Sig Dispense Refill   amphetamine-dextroamphetamine (ADDERALL XR) 30 MG 24 hr capsule Take 30 mg by mouth daily.     DULoxetine (CYMBALTA) 30 MG capsule Take 30 mg by mouth daily.     OTEZLA 30 MG TABS Take 1 tablet by mouth 2 (two) times daily.     pantoprazole (PROTONIX) 20 MG tablet Take 20 mg by mouth 2 (two) times daily.     Psychiatric Specialty Exam: Presentation  General Appearance:  Fairly Groomed  Eye Contact: Good  Speech: Clear and Coherent  Speech Volume: Normal  Handedness: Right   Mood and Affect  Mood: Irritable; Anxious  Affect: Congruent   Thought Process  Thought Processes: Coherent  Descriptions of Associations:Intact  Orientation:Full (Time, Place and Person)  Thought Content:Logical  History of Schizophrenia/Schizoaffective  disorder:No data recorded Duration of Psychotic Symptoms:No data recorded Hallucinations:Hallucinations: None  Ideas of Reference:None  Suicidal Thoughts:Suicidal Thoughts: No  Homicidal Thoughts:Homicidal Thoughts: No   Sensorium  Memory: Immediate Fair; Recent Fair  Judgment: Fair  Insight: Fair   Community education officer   Concentration: Good  Attention Span: Good  Recall: Good  Fund of Knowledge: Good  Language: Good   Psychomotor Activity  Psychomotor Activity: Psychomotor Activity: Normal   Assets  Assets: Desire for Improvement; Leisure Time; Resilience; Physical Health    Sleep  Sleep: Sleep: Fair   Physical Exam: Physical Exam Neurological:     Mental Status: He is alert and oriented to person, place, and time.  Psychiatric:        Attention and Perception: Attention normal.        Mood and Affect: Mood is anxious. Affect is angry.        Speech: Speech normal.        Behavior: Behavior is cooperative.        Thought Content: Thought content normal.    Review of Systems  Psychiatric/Behavioral:  Positive for depression, substance abuse and suicidal ideas. The patient is nervous/anxious.        Agitation   Blood pressure (!) 145/96, pulse 99, temperature 98.3 F (36.8 C), temperature source Oral, resp. rate 16, height 5' 9"$  (1.753 m), weight 75.7 kg, SpO2 95 %. Body mass index is 24.65 kg/m.  Medical Decision Making: Pt case reviewed and discussed with Dr. Leverne Humbles. Pt meets criteria for IVC and inpatient psychiatric treatment. Pt currently being reviewed by [facility]. CSW notified to fax out if no availability.   - continue outpatient medications   Disposition:  inpatient psychiatric treatment  Vesta Mixer, NP 12/12/2022 11:22 AM

## 2022-12-12 NOTE — Progress Notes (Signed)
The patient verbalized in group that he didn't have a very good day since he slept quite a bit. He states that no one spoke with him today and that he doesn't understand why he is in the hospital. He was unable to share anything positive about his day.

## 2022-12-13 ENCOUNTER — Encounter (HOSPITAL_COMMUNITY): Payer: Self-pay | Admitting: Nurse Practitioner

## 2022-12-13 ENCOUNTER — Other Ambulatory Visit: Payer: Self-pay

## 2022-12-13 DIAGNOSIS — F411 Generalized anxiety disorder: Secondary | ICD-10-CM | POA: Diagnosis present

## 2022-12-13 DIAGNOSIS — F332 Major depressive disorder, recurrent severe without psychotic features: Principal | ICD-10-CM | POA: Diagnosis present

## 2022-12-13 DIAGNOSIS — F1090 Alcohol use, unspecified, uncomplicated: Secondary | ICD-10-CM | POA: Diagnosis present

## 2022-12-13 DIAGNOSIS — F909 Attention-deficit hyperactivity disorder, unspecified type: Secondary | ICD-10-CM | POA: Diagnosis present

## 2022-12-13 DIAGNOSIS — K219 Gastro-esophageal reflux disease without esophagitis: Secondary | ICD-10-CM | POA: Diagnosis present

## 2022-12-13 MED ORDER — QUETIAPINE FUMARATE 50 MG PO TABS: 50.0000 mg | ORAL_TABLET | Freq: Every day | ORAL | Status: DC

## 2022-12-13 MED ORDER — QUETIAPINE FUMARATE 50 MG PO TABS
50.0000 mg | ORAL_TABLET | Freq: Every day | ORAL | Status: DC
  Administered 2022-12-13: 50 mg via ORAL
  Filled 2022-12-13 (×4): qty 1

## 2022-12-13 NOTE — H&P (Signed)
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 UA WNL, with exception of protein of 30.  CMP with elevated LFTs, CBC reviewed, EtOH level 275.  Labs ordered: TSH, hemoglobin A1c, lipid panel, vitamins B1, B12, vitamin D.  Repeat LFTs.  EKG with QTc of 458.  PLAN Safety and Monitoring: Voluntary admission to inpatient psychiatric unit for safety, stabilization and treatment Daily contact with patient to assess and evaluate symptoms and progress in treatment Patient's case to be discussed in multi-disciplinary team meeting Observation Level  : q15 minute checks Vital signs: q12 hours Precautions: Safety  Long Term Goal(s): Improvement in symptoms so as ready for discharge  Short Term Goals: Ability to identify changes in lifestyle to reduce recurrence of condition will improve, Ability to disclose and discuss suicidal ideas, Ability to demonstrate self-control will improve, Ability to identify and develop effective coping behaviors will improve, Ability to maintain clinical measurements within normal limits will improve, Compliance with prescribed medications will improve, and Ability to identify triggers associated with substance abuse/mental health issues will improve  Diagnoses Principal Problem:   MDD (major depressive disorder), recurrent severe, without psychosis ([place]) Active Problems:   Alcohol use disorder   GERD (gastroesophageal reflux disease)   Attention deficit hyperactivity disorder (ADHD)   GAD (generalized anxiety disorder)  Medications: -Increase Seroquel to 50 mg nightly for mood stabilization -Continue Ativan detox protocol for EtOH abuse -Continue to monitor withdrawals via CIWA scores -Continue hydroxyzine 25 mg 3 times daily as needed for anxiety -Continue clonidine 0.1 mg twice daily as needed for SBP over 160 or DBP over 100 -Continue Cymbalta 30 mg daily for depressive symptoms (home medication) Patient educated on rationales, benefits, possible side effects of all medications as listed above and verbalizes understanding of all.  Other PRNS -Continue Tylenol 650 mg every 6 hours PRN for mild pain -Continue Maalox 30 mg every 4 hrs PRN for indigestion -Continue Imodium 2-4 mg as needed for diarrhea -Continue Milk  of Magnesia as needed every 6 hrs for constipation -Continue Zofran disintegrating tabs every 6 hrs PRN for nausea   Discharge Planning: Social work and case management to assist with discharge planning and identification of hospital follow-up needs prior to discharge Estimated LOS: 5-7  days Discharge Concerns: Need to establish a safety plan; Medication compliance and effectiveness Discharge Goals: Return home with outpatient referrals for mental health follow-up including medication management/psychotherapy  I certify that inpatient services furnished can reasonably be expected to improve the patient's condition.    Nicholes Rough, NP 2/20/20245:48 PM

## 2022-12-13 NOTE — Progress Notes (Signed)
Late Entry/Admission Note:  Patient refused his admission assessment yesterday until this morning. Patient reports he was having anxiety attacks and tired, and requested to be left alone. On assessment this morning, patient stated "I took like 7 Ativan and my wife thought I was killing myself, and I was not, she is leaving me."  Patient refused to give more details about his suicide attempt. Patient was irritable, agitated and anxious. Patient reports using 3 bottles of beer daily, and chewing tobacco. He reports his anxiety a 8/10, patient denies current SI, HI, and AVH.  Patient verbally contracts for safety. Patient reports pain  7/10 on his groin area. Patient reports he wants to work on "stress relief and calmness".  He reports his stressors include job and family issues. Patient's skin assessment was completed by previous shift, and personal belongings were searched and locked up in the locker. Patient is resting in his room at this time, Q 15 minutes safety checks on going.
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
## 2022-12-13 NOTE — Tx Team (Signed)
Initial Treatment Plan 12/13/2022 6:41 AM Aaron Burke [id number]    PATIENT STRESSORS: Financial difficulties   Marital or family conflict   Medication change or noncompliance     PATIENT STRENGTHS: Capable of independent living  Camera operator means  Work skills    PATIENT IDENTIFIED PROBLEMS: Depression  Anxiety  " Stress relief"  " Calmness"               DISCHARGE CRITERIA:  Ability to meet basic life and health needs Improved stabilization in mood, thinking, and/or behavior Medical problems require only outpatient monitoring Motivation to continue treatment in a less acute level of care  PRELIMINARY DISCHARGE PLAN: Attend PHP/IOP Outpatient therapy Return to previous living arrangement Return to previous work or school arrangements  PATIENT/FAMILY INVOLVEMENT: This treatment plan has been presented to and reviewed with the patient, Aaron Burke, and/or family member.  The patient and family have been given the opportunity to ask questions and make suggestions.  Wolfgang Phoenix, RN 12/13/2022, 6:41 AM

## 2022-12-13 NOTE — BHH Group Notes (Signed)
Adult Psychoeducational Group Note  Date:  12/13/2022 Time:  12:25 PM  Group Topic/Focus:  Goals Group:   The focus of this group is to help patients establish daily goals to achieve during treatment and discuss how the patient can incorporate goal setting into their daily lives to aide in recovery.  Participation Level:  Did Not Attend  Participation Quality:   Did not attend  Affect:   Did not attend  Cognitive:   Did not attend  Insight: None  Engagement in Group:   Did not attend  Modes of Intervention:   Did not attend  Additional Comments:  Pt did not attend goal group due being in bed.  Christeen Lai, Georgiann Mccoy 12/13/2022, 12:25 PM

## 2022-12-13 NOTE — Progress Notes (Signed)
   12/13/22 [id number]  Psych Admission Type (Psych Patients Only)  Admission Status Voluntary  Psychosocial Assessment  Patient Complaints Agitation;Anxiety;Anger;Worrying;Irritability  Eye Contact Avoids  Facial Expression Flat  Affect Sad  Speech Logical/coherent  Interaction Cautious;Guarded;Minimal  Motor Activity Slow  Appearance/Hygiene Unremarkable  Behavior Characteristics Appropriate to situation  Mood Depressed;Irritable  Thought Process  Coherency WDL  Content Blaming others  Delusions None reported or observed  Perception WDL  Hallucination None reported or observed  Judgment Poor  Confusion None  Danger to Self  Current suicidal ideation? Passive  Agreement Not to Harm Self Yes  Description of Agreement Verbal  Danger to Others  Danger to Others None reported or observed

## 2022-12-13 NOTE — Progress Notes (Signed)
Adult Psychoeducational Group Note  Date:  12/13/2022 Time:  10:25 PM  Group Topic/Focus:  Wrap-Up Group:   The focus of this group is to help patients review their daily goal of treatment and discuss progress on daily workbooks.  Participation Level:  Did Not Attend  Participation Quality:   did not attend  Affect:   did not attend  Cognitive:   did not attend  Insight: None  Engagement in Group:   did not attend  Modes of Intervention:   did not atten  Additional Comments:  did not attend  Debe Coder 12/13/2022, 10:25 PM

## 2022-12-13 NOTE — Group Note (Signed)
Recreation Therapy Group Note   Group Topic:Animal Assisted Therapy   Group Date: 12/13/2022 Start Time: 1430 End Time: 1500 Facilitators: Damek Ende-McCall, LRT,CTRS Location: [address] Dayroom   Animal-Assisted Activity (AAA) Program Checklist/Progress Notes Patient Eligibility Criteria Checklist & Daily Group note for Rec Tx Intervention  AAA/T Program Assumption of Risk Form signed by Patient/ or Parent Legal Guardian Yes  Patient understands his/her participation is voluntary Yes   Affect/Mood: N/A   Participation Level: Did not attend    Clinical Observations/Individualized Feedback:     Plan: Continue to engage patient in RT group sessions 2-3x/week.   Kaitlin Alcindor-McCall, LRT,CTRS  12/13/2022 4:26 PM

## 2022-12-13 NOTE — Plan of Care (Signed)
  Problem: Education: Goal: Emotional status will improve Outcome: Progressing Goal: Mental status will improve Outcome: Progressing   

## 2022-12-13 NOTE — Group Note (Signed)
LCSW Group Therapy Note   Group Date: 12/13/2022 Start Time: 1100 End Time: 1200  Type of Group and Topic: Psychoeducational Group: Discharge Planning  Participation Level: Did not attend   Description of Group Discharge planning group reviews patient's anticipated discharge plans and assists patients to anticipate and address any barriers to wellness/recovery in the community. Suicide prevention education is reviewed with patients in group. Patients will receive a worksheet of the 5W's (Who, What, When, Where, How).  The Pt will discuss supports, coping skills, and their motivations for change.   Therapeutic Goals  1. Patients will state their anticipated discharge plan and mental health aftercare  2. Patients will identify potential barriers to wellness in the community setting  3. Patients will engage in problem solving, solution focused discussion of ways to anticipate and address barriers to wellness/recovery  Summary of Patient Progress: Did not attend   Darleen Crocker, LCSWA 12/13/2022  1:25 PM
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
## 2022-12-13 NOTE — [facility] Suicide Risk Assessment (Signed)
Suicide Risk Assessment  Admission Assessment    [facility] Admission Suicide Risk Assessment   Nursing information obtained from:  Patient Demographic factors:  Divorced or widowed, Male, Caucasian Current Mental Status:  Suicidal ideation indicated by others Loss Factors:  Loss of significant relationship Historical Factors:  Impulsivity Risk Reduction Factors:  Sense of responsibility to family, Living with another person, especially a relative  Total Time spent with patient: 1.5 hours Principal Problem: MDD (major depressive disorder), recurrent severe, without psychosis ([place]) Diagnosis:  Principal Problem:   MDD (major depressive disorder), recurrent severe, without psychosis ([place]) Active Problems:   Alcohol use disorder   GERD (gastroesophageal reflux disease)   Attention deficit hyperactivity disorder (ADHD)   GAD (generalized anxiety disorder)  Subjective Data: CC:"My wife is leaving and taking my son. I took 7 [employer]."   Continued Clinical Symptoms: Mood is depressed, irritable, angry, patient lacks insight and judgment into her current presentation.  He currently presents as a danger to self due to current mental status.  Continued hospitalization is necessary for treatment and stabilization.  Alcohol Use Disorder Identification Test Final Score (AUDIT): 3 The "Alcohol Use Disorders Identification Test", Guidelines for Use in Primary Care, Second Edition.  World Pharmacologist [facility]). Score between 0-7:  no or low risk or alcohol related problems. Score between 8-15:  moderate risk of alcohol related problems. Score between 16-19:  high risk of alcohol related problems. Score 20 or above:  warrants further diagnostic evaluation for alcohol dependence and treatment.  CLINICAL FACTORS:   Depression:   Anhedonia Hopelessness Impulsivity Insomnia Recent sense of peace/wellbeing Severe  Musculoskeletal: Strength & Muscle Tone: within normal limits Gait & Station:  normal Patient leans: N/A  Psychiatric Specialty Exam:  Presentation  General Appearance:  Appropriate for Environment; Fairly Groomed  Eye Contact: Good  Speech: Clear and Coherent  Speech Volume: Normal  Handedness: Right   Mood and Affect  Mood: Anxious; Depressed; Angry; Irritable  Affect: Congruent   Thought Process  Thought Processes: Coherent  Descriptions of Associations:Intact  Orientation:Full (Time, Place and Person)  Thought Content:Logical; WDL  History of Schizophrenia/Schizoaffective disorder:No data recorded Duration of Psychotic Symptoms:No data recorded Hallucinations:Hallucinations: None  Ideas of Reference:None  Suicidal Thoughts:Suicidal Thoughts: No  Homicidal Thoughts:Homicidal Thoughts: No   Sensorium  Memory: Immediate Good  Judgment: Poor  Insight: Poor   Executive Functions  Concentration: Poor  Attention Span: Poor  Recall: Sullivan of Knowledge: Fair  Language: Fair   Psychomotor Activity  Psychomotor Activity: Psychomotor Activity: Normal   Assets  Assets: Communication Skills   Sleep  Sleep: Sleep: Poor    Physical Exam: Physical Exam Constitutional:      Appearance: Normal appearance.  HENT:     Head: Normocephalic.     Nose: Nose normal.  Neurological:     Mental Status: He is alert and oriented to person, place, and time.    Review of Systems  Constitutional: Negative.   Eyes: Negative.   Respiratory: Negative.    Cardiovascular: Negative.   Gastrointestinal: Negative.   Genitourinary: Negative.   Musculoskeletal: Negative.   Skin: Negative.   Neurological: Negative.   Psychiatric/Behavioral:  Positive for depression and substance abuse. Negative for hallucinations, memory loss and suicidal ideas. The patient is nervous/anxious and has insomnia.    Blood pressure 120/78, pulse 85, temperature 98.3 F (36.8 C), temperature source Oral, resp. rate 18, height 5' 9"$   (1.753 m), weight 80 kg, SpO2 94 %. Body mass index is 26.05 kg/m.  COGNITIVE FEATURES THAT CONTRIBUTE TO RISK:  None    SUICIDE RISK:   Mild:  There are no identifiable suicide plans, no associated intent, mild dysphoria and related symptoms, good self-control (both objective and subjective assessment), few other risk factors, and identifiable protective factors, including available and accessible social support.   PLAN OF CARE: See H & P  I certify that inpatient services furnished can reasonably be expected to improve the patient's condition.   Nicholes Rough, NP 12/13/2022, 5:51 PM

## 2022-12-13 NOTE — Progress Notes (Signed)
   12/13/22 0900  Charting Type  Charting Type Shift assessment  Safety Check Verification  Has the RN verified the 15 minute safety check completion? Yes  Neurological  Neuro (WDL) WDL  HEENT  HEENT (WDL) X (No changes)  Respiratory  Respiratory (WDL) WDL  Cardiac  Cardiac (WDL) WDL  Vascular  Vascular (WDL) WDL  Integumentary  Integumentary (WDL) X (No changes)  Braden Scale (Ages 8 and up)  Sensory Perceptions 4  Moisture 4  Activity 4  Mobility 4  Nutrition 3  Friction and Shear 3  Braden Scale Score 22  Musculoskeletal  Musculoskeletal (WDL) WDL  Gastrointestinal  Gastrointestinal (WDL) WDL  GU Assessment  Genitourinary (WDL) WDL  Neurological  Level of Consciousness Alert

## 2022-12-13 NOTE — Progress Notes (Signed)
   12/13/22 0630  15 Minute Checks  Location Bedroom  Visual Appearance Calm  Behavior Sleeping  Sleep (Behavioral Health Patients Only)  Calculate sleep? (Click Yes once per 24 hr at 0600 safety check) Yes  Documented sleep last 24 hours 8.5

## 2022-12-14 ENCOUNTER — Encounter (HOSPITAL_COMMUNITY): Payer: Self-pay | Admitting: Nurse Practitioner

## 2022-12-14 ENCOUNTER — Encounter (HOSPITAL_COMMUNITY): Payer: Self-pay

## 2022-12-14 LAB — TSH: TSH: 1.322 u[IU]/mL (ref 0.350–4.500)

## 2022-12-14 LAB — HEPATIC FUNCTION PANEL
ALT: 91 U/L — ABNORMAL HIGH (ref 0–44)
AST: 83 U/L — ABNORMAL HIGH (ref 15–41)
Albumin: 4.3 g/dL (ref 3.5–5.0)
Alkaline Phosphatase: 90 U/L (ref 38–126)
Bilirubin, Direct: 0.2 mg/dL (ref 0.0–0.2)
Indirect Bilirubin: 0.9 mg/dL (ref 0.3–0.9)
Total Bilirubin: 1.1 mg/dL (ref 0.3–1.2)
Total Protein: 7.7 g/dL (ref 6.5–8.1)

## 2022-12-14 LAB — LIPID PANEL
Cholesterol: 292 mg/dL — ABNORMAL HIGH (ref 0–200)
HDL: 77 mg/dL (ref >40)
LDL Cholesterol: 195 mg/dL — ABNORMAL HIGH (ref 0–99)
Total CHOL/HDL Ratio: 3.8 RATIO
Triglycerides: 98 mg/dL (ref <150)
VLDL: 20 mg/dL (ref 0–40)

## 2022-12-14 LAB — VITAMIN B12: Vitamin B-12: 252 pg/mL (ref 180–914)

## 2022-12-14 LAB — VITAMIN D 25 HYDROXY (VIT D DEFICIENCY, FRACTURES): Vit D, 25-Hydroxy: 12.49 ng/mL — ABNORMAL LOW (ref 30–100)

## 2022-12-14 LAB — HEMOGLOBIN A1C
Hgb A1c MFr Bld: 6.1 % — ABNORMAL HIGH (ref 4.8–5.6)
Mean Plasma Glucose: 128.37 mg/dL

## 2022-12-14 MED ORDER — QUETIAPINE FUMARATE 25 MG PO TABS
25.0000 mg | ORAL_TABLET | Freq: Two times a day (BID) | ORAL | Status: DC
  Administered 2022-12-14 – 2022-12-20 (×12): 25 mg via ORAL
  Filled 2022-12-14 (×15): qty 1

## 2022-12-14 MED ORDER — NICOTINE 14 MG/24HR TD PT24
14.0000 mg | MEDICATED_PATCH | Freq: Every day | TRANSDERMAL | Status: DC
  Administered 2022-12-14 – 2022-12-18 (×5): 14 mg via TRANSDERMAL
  Filled 2022-12-14 (×10): qty 1

## 2022-12-14 MED ORDER — QUETIAPINE FUMARATE ER 50 MG PO TB24
100.0000 mg | ORAL_TABLET | Freq: Every day | ORAL | Status: DC
  Administered 2022-12-15 – 2022-12-18 (×4): 100 mg via ORAL
  Filled 2022-12-14 (×10): qty 2

## 2022-12-14 MED ORDER — NAPROXEN 500 MG PO TABS
500.0000 mg | ORAL_TABLET | Freq: Two times a day (BID) | ORAL | Status: DC
  Administered 2022-12-14 – 2022-12-15 (×2): 500 mg via ORAL
  Filled 2022-12-14 (×7): qty 1

## 2022-12-14 NOTE — Plan of Care (Signed)
  Problem: Education: Goal: Knowledge of [facility] Education information/materials will improve Outcome: Progressing Goal: Mental status will improve Outcome: Progressing   Problem: Coping: Goal: Will verbalize feelings Outcome: Progressing   Problem: Self-Concept: Goal: Ability to identify factors that promote anxiety will improve Outcome: Progressing Goal: Level of anxiety will decrease Outcome: Progressing

## 2022-12-14 NOTE — Progress Notes (Signed)
   12/14/22 2300  Psych Admission Type (Psych Patients Only)  Admission Status Voluntary  Psychosocial Assessment  Patient Complaints Depression;Anxiety  Eye Contact Fair  Facial Expression Flat  Affect Anxious  Speech Logical/coherent  Interaction Cautious;Guarded  Motor Activity Slow  Appearance/Hygiene Unremarkable  Behavior Characteristics Cooperative;Anxious  Mood Depressed;Anxious  Thought Process  Coherency WDL  Content WDL  Delusions None reported or observed  Perception WDL  Hallucination None reported or observed  Judgment Poor  Confusion None  Danger to Self  Current suicidal ideation? Denies  Self-Injurious Behavior No self-injurious ideation or behavior indicators observed or expressed   Agreement Not to Harm Self Yes  Description of Agreement Verbal  Danger to Others  Danger to Others None reported or observed  Danger to Others Abnormal  Harmful Behavior to others No threats or harm toward other people  Destructive Behavior No threats or harm toward property

## 2022-12-14 NOTE — Progress Notes (Signed)
Psychoeducational Group Note  Date:  12/14/2022 Time:  2150  Group Topic/Focus:  Relapse Prevention Planning:   The focus of this group is to define relapse and discuss the need for planning to combat relapse.  Participation Level: Did Not Attend  Participation Quality:  Not Applicable  Affect:  Not Applicable  Cognitive:  Not Applicable  Insight:  Not Applicable  Engagement in Group: Not Applicable  Additional Comments:  The patient did not attend the evening group.   Gennette Pac 12/14/2022, 9:49 PM

## 2022-12-14 NOTE — [facility] Counselor (Signed)
CSW spoke with the patient during treatment team and the patient states that his belongings did not follow him from [facility] to [facility].  He states that he lost his shoes, pants, and phone during this transition.  CSW contacted [facility] security and spoke with a Animal nutritionist that states that he will look for the patient's belongings and will contact CSW back once the items are found.  [phone] for security.

## 2022-12-14 NOTE — Progress Notes (Signed)
[facility] MD Progress Note  12/14/2022 4:42 PM Hardie Clamp  MRN:  [id number] Principal Problem: MDD (major depressive disorder), recurrent severe, without psychosis ([place]) Diagnosis: Principal Problem:   MDD (major depressive disorder), recurrent severe, without psychosis ([place]) Active Problems:   Alcohol use disorder   GERD (gastroesophageal reflux disease)   Attention deficit hyperactivity disorder (ADHD)   GAD (generalized anxiety disorder)  Reason For Admission: Aaron Burke is a 37 yo Caucasian male with a reported past mental health history of borderline personality disorder, GAD & ADHD who was taken to the [facility] ED on 02/18 by the [facility] police dept and EMS after he made suicidal statements to his wife and child, and overdose on pills in the context of relationship stressors.  Patient was subsequently involuntarily committed and transferred to this behavioral health hospital for treatment and stabilization of his mental status.   24 hr chart review: Vital signs for the past 24 hours within normal limits.  Patient is compliant with scheduled medications.  He required Zyprexa 5 mg today afternoon for agitation. Required hydroxyzine 25 mg today afternoon for anxiety.  As per nursing flow sheets, he slept for 11.75 hours last night.  Present patient over the past 24 hours as per nursing documentation has been anxious, depressed.  Patient assessment note: Pt presents with a flat affect and mood is irritable, angry and depressed. His attention to personal hygiene and grooming is poor, eye contact is fair, speech is clear & coherent. Thought contents are organized and logical, and pt currently denies SI/HI/AVH or paranoia. There is no evidence of delusional thoughts.    Patient is continuing to display poor insight, poor judgment, continues to focus on the need to be discharged, does not think that he needs mental health treatment, even though mood remains irritable, angry and depressed.   Patient has been educated that average length of stay is between 5 and 7 days, but could be longer based on symptoms and response to treatment.  Today, patient reports that his pants to show bluish discoloration to his right groin area, and states that he was "rough handled" by police while at the ED, prior to transportation to this hospital.  He complains of pain in this area, also complains of pain to his left shoulder area.  He is ambulating with a steady gait, and has good range of motion to bilateral upper extremities.  Naproxen 500 mg as needed will be ordered for patient as needed for pain to that area.  Patient reports that he stopped taking Cymbalta 2 to 3 months ago because of lack of effective.  He states that he does not want to take this medication anymore.  We are discontinuing this medication.  Patient is agreeable to continuing Seroquel nightly, with some doses during the day for mood stabilization. Nightly Seroquel will be changed to 100 mg XR, and we will schedule 25 mg twice daily during the day for mood stabilization.  We are continuing other medications as listed below.  Pt reports a poor appetite, states that he is not feeling hungry, educated on alternative food choices at the hospital. Educated on the need to also drink fluids. He reports a good sleep quality last night.   Patient's verbal consent obtained, his wife called at [phone] [employer] Sells), to find out how many guns patient owns and if he has access to them.  Wife stated that patient has 4 guns at home, and 3 of them are locked up and one of them is  not.  She also added that patient will have no access to the home once discharged because she is changing the locks to the home, and that she has no restraining order on the patient currently.  Wife also states that she has already relayed this information to patient, and that they had a phone conversation today. She states that she told pt that he is not allowed back to the home.   She states that the initial plan was for her to leave for a week, so that patient can retrieve his belongings, but she thinks that it is best if she just changes the locks of the home so patient has no access. She states that pt should be kept at the hospital until next Monday due to concerns for her safety so that she can change the locks of the home, and she is requesting that pt's treatment team notify her prior to pt being discharged.  Total Time spent with patient: 45 minutes  Past Psychiatric History: Borderline personality d/o, ADHD  Past Medical History:  Past Medical History:  Diagnosis Date   Gallstone pancreatitis 2020   GERD (gastroesophageal reflux disease)    Plaque psoriasis    Psoriatic arthritis ([place])    Testicular torsion 2020    Past Surgical History:  Procedure Laterality Date   CHOLECYSTECTOMY     ESOPHAGOGASTRODUODENOSCOPY  2020   with dilatation   KNEE RECONSTRUCTION Left 08/30/2004   LEFT HEART CATH AND CORONARY ANGIOGRAPHY N/A 09/29/2020   Procedure: LEFT HEART CATH AND CORONARY ANGIOGRAPHY;  Surgeon: Martinique, Peter M, MD;  Location: [facility];  Service: Cardiovascular;  Laterality: N/A;   Family History:  Family History  Problem Relation Age of Onset   Hyperlipidemia Mother    Heart attack Father        x 2, total 4 stents   Heart attack Paternal Grandfather    Bipolar disorder Other    Family Psychiatric  History: See H & P Social History:  Social History   Substance and Sexual Activity  Alcohol Use Never     Social History   Substance and Sexual Activity  Drug Use Never    Social History   Socioeconomic History   Marital status: Married    Spouse name: Not on file   Number of children: Not on file   Years of education: Not on file   Highest education level: Not on file  Occupational History   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   Social Determinants of Health   Financial Resource Strain: Not on file  Food Insecurity: No Food Insecurity (12/13/2022)   Hunger Vital Sign    Worried About Running Out of Food in the Last Year: Never true    Ran Out of Food in the Last Year: Never true  Transportation Needs: Unknown (12/13/2022)   PRAPARE - Hydrologist (Medical): Patient refused    Lack of Transportation (Non-Medical): Patient refused  Physical Activity: Not on file  Stress: Not on  file  Social Connections: Not on file   Sleep: Good  Appetite:  Poor  Current Medications: Current Facility-Administered Medications  Medication Dose Route Frequency Provider Last Rate Last Admin   alum & mag hydroxide-simeth (MAALOX/MYLANTA) 200-200-20 MG/5ML suspension 30 mL  30 mL Oral Q4H PRN Charmaine Downs C, NP       cloNIDine (CATAPRES) tablet 0.1 mg  0.1 mg Oral BID PRN Nicholes Rough, NP   0.1 mg at 12/14/22 [id number]   hydrOXYzine (ATARAX) tablet 25 mg  25 mg Oral Q6H PRN Nicholes Rough, NP   25 mg at 12/14/22 1418   loperamide (IMODIUM) capsule 2-4 mg  2-4 mg Oral PRN Nicholes Rough, NP       LORazepam (ATIVAN) tablet 1 mg  1 mg Oral Q6H PRN Nicholes Rough, NP       LORazepam (ATIVAN) tablet 1 mg  1 mg Oral TID Nicholes Rough, NP   1 mg at 12/14/22 1157   Followed by   Derrill Memo ON 12/15/2022] LORazepam (ATIVAN) tablet 1 mg  1 mg Oral BID Nicholes Rough, NP       Followed by   Derrill Memo ON 12/16/2022] LORazepam (ATIVAN) tablet 1 mg  1 mg Oral Daily Kekai Geter, NP       magnesium hydroxide (MILK OF MAGNESIA) suspension 30 mL  30 mL Oral Daily PRN Delfin Gant, NP       multivitamin with minerals tablet 1 tablet  1  tablet Oral Daily Nicholes Rough, NP   1 tablet at 12/14/22 [id number]   naproxen (NAPROSYN) tablet 500 mg  500 mg Oral BID WC Khameron Gruenwald, NP       nicotine (NICODERM CQ - dosed in mg/24 hours) patch 14 mg  14 mg Transdermal Daily Attiah, Nadir, MD   14 mg at 12/14/22 1246   OLANZapine zydis (ZYPREXA) disintegrating tablet 5 mg  5 mg Oral Q8H PRN Nicholes Rough, NP   5 mg at 12/14/22 1459   And   ziprasidone (GEODON) injection 20 mg  20 mg Intramuscular PRN Nicholes Rough, NP       ondansetron (ZOFRAN-ODT) disintegrating tablet 4 mg  4 mg Oral Q6H PRN Nicholes Rough, NP   4 mg at 12/13/22 1130   QUEtiapine (SEROQUEL XR) 24 hr tablet 100 mg  100 mg Oral QHS Treshun Wold, NP       QUEtiapine (SEROQUEL) tablet 25 mg  25 mg Oral BID Joanmarie Tsang, NP       thiamine (Vitamin B-1) tablet 100 mg  100 mg Oral Daily Camilo Mander, NP   100 mg at 12/14/22 [id number]   thiamine (VITAMIN B1) injection 100 mg  100 mg Intramuscular Once Nicholes Rough, NP        Lab Results:  Results for orders placed or performed during the hospital encounter of 12/12/22 (from the past 48 hour(s))  TSH     Status: None   Collection Time: 12/14/22  6:30 AM  Result Value Ref Range   TSH 1.322 0.350 - 4.500 uIU/mL    Comment: Performed by a 3rd Generation assay with a functional sensitivity of <=0.01 uIU/mL. Performed at [facility], [place] [address], [place], [place] [address]   Hemoglobin A1c     Status: Abnormal   Collection Time: 12/14/22  6:30 AM  Result Value Ref Range   Hgb A1c MFr Bld 6.1 (H) 4.8 - 5.6 %    Comment: (NOTE) Pre diabetes:  5.7%-6.4%  Diabetes:              >6.4%  Glycemic control for   <7.0% adults with diabetes    Mean Plasma Glucose 128.37 mg/dL    Comment: Performed at [facility] [address] [place], [place] [address]  Lipid panel     Status: Abnormal   Collection Time: 12/14/22  6:30 AM  Result Value Ref Range   Cholesterol 292 (H) 0 - 200 mg/dL    Triglycerides 98 <150 mg/dL   HDL 77 >40 mg/dL   Total CHOL/HDL Ratio 3.8 RATIO   VLDL 20 0 - 40 mg/dL   LDL Cholesterol 195 (H) 0 - 99 mg/dL    Comment:        Total Cholesterol/HDL:CHD Risk Coronary Heart Disease Risk Table                     Men   Women  1/2 Average Risk   3.4   3.3  Average Risk       5.0   4.4  2 X Average Risk   9.6   7.1  3 X Average Risk  23.4   11.0        Use the calculated Patient Ratio above and the CHD Risk Table to determine the patient's CHD Risk.        ATP III CLASSIFICATION (LDL):  <100     mg/dL   Optimal  100-129  mg/dL   Near or Above                    Optimal  130-159  mg/dL   Borderline  160-189  mg/dL   High  >190     mg/dL   Very High Performed at [facility] [address] [place], [place] [address]   Vitamin B12     Status: None   Collection Time: 12/14/22  6:30 AM  Result Value Ref Range   Vitamin B-12 252 180 - 914 pg/mL    Comment: (NOTE) This assay is not validated for testing neonatal or myeloproliferative syndrome specimens for Vitamin B12 levels. Performed at [facility], [place] [address] [place], [place] [address]   VITAMIN D 25 Hydroxy (Vit-D Deficiency, Fractures)     Status: Abnormal   Collection Time: 12/14/22  6:30 AM  Result Value Ref Range   Vit D, 25-Hydroxy 12.49 (L) 30 - 100 ng/mL    Comment: (NOTE) Vitamin D deficiency has been defined by the [place] practice guideline as a level of serum 25-OH  vitamin D less than 20 ng/mL (1,2). The Endocrine Society went on to  further define vitamin D insufficiency as a level between 21 and 29  ng/mL (2).  1. IOM (Institute of Medicine). 2010. Dietary reference intakes for  calcium and D. [place]: The [employer]. 2. Holick MF, Binkley [place], Bischoff-Ferrari HA, et al. Evaluation,  treatment, and prevention of vitamin D deficiency: an Endocrine  Society clinical  practice guideline, JCEM. 2011 Jul; 96(7): 1911-30.  Performed at [facility], [place] [address] [place], [place] [address]   Hepatic function panel     Status: Abnormal   Collection Time: 12/14/22  6:30 AM  Result Value Ref Range   Total Protein 7.7 6.5 - 8.1 g/dL   Albumin 4.3 3.5 - 5.0 g/dL   AST 83 (H) 15 - 41 U/L  ALT 91 (H) 0 - 44 U/L   Alkaline Phosphatase 90 38 - 126 U/L   Total Bilirubin 1.1 0.3 - 1.2 mg/dL   Bilirubin, Direct 0.2 0.0 - 0.2 mg/dL   Indirect Bilirubin 0.9 0.3 - 0.9 mg/dL    Comment: Performed at [facility], [place] [address] [place], [place] [address]    Blood Alcohol level:  Lab Results  Component Value Date   ETH 275 (H) 123456    Metabolic Disorder Labs: Lab Results  Component Value Date   HGBA1C 6.1 (H) 12/14/2022   MPG 128.37 12/14/2022   MPG 128.37 09/27/2020   No results found for: "PROLACTIN" Lab Results  Component Value Date   CHOL 292 (H) 12/14/2022   TRIG 98 12/14/2022   HDL 77 12/14/2022   CHOLHDL 3.8 12/14/2022   VLDL 20 12/14/2022   LDLCALC 195 (H) 12/14/2022   LDLCALC 222 (H) 10/30/2020    Physical Findings: AIMS:  , ,  ,  ,    CIWA:  CIWA-Ar Total: 2 COWS:     Musculoskeletal: Strength & Muscle Tone: within normal limits Gait & Station: normal Patient leans: N/A  Psychiatric Specialty Exam:  Presentation  General Appearance:  Disheveled  Eye Contact: Good  Speech: Clear and Coherent  Speech Volume: Normal  Handedness: Right   Mood and Affect  Mood: Depressed  Affect: Congruent   Thought Process  Thought Processes: Coherent  Descriptions of Associations:Intact  Orientation:Full (Time, Place and Person)  Thought Content:Logical  History of Schizophrenia/Schizoaffective disorder:No data recorded Duration of Psychotic Symptoms:No data recorded Hallucinations:Hallucinations: None  Ideas of Reference:None  Suicidal Thoughts:Suicidal Thoughts: No  Homicidal  Thoughts:Homicidal Thoughts: No   Sensorium  Memory: Immediate Good  Judgment: Poor  Insight: Poor   Executive Functions  Concentration: Poor  Attention Span: Poor  Recall: [employer] of Knowledge: Fair  Language: Fair   Psychomotor Activity  Psychomotor Activity: Psychomotor Activity: Normal   Assets  Assets: Communication Skills   Sleep  Sleep: Sleep: Good    Physical Exam: Physical Exam HENT:     Mouth/Throat:     Mouth: Mucous membranes are moist.  Pulmonary:     Effort: Pulmonary effort is normal.  Musculoskeletal:     Left lower leg: No edema.  Neurological:     Mental Status: He is alert and oriented to person, place, and time.  Psychiatric:        Thought Content: Thought content normal.    Review of Systems  Constitutional: Negative.  Negative for fever.  HENT: Negative.    Eyes: Negative.  Negative for blurred vision.  Respiratory: Negative.    Cardiovascular: Negative.   Gastrointestinal: Negative.  Negative for heartburn.  Genitourinary: Negative.   Musculoskeletal: Negative.   Skin: Negative.   Neurological: Negative.   Psychiatric/Behavioral:  Positive for depression and substance abuse. Negative for hallucinations, memory loss and suicidal ideas. The patient is nervous/anxious and has insomnia.    Blood pressure 122/89, pulse 63, temperature 98 F (36.7 C), temperature source Oral, resp. rate 18, height 5' 9"$  (1.753 m), weight 80 kg, SpO2 94 %. Body mass index is 26.05 kg/m.   Treatment Plan Summary: Treatment Plan Summary: Daily contact with patient to assess and evaluate symptoms and progress in treatment and Medication management   Observation Level/Precautions:  15 minute checks  Laboratory:  Labs reviewed   Psychotherapy:  Unit Group sessions  Medications:  See [facility]  Consultations:  To be determined   Discharge Concerns:  Safety, medication compliance, mood stability  Estimated LOS: 5-7 days  Other:  N/A     Labs reviewed on 12/13/2022: Positive for benzos, positive for amphetamines, UA WNL, with exception of protein of 30.  CMP with elevated LFTs, CBC reviewed, EtOH level 275.  Labs ordered: TSH, hemoglobin A1c, lipid panel, vitamins B1, B12, vitamin D.  Repeat LFTs.  EKG with QTc of 458.   PLAN Safety and Monitoring: Voluntary admission to inpatient psychiatric unit for safety, stabilization and treatment Daily contact with patient to assess and evaluate symptoms and progress in treatment Patient's case to be discussed in multi-disciplinary team meeting Observation Level : q15 minute checks Vital signs: q12 hours Precautions: Safety   Long Term Goal(s): Improvement in symptoms so as ready for discharge   Short Term Goals: Ability to identify changes in lifestyle to reduce recurrence of condition will improve, Ability to disclose and discuss suicidal ideas, Ability to demonstrate self-control will improve, Ability to identify and develop effective coping behaviors will improve, Ability to maintain clinical measurements within normal limits will improve, Compliance with prescribed medications will improve, and Ability to identify triggers associated with substance abuse/mental health issues will improve   Diagnoses Principal Problem:   MDD (major depressive disorder), recurrent severe, without psychosis ([place]) Active Problems:   Alcohol use disorder   GERD (gastroesophageal reflux disease)   Attention deficit hyperactivity disorder (ADHD)   GAD (generalized anxiety disorder)   Medications: -Change night time Seroquel to 100 mg XR nightly for mood stabilization -Start Seroquel 25 mg BID (0800 & 1400 for mood stabilization) -Continue Ativan detox protocol for EtOH abuse -Continue to monitor withdrawals via CIWA scores -Continue hydroxyzine 25 mg 3 times daily as needed for anxiety -Continue clonidine 0.1 mg twice daily as needed for SBP over 160 or DBP over 100 -Discontinue Cymbalta 30 mg  daily for depressive symptoms (home medication)-states has not taken for over 2 months Patient educated on rationales, benefits, possible side effects of all medications as listed above and verbalizes understanding of all.   Other PRNS -Start Naproxen 500 mg BID for pain x 6 doses, then change to PRN -Continue Maalox 30 mg every 4 hrs PRN for indigestion -Continue Imodium 2-4 mg as needed for diarrhea -Continue Milk of Magnesia as needed every 6 hrs for constipation -Continue Zofran disintegrating tabs every 6 hrs PRN for nausea    Discharge Planning: Social work and case management to assist with discharge planning and identification of hospital follow-up needs prior to discharge Estimated LOS: 5-7 days Discharge Concerns: Need to establish a safety plan; Medication compliance and effectiveness Discharge Goals: Return home with outpatient referrals for mental health follow-up including medication management/psychotherapy   I certify that inpatient services furnished can reasonably be expected to improve the patient's condition.      Nicholes Rough, NP 12/14/2022, 4:42 PM

## 2022-12-14 NOTE — Progress Notes (Signed)
   12/13/22 2130  Psych Admission Type (Psych Patients Only)  Admission Status Voluntary  Psychosocial Assessment  Patient Complaints Anxiety;Depression  Eye Contact Fair  Facial Expression Flat  Affect Sad;Anxious  Speech Logical/coherent  Interaction Cautious  Motor Activity Slow  Appearance/Hygiene In scrubs  Behavior Characteristics Appropriate to situation  Mood Depressed;Anxious  Thought Process  Coherency WDL  Content Blaming others  Delusions None reported or observed  Perception WDL  Hallucination None reported or observed  Judgment Poor  Confusion None  Danger to Self  Current suicidal ideation? Passive  Self-Injurious Behavior No self-injurious ideation or behavior indicators observed or expressed   Agreement Not to Harm Self Yes  Description of Agreement Verbal  Danger to Others  Danger to Others None reported or observed

## 2022-12-14 NOTE — BH IP Treatment Plan (Signed)
Interdisciplinary Treatment and Diagnostic Plan Update  12/14/2022 Time of Session: 9:30am  Aaron Burke MRN: [id number]  Principal Diagnosis: MDD (major depressive disorder), recurrent severe, without psychosis ([place])  Secondary Diagnoses: Principal Problem:   MDD (major depressive disorder), recurrent severe, without psychosis ([place]) Active Problems:   Alcohol use disorder   GERD (gastroesophageal reflux disease)   Attention deficit hyperactivity disorder (ADHD)   GAD (generalized anxiety disorder)   Current Medications:  Current Facility-Administered Medications  Medication Dose Route Frequency Provider Last Rate Last Admin   alum & mag hydroxide-simeth (MAALOX/MYLANTA) 200-200-20 MG/5ML suspension 30 mL  30 mL Oral Q4H PRN Charmaine Downs C, NP       cloNIDine (CATAPRES) tablet 0.1 mg  0.1 mg Oral BID PRN Nicholes Rough, NP   0.1 mg at 12/14/22 [id number]   DULoxetine (CYMBALTA) DR capsule 30 mg  30 mg Oral Daily Charmaine Downs C, NP   30 mg at 12/14/22 [id number]   hydrOXYzine (ATARAX) tablet 25 mg  25 mg Oral Q6H PRN Nicholes Rough, NP   25 mg at 12/14/22 [id number]   loperamide (IMODIUM) capsule 2-4 mg  2-4 mg Oral PRN Nicholes Rough, NP       LORazepam (ATIVAN) tablet 1 mg  1 mg Oral Q6H PRN Nicholes Rough, NP       LORazepam (ATIVAN) tablet 1 mg  1 mg Oral TID Nicholes Rough, NP   1 mg at 12/14/22 1157   Followed by   Derrill Memo ON 12/15/2022] LORazepam (ATIVAN) tablet 1 mg  1 mg Oral BID Nicholes Rough, NP       Followed by   Derrill Memo ON 12/16/2022] LORazepam (ATIVAN) tablet 1 mg  1 mg Oral Daily Nkwenti, Doris, NP       OLANZapine zydis (ZYPREXA) disintegrating tablet 5 mg  5 mg Oral Q8H PRN Nkwenti, Doris, NP       And   LORazepam (ATIVAN) tablet 1 mg  1 mg Oral PRN Nicholes Rough, NP       And   ziprasidone (GEODON) injection 20 mg  20 mg Intramuscular PRN Nkwenti, Doris, NP       magnesium hydroxide (MILK OF MAGNESIA) suspension 30 mL  30 mL Oral Daily PRN Charmaine Downs C, NP        multivitamin with minerals tablet 1 tablet  1 tablet Oral Daily Nicholes Rough, NP   1 tablet at 12/14/22 [id number]   nicotine (NICODERM CQ - dosed in mg/24 hours) patch 14 mg  14 mg Transdermal Daily Attiah, Nadir, MD   14 mg at 12/14/22 1246   ondansetron (ZOFRAN-ODT) disintegrating tablet 4 mg  4 mg Oral Q6H PRN Nicholes Rough, NP   4 mg at 12/13/22 1130   QUEtiapine (SEROQUEL) tablet 50 mg  50 mg Oral Daily Nicholes Rough, NP   50 mg at 12/13/22 2127   thiamine (Vitamin B-1) tablet 100 mg  100 mg Oral Daily Nicholes Rough, NP   100 mg at 12/14/22 [id number]   thiamine (VITAMIN B1) injection 100 mg  100 mg Intramuscular Once Nicholes Rough, NP       PTA Medications: Medications Prior to Admission  Medication Sig Dispense Refill Last Dose   amphetamine-dextroamphetamine (ADDERALL) 10 MG tablet Take 10 mg by mouth daily. Takes daily in the afternoon      amphetamine-dextroamphetamine (ADDERALL) 30 MG tablet Take 1 tablet by mouth daily.      DULoxetine (CYMBALTA) 30 MG capsule Take 30 mg by mouth daily. (Patient not taking: Reported on 12/12/2022)  LORazepam (ATIVAN) 1 MG tablet Take 1 mg by mouth at bedtime as needed for anxiety or sleep.      OTEZLA 30 MG TABS Take 1 tablet by mouth 2 (two) times daily.      pantoprazole (PROTONIX) 40 MG tablet Take 40 mg by mouth daily.      QUEtiapine (SEROQUEL) 25 MG tablet Take 25 mg by mouth daily.      risperiDONE (RISPERDAL) 1 MG tablet Take 2 mg by mouth at bedtime.       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   Physician Treatment Plan for Primary Diagnosis: MDD (major depressive disorder), recurrent severe, without psychosis  ([place]) Long Term Goal(s): Improvement in symptoms so as ready for discharge   Short Term Goals: Ability to identify changes in lifestyle to reduce recurrence of condition will improve Ability to disclose and discuss suicidal ideas Ability to demonstrate self-control will improve Ability to identify and develop effective coping behaviors will improve Ability to maintain clinical measurements within normal limits will improve Compliance with prescribed medications will improve Ability to identify triggers associated with substance abuse/mental health issues will improve  Medication Management: Evaluate patient's response, side effects, and tolerance of medication regimen.  Therapeutic Interventions: 1 to 1 sessions, Unit Group sessions and Medication administration.  Evaluation of Outcomes: Not Met  Physician Treatment Plan for Secondary Diagnosis: Principal Problem:   MDD (major depressive disorder), recurrent severe, without psychosis ([place]) Active Problems:   Alcohol use disorder   GERD (gastroesophageal reflux disease)   Attention deficit hyperactivity disorder (ADHD)   GAD (generalized anxiety disorder)  Long Term Goal(s): Improvement in symptoms so as ready for discharge   Short Term Goals: Ability to identify changes in lifestyle to reduce recurrence of condition will improve Ability to disclose and discuss suicidal ideas Ability to demonstrate self-control will improve Ability to identify and develop effective coping behaviors will improve Ability to maintain clinical measurements within normal limits will improve Compliance with prescribed medications will improve Ability to identify triggers associated with substance abuse/mental health issues will improve     Medication Management: Evaluate patient's response, side effects, and tolerance of medication regimen.  Therapeutic Interventions: 1 to 1 sessions, Unit Group sessions and Medication administration.  Evaluation of  Outcomes: Not Met   RN Treatment Plan for Primary Diagnosis: MDD (major depressive disorder), recurrent severe, without psychosis ([place]) Long Term Goal(s): Knowledge of disease and therapeutic regimen to maintain health will improve  Short Term Goals: Ability to remain free from injury will improve, Ability to participate in decision making will improve, Ability to verbalize feelings will improve, Ability to disclose and discuss suicidal ideas, and Ability to identify and develop effective coping behaviors will improve  Medication Management: RN will administer medications as ordered by provider, will assess and evaluate patient's response and provide education to patient for prescribed medication. RN will report any adverse and/or side effects to prescribing provider.  Therapeutic Interventions: 1 on 1 counseling sessions, Psychoeducation, Medication administration, Evaluate responses to treatment, Monitor vital signs and CBGs as ordered, Perform/monitor CIWA, COWS, AIMS and Fall Risk screenings as ordered, Perform wound care treatments as ordered.  Evaluation of Outcomes: Not Met   LCSW Treatment Plan for Primary  Diagnosis: MDD (major depressive disorder), recurrent severe, without psychosis ([place]) Long Term Goal(s): Safe transition to appropriate next level of care at discharge, Engage patient in therapeutic group addressing interpersonal concerns.  Short Term Goals: Engage patient in aftercare planning with referrals and resources, Increase social support, Increase emotional regulation, Facilitate acceptance of mental health diagnosis and concerns, Identify triggers associated with mental health/substance abuse issues, and Increase skills for wellness and recovery  Therapeutic Interventions: Assess for all discharge needs, 1 to 1 time with Social worker, Explore available resources and support systems, Assess for adequacy in community support network, Educate family and significant other(s) on  suicide prevention, Complete Psychosocial Assessment, Interpersonal group therapy.  Evaluation of Outcomes: Not Met   Progress in Treatment: Attending groups: Yes. Participating in groups: Yes. Taking medication as prescribed: Yes. Toleration medication: Yes. Family/Significant other contact made: Yes, individual(s) contacted:  Wife  Patient understands diagnosis: No. Discussing patient identified problems/goals with staff: Yes. Medical problems stabilized or resolved: Yes. Denies suicidal/homicidal ideation: Yes. Issues/concerns per patient self-inventory: No.   New problem(s) identified: No, Describe:  None   New Short Term/Long Term Goal(s): medication stabilization, elimination of SI thoughts, development of comprehensive mental wellness plan.   Patient Goals:  "To go home"   Discharge Plan or Barriers: Patient recently admitted. CSW will continue to follow and assess for appropriate referrals and possible discharge planning.   Reason for Continuation of Hospitalization: Anxiety Depression Medication stabilization Suicidal ideation  Estimated Length of Stay: 3 to 7 days   Last [place] Suicide Severity Risk Score: [place] Admission (Current) from 12/12/2022 in [facility]  C-SSRS RISK CATEGORY No Risk       Last PHQ 2/9 Scores:     No data to display          Scribe for Treatment Team: Darleen Crocker, Latanya Presser 12/14/2022 2:00 PM

## 2022-12-14 NOTE — Progress Notes (Signed)
   12/14/22 1300  Psych Admission Type (Psych Patients Only)  Admission Status Voluntary  Psychosocial Assessment  Patient Complaints Anxiety;Depression  Eye Contact Fair  Facial Expression Flat  Affect Anxious  Speech Logical/coherent  Interaction Cautious  Motor Activity Slow  Appearance/Hygiene Unremarkable  Behavior Characteristics Cooperative  Mood Depressed;Anxious  Thought Process  Coherency WDL  Content WDL  Delusions None reported or observed  Perception WDL  Hallucination None reported or observed  Judgment Poor  Confusion None  Danger to Self  Current suicidal ideation? Denies  Self-Injurious Behavior No self-injurious ideation or behavior indicators observed or expressed   Agreement Not to Harm Self Yes  Description of Agreement verbal  Danger to Others  Danger to Others None reported or observed

## 2022-12-14 NOTE — Progress Notes (Signed)
   12/14/22 0601  15 Minute Checks  Location Bedroom  Visual Appearance Calm  Behavior Sleeping  Sleep (Behavioral Health Patients Only)  Calculate sleep? (Click Yes once per 24 hr at 0600 safety check) Yes  Documented sleep last 24 hours 11.75

## 2022-12-15 MED ORDER — HYDROXYZINE HCL 50 MG PO TABS
50.0000 mg | ORAL_TABLET | Freq: Four times a day (QID) | ORAL | Status: DC | PRN
  Administered 2022-12-15 – 2022-12-20 (×12): 50 mg via ORAL
  Filled 2022-12-15 (×12): qty 1

## 2022-12-15 NOTE — Group Note (Signed)
LCSW Group Therapy Note   Group Date: 12/15/2022 Start Time: 1100 End Time: 1200   Type of Therapy and Topic:  Group Therapy:  Strengths Exploration   Participation Level: Active  Description of Group: This group allows individuals to explore their strengths, learn to use strengths in new ways to improve well-being. Strengths-based interventions involve identifying strengths, understanding how they are used, and learning new ways to apply them. Individuals will identify their strengths, and then explore their roles in different areas of life (relationships, professional life, and personal fulfillment). Individuals will think about ways in which they currently use their strengths, along with new ways they could begin using them.    Therapeutic Goals Patient will verbalize two of their strengths Patient will identify how their strengths are currently used Patient will identify two new ways to apply their strengths  Patients will create a plan to apply their strengths in their daily lives     Summary of Patient Progress:  The Pt attended group and remained there the entire time.  The Pt accepted all worksheets and materials.  The Pt demonstrated understanding of the topic being discussed by asking questions and participating in open discussion with their peers.  The Pt was appropriate with peers and staff.    Therapeutic Modalities Cognitive Behavioral Therapy Motivational Interviewing  Darleen Crocker, [place] 12/15/2022  1:04 PM

## 2022-12-15 NOTE — Progress Notes (Signed)
   12/15/22 0630  15 Minute Checks  Location Bedroom  Visual Appearance Calm  Behavior Composed  Sleep (Behavioral Health Patients Only)  Calculate sleep? (Click Yes once per 24 hr at 0600 safety check) Yes  Documented sleep last 24 hours 10.25

## 2022-12-15 NOTE — Progress Notes (Signed)
Patient presents with flat affect and irritable. Patient states "I shouldn't be here." "They need to discharge me." Discharge process explained to patient. Patient observed attending one group today at least thus far. Patient upset due to "lock out" order. Patient also states he remains anxious and received hydroxyzine at 1102am which he claims did not help much despite increase. Patient compliant with scheduled medication. Patient denies SI,HI, and A/V/H with no plan or intent.

## 2022-12-15 NOTE — Group Note (Signed)
Date:  12/15/2022 Time:  10:22 AM  Group Topic/Focus:  Goals Group:   The focus of this group is to help patients establish daily goals to achieve during treatment and discuss how the patient can incorporate goal setting into their daily lives to aide in recovery. Orientation:   The focus of this group is to educate the patient on the purpose and policies of crisis stabilization and provide a format to answer questions about their admission.  The group details unit policies and expectations of patients while admitted.    Participation Level:  Did Not Attend  Participation Quality:    Affect:    Cognitive:    Insight:   Engagement in Group:    Modes of Intervention:    Additional Comments:  pt was rude when asked to come to group and did not particpate after encouraged.  Garvin Fila 12/15/2022, 10:22 AM

## 2022-12-15 NOTE — Progress Notes (Signed)
The patient came out of his bedroom requesting "anxiety" medication approximately 30 minutes ago. The patient is now upset because he can not receive any medication for sleep.

## 2022-12-15 NOTE — Progress Notes (Signed)
[facility] MD Progress Note  12/15/2022 4:04 PM Skylur Bedner  MRN:  [id number] Principal Problem: MDD (major depressive disorder), recurrent severe, without psychosis ([place]) Diagnosis: Principal Problem:   MDD (major depressive disorder), recurrent severe, without psychosis ([place]) Active Problems:   Alcohol use disorder   GERD (gastroesophageal reflux disease)   Attention deficit hyperactivity disorder (ADHD)   GAD (generalized anxiety disorder)  Reason For Admission: Adeyemi Goad is a 37 yo Caucasian male with a reported past mental health history of borderline personality disorder, GAD & ADHD who was taken to the [facility] ED on 02/18 by the [facility] police dept and EMS after he made suicidal statements to his wife and child, and overdose on pills in the context of relationship stressors.  Patient was subsequently involuntarily committed and transferred to this behavioral health hospital for treatment and stabilization of his mental status.   24 hr chart review: Vital signs with intermittent periods of elevations since hospitalization, with DBP running in the 90s to 100s.  Patient is compliant with scheduled medications.  He required clonidine 0.1 mg earlier today morning, required hydroxyzine 50 mg earlier today morning at 11 AM.  Sleep hours are documented as 10.25 hours last night.  Mood has remained angry and irritable over the past 24 hours.  Patient assessment note: Pt continues to present with a flat affect and depressed, angry and irritable mood. Attention to personal hygiene and grooming continues to be poor, he appears disheveled, seems as though he has not taken care of personal hygiene in at least a month, clothing is layered. Positive reinforcements given for pt to take care of personal hygiene and grooming. Eye contact is good, speech is clear & coherent. Thought contents are organized and logical, and pt currently denies SI/HI/AVH or paranoia. There is no evidence of delusional  thoughts.    Writer inquired with patient today how many guns he has at home, patient reports that he has 3: "2 shotguns and one pistol".  Patient reports that his wife has the guns, and that he does not have access.  Yesterday, wife stated that there are 4 guns in the home (see yesterday's notes). Patient continues to display anger at his wife's intention to end her relationship.  Writer attempted to talk about this to patient, but patient none receptive and stated "I do not want to talk about that".  He states that he would like to be discharged so that he can go "get out of the house".  Patient's wife had indicated yesterday that she would change all locks of the house and patient would not be granted access to the house.  Yesterday, patient's wife stated that she notified patient of this during their phone conversation.  Due to patient's intense anger towards his wife, Probation officer called wife again today at [phone] Gasper Sells), after speaking with attending psychiatrist who requested that phone call be placed.  Patient's wife notified that if her intent is to change all the locks of her home and not grant pt access into the home, it would be advisable for her to seek a restraining order on patient since pt is very angry at her decision to lock him out of the home. Pt's wife verbalized understanding, thanked Probation officer, and call ended. Wife was also notified that pt has not voiced any homicidal threats towards her, but is very angry at her.  Patient reports that his sleep quality last night was "broken", he states that he woke up at 2 AM and did not  fall back asleep until 4 AM.  He declines offers of additional sleep medication.  He reports a poor appetite, stated that he wanted barbecue chips which were provided to him by Probation officer.  Fluids were encouraged, and patient was educated on alternative food choices offered at the cafeteria if he does not like what is being offered.  He reports that he is tolerating  medications well with no medication related side effects currently.  We are continuing medications as listed below with no changes today.  Patient's wife asked that treatment team notifies her 1 day prior to patient being discharged from the hospital.  Total Time spent with patient: 45 minutes  Past Psychiatric History: Borderline personality d/o, ADHD  Past Medical History:  Past Medical History:  Diagnosis Date   Gallstone pancreatitis 2020   GERD (gastroesophageal reflux disease)    Plaque psoriasis    Psoriatic arthritis ([place])    Testicular torsion 2020    Past Surgical History:  Procedure Laterality Date   CHOLECYSTECTOMY     ESOPHAGOGASTRODUODENOSCOPY  2020   with dilatation   KNEE RECONSTRUCTION Left 08/30/2004   LEFT HEART CATH AND CORONARY ANGIOGRAPHY N/A 09/29/2020   Procedure: LEFT HEART CATH AND CORONARY ANGIOGRAPHY;  Surgeon: Martinique, Peter M, MD;  Location: [facility];  Service: Cardiovascular;  Laterality: N/A;   Family History:  Family History  Problem Relation Age of Onset   Hyperlipidemia Mother    Heart attack Father        x 2, total 4 stents   Heart attack Paternal Grandfather    Bipolar disorder Other    Family Psychiatric  History: See H & P Social History:  Social History   Substance and Sexual Activity  Alcohol Use Never     Social History   Substance and Sexual Activity  Drug Use Never    Social History   Socioeconomic History   Marital status: Married    Spouse name: Not on file   Number of children: Not on file   Years of education: Not on file   Highest education level: Not on file  Occupational History   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   Social Determinants of Health   Financial Resource Strain: Not on file  Food Insecurity: No Food Insecurity (12/13/2022)   Hunger Vital Sign    Worried About Running Out of Food in the Last Year: Never true    Ran Out of Food in the Last Year: Never true  Transportation Needs: Unknown (12/13/2022)   PRAPARE - Hydrologist (Medical): Patient refused    Lack of Transportation (Non-Medical): Patient refused  Physical Activity: Not on file  Stress: Not on file  Social Connections: Not on file   Sleep: Good  Appetite:  Poor  Current Medications: Current Facility-Administered Medications  Medication Dose Route Frequency Provider Last Rate Last Admin   alum & mag hydroxide-simeth (MAALOX/MYLANTA) 200-200-20 MG/5ML suspension 30 mL  30 mL Oral Q4H PRN Delfin Gant, NP  cloNIDine (CATAPRES) tablet 0.1 mg  0.1 mg Oral BID PRN Nicholes Rough, NP   0.1 mg at 12/15/22 [id number]   hydrOXYzine (ATARAX) tablet 50 mg  50 mg Oral Q6H PRN Nicholes Rough, NP   50 mg at 12/15/22 1102   loperamide (IMODIUM) capsule 2-4 mg  2-4 mg Oral PRN Nicholes Rough, NP       LORazepam (ATIVAN) tablet 1 mg  1 mg Oral Q6H PRN Nicholes Rough, NP       LORazepam (ATIVAN) tablet 1 mg  1 mg Oral BID Nicholes Rough, NP   1 mg at 12/15/22 [id number]   Followed by   Derrill Memo ON 12/16/2022] LORazepam (ATIVAN) tablet 1 mg  1 mg Oral Daily Kama Cammarano, NP       magnesium hydroxide (MILK OF MAGNESIA) suspension 30 mL  30 mL Oral Daily PRN Delfin Gant, NP       multivitamin with minerals tablet 1 tablet  1 tablet Oral Daily Nicholes Rough, NP   1 tablet at 12/15/22 0805   nicotine (NICODERM CQ - dosed in mg/24 hours) patch 14 mg  14 mg Transdermal Daily Winfred Leeds, Nadir, MD   14 mg at 12/15/22 0805   OLANZapine zydis (ZYPREXA) disintegrating tablet 5 mg  5 mg Oral Q8H PRN Nicholes Rough, NP   5 mg at  12/14/22 1459   And   ziprasidone (GEODON) injection 20 mg  20 mg Intramuscular PRN Nicholes Rough, NP       ondansetron (ZOFRAN-ODT) disintegrating tablet 4 mg  4 mg Oral Q6H PRN Nicholes Rough, NP   4 mg at 12/13/22 1130   QUEtiapine (SEROQUEL XR) 24 hr tablet 100 mg  100 mg Oral QHS Jimena Wieczorek, NP       QUEtiapine (SEROQUEL) tablet 25 mg  25 mg Oral BID Nicholes Rough, NP   25 mg at 12/15/22 1317   thiamine (Vitamin B-1) tablet 100 mg  100 mg Oral Daily Nicholes Rough, NP   100 mg at 12/15/22 [id number]   thiamine (VITAMIN B1) injection 100 mg  100 mg Intramuscular Once Nicholes Rough, NP        Lab Results:  Results for orders placed or performed during the hospital encounter of 12/12/22 (from the past 48 hour(s))  TSH     Status: None   Collection Time: 12/14/22  6:30 AM  Result Value Ref Range   TSH 1.322 0.350 - 4.500 uIU/mL    Comment: Performed by a 3rd Generation assay with a functional sensitivity of <=0.01 uIU/mL. Performed at [facility], [place] [address] [place], [place] [address]   Hemoglobin A1c     Status: Abnormal   Collection Time: 12/14/22  6:30 AM  Result Value Ref Range   Hgb A1c MFr Bld 6.1 (H) 4.8 - 5.6 %    Comment: (NOTE) Pre diabetes:          5.7%-6.4%  Diabetes:              >6.4%  Glycemic control for   <7.0% adults with diabetes    Mean Plasma Glucose 128.37 mg/dL    Comment: Performed at [facility] [address], [place], [place] [address]  Lipid panel     Status: Abnormal   Collection Time: 12/14/22  6:30 AM  Result Value Ref Range   Cholesterol 292 (H) 0 - 200 mg/dL   Triglycerides 98 <150 mg/dL   HDL 77 >40 mg/dL   Total CHOL/HDL Ratio 3.8 RATIO  VLDL 20 0 - 40 mg/dL   LDL Cholesterol 195 (H) 0 - 99 mg/dL    Comment:        Total Cholesterol/HDL:CHD Risk Coronary Heart Disease Risk Table                     Men   Women  1/2 Average Risk   3.4   3.3  Average Risk       5.0   4.4  2 X Average Risk   9.6    7.1  3 X Average Risk  23.4   11.0        Use the calculated Patient Ratio above and the CHD Risk Table to determine the patient's CHD Risk.        ATP III CLASSIFICATION (LDL):  <100     mg/dL   Optimal  100-129  mg/dL   Near or Above                    Optimal  130-159  mg/dL   Borderline  160-189  mg/dL   High  >190     mg/dL   Very High Performed at [facility] [address], [place], [place] [address]   Vitamin B12     Status: None   Collection Time: 12/14/22  6:30 AM  Result Value Ref Range   Vitamin B-12 252 180 - 914 pg/mL    Comment: (NOTE) This assay is not validated for testing neonatal or myeloproliferative syndrome specimens for Vitamin B12 levels. Performed at [facility], [place] [address], [place], [place] [address]   VITAMIN D 25 Hydroxy (Vit-D Deficiency, Fractures)     Status: Abnormal   Collection Time: 12/14/22  6:30 AM  Result Value Ref Range   Vit D, 25-Hydroxy 12.49 (L) 30 - 100 ng/mL    Comment: (NOTE) Vitamin D deficiency has been defined by the [place] practice guideline as a level of serum 25-OH  vitamin D less than 20 ng/mL (1,2). The Endocrine Society went on to  further define vitamin D insufficiency as a level between 21 and 29  ng/mL (2).  1. IOM (Institute of Medicine). 2010. Dietary reference intakes for  calcium and D. [place]: The [employer]. 2. Holick MF, [facility], Bischoff-Ferrari HA, et al. Evaluation,  treatment, and prevention of vitamin D deficiency: an Endocrine  Society clinical practice guideline, JCEM. 2011 Jul; 96(7): 1911-30.  Performed at [facility], [place] [address] [place], [place] [address]   Hepatic function panel     Status: Abnormal   Collection Time: 12/14/22  6:30 AM  Result Value Ref Range   Total Protein 7.7 6.5 - 8.1 g/dL   Albumin 4.3 3.5 - 5.0 g/dL   AST 83 (H) 15 - 41 U/L   ALT 91 (H) 0 - 44 U/L    Alkaline Phosphatase 90 38 - 126 U/L   Total Bilirubin 1.1 0.3 - 1.2 mg/dL   Bilirubin, Direct 0.2 0.0 - 0.2 mg/dL   Indirect Bilirubin 0.9 0.3 - 0.9 mg/dL    Comment: Performed at [facility], [place] [address], [place], [place] [address]    Blood Alcohol level:  Lab Results  Component Value Date   ETH 275 (H) 123456    Metabolic Disorder Labs: Lab Results  Component Value Date   HGBA1C 6.1 (H) 12/14/2022   MPG 128.37 12/14/2022  MPG 128.37 09/27/2020   No results found for: "PROLACTIN" Lab Results  Component Value Date   CHOL 292 (H) 12/14/2022   TRIG 98 12/14/2022   HDL 77 12/14/2022   CHOLHDL 3.8 12/14/2022   VLDL 20 12/14/2022   LDLCALC 195 (H) 12/14/2022   LDLCALC 222 (H) 10/30/2020    Physical Findings: AIMS:  , ,  ,  ,    CIWA:  CIWA-Ar Total: 4 COWS:     Musculoskeletal: Strength & Muscle Tone: within normal limits Gait & Station: normal Patient leans: N/A  Psychiatric Specialty Exam:  Presentation  General Appearance:  Disheveled  Eye Contact: Fair  Speech: Clear and Coherent  Speech Volume: Normal  Handedness: Right   Mood and Affect  Mood: Anxious; Angry; Depressed  Affect: Congruent   Thought Process  Thought Processes: Coherent  Descriptions of Associations:Intact  Orientation:Full (Time, Place and Person)  Thought Content:Logical  History of Schizophrenia/Schizoaffective disorder:No data recorded Duration of Psychotic Symptoms:No data recorded Hallucinations:Hallucinations: None  Ideas of Reference:None  Suicidal Thoughts:Suicidal Thoughts: No  Homicidal Thoughts:Homicidal Thoughts: No   Sensorium  Memory: Immediate Good  Judgment: Poor  Insight: Poor   Executive Functions  Concentration: Fair  Attention Span: Fair  Recall: [place] of Knowledge: Fair  Language: Fair   Psychomotor Activity  Psychomotor Activity: Psychomotor Activity: Normal   Assets   Assets: Communication Skills   Sleep  Sleep: Sleep: Fair    Physical Exam: Physical Exam HENT:     Mouth/Throat:     Mouth: Mucous membranes are moist.  Pulmonary:     Effort: Pulmonary effort is normal.  Musculoskeletal:     Left lower leg: No edema.  Neurological:     Mental Status: He is alert and oriented to person, place, and time.  Psychiatric:        Thought Content: Thought content normal.    Review of Systems  Constitutional: Negative.  Negative for fever.  HENT: Negative.    Eyes: Negative.  Negative for blurred vision.  Respiratory: Negative.    Cardiovascular: Negative.   Gastrointestinal: Negative.  Negative for heartburn.  Genitourinary: Negative.   Musculoskeletal: Negative.   Skin: Negative.   Neurological: Negative.   Psychiatric/Behavioral:  Positive for depression and substance abuse. Negative for hallucinations, memory loss and suicidal ideas. The patient is nervous/anxious and has insomnia.    Blood pressure (!) 137/102, pulse 77, temperature (!) 97.5 F (36.4 C), temperature source Oral, resp. rate 18, height 5' 9"$  (1.753 m), weight 80 kg, SpO2 94 %. Body mass index is 26.05 kg/m.   Treatment Plan Summary: Treatment Plan Summary: Daily contact with patient to assess and evaluate symptoms and progress in treatment and Medication management   Observation Level/Precautions:  15 minute checks  Laboratory:  Labs reviewed   Psychotherapy:  Unit Group sessions  Medications:  See [facility]  Consultations:  To be determined   Discharge Concerns:  Safety, medication compliance, mood stability  Estimated LOS: 5-7 days  Other:  N/A    Labs reviewed on 12/13/2022: Positive for benzos, positive for amphetamines, UA WNL, with exception of protein of 30.  CMP with elevated LFTs, CBC reviewed, EtOH level 275.  Labs ordered: TSH, hemoglobin A1c, lipid panel, vitamins B1, B12, vitamin D.  Repeat LFTs.  EKG with QTc of 458.   PLAN Safety and  Monitoring: Voluntary admission to inpatient psychiatric unit for safety, stabilization and treatment Daily contact with patient to assess and evaluate symptoms and progress in treatment Patient's  case to be discussed in multi-disciplinary team meeting Observation Level : q15 minute checks Vital signs: q12 hours Precautions: Safety   Long Term Goal(s): Improvement in symptoms so as ready for discharge   Short Term Goals: Ability to identify changes in lifestyle to reduce recurrence of condition will improve, Ability to disclose and discuss suicidal ideas, Ability to demonstrate self-control will improve, Ability to identify and develop effective coping behaviors will improve, Ability to maintain clinical measurements within normal limits will improve, Compliance with prescribed medications will improve, and Ability to identify triggers associated with substance abuse/mental health issues will improve   Diagnoses Principal Problem:   MDD (major depressive disorder), recurrent severe, without psychosis ([place]) Active Problems:   Alcohol use disorder   GERD (gastroesophageal reflux disease)   Attention deficit hyperactivity disorder (ADHD)   GAD (generalized anxiety disorder)   Medications: -Change night time Seroquel to 100 mg XR nightly for mood stabilization -Continue Seroquel 25 mg BID (0800 & 1400 for mood stabilization) -Continue Ativan detox protocol for EtOH abuse -Continue to monitor withdrawals via CIWA scores -Continue hydroxyzine 25 mg 3 times daily as needed for anxiety -Continue clonidine 0.1 mg twice daily as needed for SBP over 160 or DBP over 100 -Discontinue Cymbalta 30 mg daily for depressive symptoms (home medication)-states has not taken for over 2 months Patient educated on rationales, benefits, possible side effects of all medications as listed above and verbalizes understanding of all.   Other PRNS -Discontinue Naproxen 500 mg BID for pain x 6 doses, then change to  PRN-States does not want it -Continue Maalox 30 mg every 4 hrs PRN for indigestion -Continue Imodium 2-4 mg as needed for diarrhea -Continue Milk of Magnesia as needed every 6 hrs for constipation -Continue Zofran disintegrating tabs every 6 hrs PRN for nausea    Discharge Planning: Social work and case management to assist with discharge planning and identification of hospital follow-up needs prior to discharge Estimated LOS: 5-7 days Discharge Concerns: Need to establish a safety plan; Medication compliance and effectiveness Discharge Goals: Return home with outpatient referrals for mental health follow-up including medication management/psychotherapy   I certify that inpatient services furnished can reasonably be expected to improve the patient's condition.      Nicholes Rough, NP 12/15/2022, 4:04 PMPatient ID: Marcelino Freestone, male   DOB: 21-Sep-1986, 37 y.o.   MRN: [id number]

## 2022-12-15 NOTE — Progress Notes (Signed)
Adult Psychoeducational Group Note  Date:  12/15/2022 Time:  7:55 PM  Group Topic/Focus: Trivia  Participation Level:  Did Not Attend  Participation Quality:    Affect:    Cognitive:   Insight:   Engagement in Group:    Modes of Intervention:    Additional Comments:    Beryle Beams 12/15/2022, 7:55 PM

## 2022-12-15 NOTE — Plan of Care (Signed)
  Problem: Medication: Goal: Compliance with prescribed medication regimen will improve Outcome: Progressing   Problem: Safety: Goal: Periods of time without injury will increase Outcome: Progressing

## 2022-12-15 NOTE — Progress Notes (Signed)
   12/15/22 2200  Psych Admission Type (Psych Patients Only)  Admission Status Voluntary  Psychosocial Assessment  Patient Complaints Agitation;Anxiety;Depression;Irritability  Eye Contact Fair  Facial Expression Flat  Affect Anxious;Irritable  Speech Logical/coherent  Interaction Sarcastic;Needy  Motor Activity Fidgety  Appearance/Hygiene Unremarkable  Behavior Characteristics Agitated;Anxious;Fidgety;Pacing  Mood Anxious;Irritable  Thought Process  Coherency WDL  Content WDL  Delusions None reported or observed  Perception WDL  Hallucination None reported or observed  Judgment Limited  Confusion None  Danger to Self  Current suicidal ideation? Denies  Self-Injurious Behavior No self-injurious ideation or behavior indicators observed or expressed   Agreement Not to Harm Self Yes  Description of Agreement Verbal  Danger to Others  Danger to Others None reported or observed  Danger to Others Abnormal  Harmful Behavior to others No threats or harm toward other people  Destructive Behavior No threats or harm toward property   D- Patient alert and oriented. Patient affect/mood.reported as "I have been highly anxious all day and nothing has seem to help."  Patient is visibly upset after he was told how he ended up in this facility. Patient agitated, but assures that "it isn't towards anybody but my damn wife. All this is hearsay." Patient seen pacing and crying. Denies SI, HI, AVH, and pain.  A- Scheduled medications and PRNs administered to patient, per MD orders. Support and encouragement provided.  Routine safety checks conducted every 15 minutes.  Patient informed to notify staff with problems or concerns. R- No adverse drug reactions noted. Patient contracts for safety at this time. Patient compliant with medications and treatment plan. Patient receptive, calm, and cooperative. Patient interacts well with others on the unit.  Patient remains safe at this time.

## 2022-12-15 NOTE — Progress Notes (Signed)
Patient noted to be pacing back and forth from his room to the dayroom and up and down the hallway. Verbalized, "my wife is a cunt and a bitch and now I am going to be homeless for the second time in my life." Patient breathing begins to become shallow and patient starts to cry. Patient states, "I can't even sleep because I am thinking about that bitch and I didn't even want to kill myself, I just wanted to get really high." Patient continues to pace up and down the hallway and to the nurses station. Patient offered PRN medication for agitation. Patient agreed to take agitation medication

## 2022-12-15 NOTE — [facility] Group Notes (Signed)
Adult Psychoeducational Group Note  Date:  12/15/2022 Time:  6:38 PM  Group Topic/Focus:  Spirituality:   The focus of this group is to discuss how one's spirituality can aide in recovery.  Participation Level:  Active  Participation Quality:  Appropriate and Attentive  Affect:  Appropriate  Cognitive:  Appropriate  Insight: Appropriate  Engagement in Group:  Engaged  Modes of Intervention:  Discussion  Additional Comments:   Patient was engaged throughout the entirety of group.  Victorino December 12/15/2022, 6:38 PM

## 2022-12-15 NOTE — [facility] Group Notes (Signed)
Pt did not attend wrap-up group   

## 2022-12-15 NOTE — Group Note (Signed)
Occupational Therapy Group Note  Group Topic:Coping Skills  Group Date: 12/15/2022 Start Time: 1430 End Time: 1500 Facilitators: Brantley Stage, OT   Group Description: Group encouraged increased engagement and participation through discussion and activity focused on "Coping Ahead." Patients were split up into teams and selected a card from a stack of positive coping strategies. Patients were instructed to act out/charade the coping skill for other peers to guess and receive points for their team. Discussion followed with a focus on identifying additional positive coping strategies and patients shared how they were going to cope ahead over the weekend while continuing hospitalization stay.  Therapeutic Goal(s): Identify positive vs negative coping strategies. Identify coping skills to be used during hospitalization vs coping skills outside of hospital/at home Increase participation in therapeutic group environment and promote engagement in treatment   Participation Level: Engaged   Participation Quality: Independent   Behavior: Appropriate   Speech/Thought Process: Relevant   Affect/Mood: Appropriate   Insight: Fair   Judgement: Fair   Individualization: pt was active in their participation of group discussion/activity. New skills identified  Modes of Intervention: Education  Patient Response to Interventions:  Attentive   Plan: Continue to engage patient in OT groups 2 - 3x/week.  12/15/2022  Brantley Stage, OT  [name], OT

## 2022-12-16 ENCOUNTER — Encounter (HOSPITAL_COMMUNITY): Payer: Self-pay | Admitting: Nurse Practitioner

## 2022-12-16 NOTE — Progress Notes (Signed)
D: Patient is alert, anxious, irritable. Denies SI, HI, AVH, and verbally contracts for safety. Patient reports he is "bored and just want to go home". He has attended some groups and meals today.    A: Scheduled medications administered per MD order. Support provided. Patient educated on safety on the unit and medications. Routine safety checks every 15 minutes. Patient stated understanding to tell nurse about any new physical symptoms. Patient understands to tell staff of any needs.     R: No adverse drug reactions noted. Patient verbally contracts for safety. Patient remains safe at this time and will continue to monitor.    12/16/22 0900  Psych Admission Type (Psych Patients Only)  Admission Status Involuntary  Psychosocial Assessment  Patient Complaints Anxiety;Depression  Eye Contact Fair  Facial Expression Flat  Affect Anxious;Irritable  Speech Logical/coherent  Interaction Assertive  Motor Activity Slow  Appearance/Hygiene Disheveled  Behavior Characteristics Anxious;Irritable  Mood Depressed;Anxious;Irritable  Thought Process  Coherency WDL  Content WDL  Delusions None reported or observed  Perception WDL  Hallucination None reported or observed  Judgment Limited  Confusion None  Danger to Self  Current suicidal ideation? Denies  Self-Injurious Behavior No self-injurious ideation or behavior indicators observed or expressed   Agreement Not to Harm Self Yes  Description of Agreement verbal  Danger to Others  Danger to Others None reported or observed

## 2022-12-16 NOTE — Progress Notes (Signed)
   12/16/22 2200  Psych Admission Type (Psych Patients Only)  Admission Status Involuntary  Psychosocial Assessment  Patient Complaints Anxiety  Eye Contact Fair  Facial Expression Animated  Affect Anxious  Speech Logical/coherent  Interaction Assertive  Motor Activity Other (Comment) (WNL)  Appearance/Hygiene Improved  Behavior Characteristics Cooperative;Anxious  Mood Anxious;Silly;Pleasant  Thought Process  Coherency WDL  Content WDL  Delusions None reported or observed  Perception WDL  Hallucination None reported or observed  Judgment Limited  Confusion None  Danger to Self  Current suicidal ideation? Denies  Self-Injurious Behavior No self-injurious ideation or behavior indicators observed or expressed   Agreement Not to Harm Self Yes  Description of Agreement Verbal  Danger to Others  Danger to Others None reported or observed  Danger to Others Abnormal  Harmful Behavior to others No threats or harm toward other people  Destructive Behavior No threats or harm toward property

## 2022-12-16 NOTE — Progress Notes (Signed)
Adult Psychoeducational Group Note  Date:  12/16/2022 Time:  5:21 PM  Group Topic/Focus:  Goals Group:   The focus of this group is to help patients establish daily goals to achieve during treatment and discuss how the patient can incorporate goal setting into their daily lives to aide in recovery. Orientation:   The focus of this group is to educate the patient on the purpose and policies of crisis stabilization and provide a format to answer questions about their admission.  The group details unit policies and expectations of patients while admitted.  Participation Level:  Active  Participation Quality:  Appropriate  Affect:  Appropriate  Cognitive:  Appropriate  Insight: Appropriate  Engagement in Group:  Engaged  Modes of Intervention:  Discussion  Additional Comments:  Pt attended the goals/orientation group and remained appropriate and engaged throughout the duration of the group.   Beryle Beams 12/16/2022, 5:21 PM

## 2022-12-16 NOTE — Progress Notes (Signed)
[facility] MD Progress Note  12/16/2022 1:14 PM Aaron Burke  MRN:  [id number] Principal Problem: MDD (major depressive disorder), recurrent severe, without psychosis ([place]) Diagnosis: Principal Problem:   MDD (major depressive disorder), recurrent severe, without psychosis ([place]) Active Problems:   Alcohol use disorder   GERD (gastroesophageal reflux disease)   Attention deficit hyperactivity disorder (ADHD)   GAD (generalized anxiety disorder)  Reason For Admission: Aaron Burke is a 37 yo Caucasian male with a reported past mental health history of borderline personality disorder, GAD & ADHD who was taken to the [facility] ED on 02/18 by the [facility] police dept and EMS after he made suicidal statements to his wife and child, and overdose on pills in the context of relationship stressors.  Patient was subsequently involuntarily committed and transferred to this behavioral health hospital for treatment and stabilization of his mental status.   24 hr chart review: Vital signs were reviewed with improved blood pressure noted but continues to be occasionally elevated.  Patient was locked out of the room yesterday and reportedly attended some groups with limited participation.  No as needed medication needed or used for agitation or aggression  Patient assessment note: Upon evaluation today patient continues to be irritable and guarded but seems to be more forthcoming than previously he tells me that he took the pills with an intention "to die" he tells me "I do not want to die anymore and I do not feel like that anymore" when asking him what does he want to do after discharge she responds "to find an apartment and start a new job" he feels frustrated and angry about wife leaving him and not being able to see his son after discharge, he adamantly denies any passive or active SI intention or plan and denies HI or AVH.  He tells me that he spoke with his mother and father yesterday in [place].  With  patient's permission I spoke to his mother Malachy Mood at [id number] who reported that when she spoke to the patient yesterday he seemed angry about the situation but she denies any knowledge of him continuing to have any thoughts of harming self.  She reports she has held from him statements about harming himself in the past last time was in August last year.  She is unsure about his alcohol use frequency prior to admission.  She denies him to have any history of violence or homicidal ideation towards others.  She denies any known imminent concern about patient's safety if discharged.  Discussed with patient's mother current treatment plan and discussed with her to encourage patient to comply with care during hospital stay, she agrees.  Mother denies any knowledge of patient having access to guns except what he had at the house which reportedly were removed out by his wife. At this time patient reports he does not have a place to go to after discharge or a family member and he may have to go to homeless shelter.   Total Time spent with patient: 51  Past Psychiatric History: Borderline personality d/o, ADHD  Past Medical History:  Past Medical History:  Diagnosis Date   Gallstone pancreatitis 2020   GERD (gastroesophageal reflux disease)    Plaque psoriasis    Psoriatic arthritis ([place])    Testicular torsion 2020    Past Surgical History:  Procedure Laterality Date   CHOLECYSTECTOMY     ESOPHAGOGASTRODUODENOSCOPY  2020   with dilatation   KNEE RECONSTRUCTION Left 08/30/2004   LEFT HEART CATH AND CORONARY  ANGIOGRAPHY N/A 09/29/2020   Procedure: LEFT HEART CATH AND CORONARY ANGIOGRAPHY;  Surgeon: Martinique, Peter M, MD;  Location: [facility];  Service: Cardiovascular;  Laterality: N/A;   Family History:  Family History  Problem Relation Age of Onset   Hyperlipidemia Mother    Heart attack Father        x 2, total 4 stents   Heart attack Paternal Grandfather    Bipolar disorder Other     Family Psychiatric  History: See H & P Social History:  Social History   Substance and Sexual Activity  Alcohol Use Never     Social History   Substance and Sexual Activity  Drug Use Never    Social History   Socioeconomic History   Marital status: Married    Spouse name: Not on file   Number of children: Not on file   Years of education: Not on file   Highest education level: Not on file  Occupational History   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   Social Determinants of Health   Financial Resource Strain: Not on file  Food Insecurity: No Food Insecurity (12/13/2022)   Hunger Vital Sign    Worried About Running Out of Food in the Last Year: Never true    Ran Out of Food in the Last Year: Never true  Transportation Needs: Unknown (12/13/2022)   [place] - Hydrologist (Medical): Patient refused    Lack of Transportation (Non-Medical): Patient refused  Physical Activity: Not on file  Stress: Not on file  Social Connections: Not on file   Sleep: Good  Appetite:  Poor  Current Medications: Current Facility-Administered Medications  Medication Dose Route Frequency Provider Last Rate Last Admin   alum & mag hydroxide-simeth (MAALOX/MYLANTA) 200-200-20 MG/5ML suspension 30 mL  30 mL Oral Q4H PRN Charmaine Downs C, NP       cloNIDine (CATAPRES) tablet 0.1 mg  0.1 mg Oral BID PRN Nicholes Rough, NP   0.1 mg at 12/15/22 [id number]   hydrOXYzine (ATARAX) tablet 50 mg  50 mg Oral Q6H PRN Nicholes Rough, NP   50 mg at 12/16/22 1244   magnesium hydroxide (MILK OF MAGNESIA)  suspension 30 mL  30 mL Oral Daily PRN Delfin Gant, NP       multivitamin with minerals tablet 1 tablet  1 tablet Oral Daily Nicholes Rough, NP   1 tablet at 12/16/22 0827   nicotine (NICODERM CQ - dosed in mg/24 hours) patch 14 mg  14 mg Transdermal Daily Brix Brearley, MD   14 mg at 12/16/22 0828   OLANZapine zydis (ZYPREXA) disintegrating tablet 5 mg  5 mg Oral Q8H PRN Nicholes Rough, NP   5 mg at 12/15/22 2155   And   ziprasidone (GEODON) injection 20 mg  20 mg Intramuscular PRN Nicholes Rough, NP       QUEtiapine (SEROQUEL XR) 24 hr tablet 100 mg  100 mg Oral QHS Nkwenti, Doris, NP   100 mg at 12/15/22 2043  QUEtiapine (SEROQUEL) tablet 25 mg  25 mg Oral BID Nicholes Rough, NP   25 mg at 12/16/22 0827   thiamine (Vitamin B-1) tablet 100 mg  100 mg Oral Daily Nicholes Rough, NP   100 mg at 12/16/22 0827   thiamine (VITAMIN B1) injection 100 mg  100 mg Intramuscular Once Nicholes Rough, NP        Lab Results:  No results found for this or any previous visit (from the past 62 hour(s)).   Blood Alcohol level:  Lab Results  Component Value Date   ETH 275 (H) 123456    Metabolic Disorder Labs: Lab Results  Component Value Date   HGBA1C 6.1 (H) 12/14/2022   MPG 128.37 12/14/2022   MPG 128.37 09/27/2020   No results found for: "PROLACTIN" Lab Results  Component Value Date   CHOL 292 (H) 12/14/2022   TRIG 98 12/14/2022   HDL 77 12/14/2022   CHOLHDL 3.8 12/14/2022   VLDL 20 12/14/2022   LDLCALC 195 (H) 12/14/2022   LDLCALC 222 (H) 10/30/2020    Physical Findings: AIMS:  , ,  ,  ,    CIWA:  CIWA-Ar Total: 2 COWS:     Musculoskeletal: Strength & Muscle Tone: within normal limits Gait & Station: normal Patient leans: N/A  Psychiatric Specialty Exam:  Presentation  General Appearance:  Disheveled  Eye Contact: Fair  Speech: Clear and Coherent  Speech Volume: Normal  Handedness: Right   Mood and Affect  Mood: Anxious; Angry;  Depressed  Affect: Congruent   Thought Process  Thought Processes: Coherent  Descriptions of Associations:Intact  Orientation:Full (Time, Place and Person)  Thought Content:Logical  History of Schizophrenia/Schizoaffective disorder:No data recorded Duration of Psychotic Symptoms:No data recorded Hallucinations:Hallucinations: None  Ideas of Reference:None  Suicidal Thoughts:Suicidal Thoughts: No  Homicidal Thoughts:Homicidal Thoughts: No   Sensorium  Memory: Immediate Good  Judgment: Poor  Insight: Poor   Executive Functions  Concentration: Fair  Attention Span: Fair  Recall: [place] of Knowledge: Fair  Language: Fair   Psychomotor Activity  Psychomotor Activity: Psychomotor Activity: Normal   Assets  Assets: Communication Skills   Sleep  Sleep: Sleep: Fair    Physical Exam: Physical Exam HENT:     Mouth/Throat:     Mouth: Mucous membranes are moist.  Pulmonary:     Effort: Pulmonary effort is normal.  Musculoskeletal:     Left lower leg: No edema.  Neurological:     Mental Status: He is alert and oriented to person, place, and time.  Psychiatric:        Thought Content: Thought content normal.    Review of Systems  Constitutional: Negative.  Negative for fever.  HENT: Negative.    Eyes: Negative.  Negative for blurred vision.  Respiratory: Negative.    Cardiovascular: Negative.   Gastrointestinal: Negative.  Negative for heartburn.  Genitourinary: Negative.   Musculoskeletal: Negative.   Skin: Negative.   Neurological: Negative.   Psychiatric/Behavioral:  Positive for depression and substance abuse. Negative for hallucinations, memory loss and suicidal ideas. The patient is nervous/anxious and has insomnia.    Blood pressure (!) 146/85, pulse 85, temperature (!) 97.5 F (36.4 C), temperature source Oral, resp. rate 16, height '5\' 9"'$  (1.753 m), weight 80 kg, SpO2 99 %. Body mass index is 26.05 kg/m.   Treatment  Plan Summary: Treatment Plan Summary: Daily contact with patient to assess and evaluate symptoms and progress in treatment and Medication management   Observation Level/Precautions:  15 minute checks  Laboratory:  Labs reviewed   Psychotherapy:  Unit Group sessions  Medications:  See [facility]  Consultations:  To be determined   Discharge Concerns:  Safety, medication compliance, mood stability  Estimated LOS: 5-7 days  Other:  N/A    Labs reviewed on 12/13/2022: Positive for benzos, positive for amphetamines, UA WNL, with exception of protein of 30.  CMP with elevated LFTs, CBC reviewed, EtOH level 275.  Labs ordered: TSH, hemoglobin A1c, lipid panel, vitamins B1, B12, vitamin D.  Repeat LFTs.  EKG with QTc of 458.   PLAN Safety and Monitoring: Voluntary admission to inpatient psychiatric unit for safety, stabilization and treatment Daily contact with patient to assess and evaluate symptoms and progress in treatment Patient's case to be discussed in multi-disciplinary team meeting Observation Level : q15 minute checks Vital signs: q12 hours Precautions: Safety   Long Term Goal(s): Improvement in symptoms so as ready for discharge   Short Term Goals: Ability to identify changes in lifestyle to reduce recurrence of condition will improve, Ability to disclose and discuss suicidal ideas, Ability to demonstrate self-control will improve, Ability to identify and develop effective coping behaviors will improve, Ability to maintain clinical measurements within normal limits will improve, Compliance with prescribed medications will improve, and Ability to identify triggers associated with substance abuse/mental health issues will improve   Diagnoses Principal Problem:   MDD (major depressive disorder), recurrent severe, without psychosis ([place]) Active Problems:   Alcohol use disorder   GERD (gastroesophageal reflux disease)   Attention deficit hyperactivity disorder (ADHD)   GAD (generalized  anxiety disorder)   Medications: Continue Seroquel 25 mg twice daily for anxiety and mood, continue Seroquel extended release 100 mg at bedtime for mood and depression and also help with sleep. Patient continues to refuse starting antidepressants.  Continue nicotine patch for smoking cessation.  Continue Atarax 50 mg every 6 hours as needed for anxiety and encourage patient to use.  Continue Catapres as needed for high blood pressure.  Patient denies any history of hypertension, will follow.    Other PRNS  -Continue Maalox 30 mg every 4 hrs PRN for indigestion -Continue Imodium 2-4 mg as needed for diarrhea -Continue Milk of Magnesia as needed every 6 hrs for constipation -Continue Zofran disintegrating tabs every 6 hrs PRN for nausea    Discharge Planning: Social work and case management to assist with discharge planning and identification of hospital follow-up needs prior to discharge Estimated LOS: 5-7 days Discharge Concerns: Need to establish a safety plan; Medication compliance and effectiveness Discharge Goals: Return home with outpatient referrals for mental health follow-up including medication management/psychotherapy   I certify that inpatient services furnished can reasonably be expected to improve the patient's condition.      Dian Situ, MD 12/16/2022, 1:14 PMPatient ID: Aaron Burke, male   DOB: [birth year], 37 y.o.   MRN: [id number]

## 2022-12-16 NOTE — Group Note (Signed)
Recreation Therapy Group Note   Group Topic:Problem Solving  Group Date: 12/16/2022 Start Time: 0930 End Time: 1000 Facilitators: Engelbert Sevin-McCall, LRT,CTRS Location: [address] Dayroom   Goal Area(s) Addresses:  Patient will effectively work with peer towards shared goal.  Patient will identify skills used to make activity successful.  Patient will identify how skills used during activity can be applied to reach post d/c goals.   Group Description: [employer]. In teams of 5-6, patients were given 11 craft pipe cleaners. Using the materials provided, patients were instructed to compete again the opposing team(s) to build the tallest free-standing structure from floor level. The activity was timed; difficulty increased by Probation officer as Pharmacist, hospital continued.  Systematically resources were removed with additional directions for example, placing one arm behind their back, working in silence, and shape stipulations. LRT facilitated post-activity discussion reviewing team processes and necessary communication skills involved in completion. Patients were encouraged to reflect how the skills utilized, or not utilized, in this activity can be incorporated to positively impact support systems post discharge.   Affect/Mood: N/A   Participation Level: Did not attend    Clinical Observations/Individualized Feedback:     Plan: Continue to engage patient in RT group sessions 2-3x/week.   Emerita Berkemeier-McCall, LRT,CTRS 12/16/2022 12:56 PM

## 2022-12-16 NOTE — Progress Notes (Signed)
   12/16/22 0601  15 Minute Checks  Location Bedroom  Visual Appearance Calm  Behavior Sleeping  Sleep (Behavioral Health Patients Only)  Calculate sleep? (Click Yes once per 24 hr at 0600 safety check) Yes  Documented sleep last 24 hours 10

## 2022-12-17 MED ORDER — PANTOPRAZOLE SODIUM 40 MG PO TBEC
40.0000 mg | DELAYED_RELEASE_TABLET | Freq: Every day | ORAL | Status: DC
  Administered 2022-12-17 – 2022-12-20 (×4): 40 mg via ORAL
  Filled 2022-12-17 (×6): qty 1

## 2022-12-17 MED ORDER — ONDANSETRON 4 MG PO TBDP
8.0000 mg | ORAL_TABLET | Freq: Three times a day (TID) | ORAL | Status: DC | PRN
  Administered 2022-12-17 – 2022-12-18 (×2): 8 mg via ORAL
  Filled 2022-12-17: qty 2

## 2022-12-17 MED ORDER — TRAZODONE HCL 50 MG PO TABS
50.0000 mg | ORAL_TABLET | Freq: Every evening | ORAL | Status: DC | PRN
  Administered 2022-12-17 – 2022-12-20 (×4): 50 mg via ORAL
  Filled 2022-12-17 (×5): qty 1

## 2022-12-17 MED ORDER — ONDANSETRON 4 MG PO TBDP
ORAL_TABLET | ORAL | Status: AC
  Filled 2022-12-17: qty 1

## 2022-12-17 MED ORDER — ONDANSETRON 4 MG PO TBDP
ORAL_TABLET | ORAL | Status: AC
  Filled 2022-12-17: qty 2

## 2022-12-17 MED ORDER — TRAZODONE HCL 50 MG PO TABS: 50.0000 mg | ORAL_TABLET | Freq: Once | ORAL | Status: DC | PRN

## 2022-12-17 MED ORDER — APREMILAST 30 MG PO TABS
1.0000 | ORAL_TABLET | Freq: Two times a day (BID) | ORAL | Status: DC
  Administered 2022-12-17 – 2022-12-20 (×5): 30 mg via ORAL

## 2022-12-17 NOTE — Progress Notes (Signed)
D: Patient alert and oriented. Affect/mood reported as improving. Denies SI, HI, AVH, and pain. Patient goal, "to get out of here." Patient continues to be short tempered with the NP and any staff who challenge anything he has to say.   A: Scheduled medication administered to patient, per MD orders. Support and encouragement provided. Routine safety checks conducted every 15 minutes. Patient informed to notify staff with problems or concerns.   R: No adverse drug reactions noted. Patient contracts for safety at this time. Patient compliant with medications and treatment plan. Patient remains safe at this time.

## 2022-12-17 NOTE — Progress Notes (Signed)
[facility] MD Progress Note  12/17/2022 11:00 AM Aaron Burke  MRN:  [id number] Principal Problem: MDD (major depressive disorder), recurrent severe, without psychosis ([place]) Diagnosis: Principal Problem:   MDD (major depressive disorder), recurrent severe, without psychosis ([place]) Active Problems:   Alcohol use disorder   GERD (gastroesophageal reflux disease)   Attention deficit hyperactivity disorder (ADHD)   GAD (generalized anxiety disorder)  Reason For Admission: Aaron Burke is a 37 yo Caucasian male with a reported past mental health history of borderline personality disorder, GAD & ADHD who was taken to the [facility] ED on 02/18 by the [facility] police dept and EMS after he made suicidal statements to his wife and child, and overdose on pills in the context of relationship stressors.  Patient was subsequently involuntarily committed and transferred to this behavioral health hospital for treatment and stabilization of his mental status.   24 hr chart review: Vital signs currently within normal limits. Patient complains on nausea and vomiting during the night related to GERD symptoms. Staff report patient slept 9.25 hours overnight. Staff continue to report patient has being irritable and short tempered. He attended several groups on unit yesterday. Remains medication compliant; no PRN medication given.   Patient assessment note: Patient assessed in his room where he is laying in bed. He presents irritable and guarded. Minimal insight. He reports being up all night throwing up related to GERD; he denies any withdrawal symptoms stating he 'doesn't drink that often' but had a lot to drink the day of the incident (2 beers, bottle of whiskey) because I was trying to die, I don't know why you people don't understand that. My wife told me she was leaving me and taking my kid'. He states he is 'better now' and is requesting discharge to 'find an apartment and start a new job'. When asked about mental  stat before admission compared to now he becomes increasingly agitated and states, 'what's the point, you're going to hold me in here until Tuesday so what's the point. You people love ruining peoples lives. You're holding me here because you can. I've done your stupid curriculum and listened to all this therapeutic mumbo jumbo'. Provider spent extensive time attempting to process therapeutic programming, target symptoms, and unit expectations. Provider attempted to explain to the patient safety concerns as his reason for admission and discharge planning process in which he was not receptive to. He continues to deny any SI/HI/AVH. States he has a job he is starting in [employer] and plans to secure housing locally. He denies any active substance abuse; other than n/v, and agitation no obvious signs of withdrawal noted. No tremor, visible perspiration noted. Vital signs currently stable. Reports current outpatient therapy x1 q2 weeks. Assessment stopped due to patient's ongoing agitation and mood lability.   Total Time spent with patient: 51  Past Psychiatric History: Borderline personality d/o, ADHD  Past Medical History:  Past Medical History:  Diagnosis Date   Gallstone pancreatitis 2020   GERD (gastroesophageal reflux disease)    Plaque psoriasis    Psoriatic arthritis ([place])    Testicular torsion 2020    Past Surgical History:  Procedure Laterality Date   CHOLECYSTECTOMY     ESOPHAGOGASTRODUODENOSCOPY  2020   with dilatation   KNEE RECONSTRUCTION Left 08/30/2004   LEFT HEART CATH AND CORONARY ANGIOGRAPHY N/A 09/29/2020   Procedure: LEFT HEART CATH AND CORONARY ANGIOGRAPHY;  Surgeon: Martinique, Peter M, MD;  Location: [facility];  Service: Cardiovascular;  Laterality: N/A;  Family History:  Family History  Problem Relation Age of Onset   Hyperlipidemia Mother    Heart attack Father        x 2, total 4 stents   Heart attack Paternal Grandfather    Bipolar disorder  Other    Family Psychiatric  History: See H & P Social History:  Social History   Substance and Sexual Activity  Alcohol Use Never     Social History   Substance and Sexual Activity  Drug Use Never    Social History   Socioeconomic History   Marital status: Married    Spouse name: Not on file   Number of children: Not on file   Years of education: Not on file   Highest education level: Not on file  Occupational History   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   Social Determinants of Health   Financial Resource Strain: Not on file  Food Insecurity: No Food Insecurity (12/13/2022)   Hunger Vital Sign    Worried About Running Out of Food in the Last Year: Never true    Ran Out of Food in the Last Year: Never true  Transportation Needs: Unknown (12/13/2022)   PRAPARE - Hydrologist (Medical): Patient refused    Lack of Transportation (Non-Medical): Patient refused  Physical Activity: Not on file  Stress: Not on file  Social Connections: Not on file   Sleep: Good  Appetite:  Poor  Current Medications: Current Facility-Administered Medications  Medication Dose Route Frequency Provider Last Rate Last Admin   alum & mag hydroxide-simeth (MAALOX/MYLANTA) 200-200-20 MG/5ML suspension 30 mL  30 mL Oral Q4H PRN Charmaine Downs C, NP   30 mL at 12/17/22 0544   cloNIDine (CATAPRES) tablet 0.1 mg  0.1 mg Oral BID PRN Nicholes Rough, NP   0.1 mg at 12/17/22 0549   hydrOXYzine (ATARAX) tablet 50 mg  50 mg Oral Q6H PRN Nicholes Rough, NP   50 mg at 12/17/22 1006   magnesium  hydroxide (MILK OF MAGNESIA) suspension 30 mL  30 mL Oral Daily PRN Delfin Gant, NP       multivitamin with minerals tablet 1 tablet  1 tablet Oral Daily Nicholes Rough, NP   1 tablet at 12/17/22 1005   nicotine (NICODERM CQ - dosed in mg/24 hours) patch 14 mg  14 mg Transdermal Daily Attiah, Nadir, MD   14 mg at 12/17/22 1006   OLANZapine zydis (ZYPREXA) disintegrating tablet 5 mg  5 mg Oral Q8H PRN Nicholes Rough, NP   5 mg at 12/16/22 1818   And   ziprasidone (GEODON) injection 20 mg  20 mg Intramuscular PRN Nicholes Rough, NP       ondansetron (ZOFRAN-ODT) 4 MG disintegrating tablet            ondansetron (ZOFRAN-ODT) disintegrating tablet 8 mg  8 mg Oral Q8H PRN Attiah, Nadir, MD   8 mg at 12/17/22 0940   pantoprazole (PROTONIX) EC tablet 40 mg  40 mg Oral Daily Leevy-Johnson,  Amir Glaus A, NP       QUEtiapine (SEROQUEL XR) 24 hr tablet 100 mg  100 mg Oral QHS Nkwenti, Doris, NP   100 mg at 12/16/22 2109   QUEtiapine (SEROQUEL) tablet 25 mg  25 mg Oral BID Nicholes Rough, NP   25 mg at 12/17/22 1005   thiamine (Vitamin B-1) tablet 100 mg  100 mg Oral Daily Nicholes Rough, NP   100 mg at 12/17/22 1005   thiamine (VITAMIN B1) injection 100 mg  100 mg Intramuscular Once Nicholes Rough, NP        Lab Results:  No results found for this or any previous visit (from the past 43 hour(s)).   Blood Alcohol level:  Lab Results  Component Value Date   ETH 275 (H) 123456    Metabolic Disorder Labs: Lab Results  Component Value Date   HGBA1C 6.1 (H) 12/14/2022   MPG 128.37 12/14/2022   MPG 128.37 09/27/2020   No results found for: "PROLACTIN" Lab Results  Component Value Date   CHOL 292 (H) 12/14/2022   TRIG 98 12/14/2022   HDL 77 12/14/2022   CHOLHDL 3.8 12/14/2022   VLDL 20 12/14/2022   LDLCALC 195 (H) 12/14/2022   LDLCALC 222 (H) 10/30/2020    Physical Findings: AIMS:  , ,  ,  ,    CIWA:  CIWA-Ar Total: 1 COWS:     Musculoskeletal: Strength & Muscle Tone: within  normal limits Gait & Station: normal Patient leans: N/A  Psychiatric Specialty Exam:  Presentation  General Appearance:  Disheveled  Eye Contact: Fair  Speech: Clear and Coherent  Speech Volume: Normal  Handedness: Right  Mood and Affect  Mood: Angry; Irritable; Labile  Affect: Blunt; Labile; Inappropriate; Restricted  Thought Process  Thought Processes: Coherent  Descriptions of Associations:Intact  Orientation:Full (Time, Place and Person)  Thought Content:Tangential  History of Schizophrenia/Schizoaffective disorder:No data recorded Duration of Psychotic Symptoms:No data recorded Hallucinations:Hallucinations: None   Ideas of Reference:None  Suicidal Thoughts:Suicidal Thoughts: No   Homicidal Thoughts:Homicidal Thoughts: No  Sensorium  Memory: Immediate Fair; Recent Fair  Judgment: Poor  Insight: Shallow  Executive Functions  Concentration: Fair  Attention Span: Fair  Recall: [place] of Knowledge: Good  Language: Fair  Psychomotor Activity  Psychomotor Activity: Psychomotor Activity: Normal   Assets  Assets: Financial Resources/Insurance; Resilience; Vocational/Educational  Sleep  Sleep: Sleep: Fair   Physical Exam: Physical Exam HENT:     Mouth/Throat:     Mouth: Mucous membranes are moist.  Pulmonary:     Effort: Pulmonary effort is normal.  Musculoskeletal:     Left lower leg: No edema.  Neurological:     Mental Status: He is alert and oriented to person, place, and time.  Psychiatric:        Thought Content: Thought content normal.    Review of Systems  Constitutional: Negative.  Negative for fever.  HENT: Negative.    Eyes: Negative.  Negative for blurred vision.  Respiratory: Negative.    Cardiovascular: Negative.   Gastrointestinal: Negative.  Negative for heartburn.  Genitourinary: Negative.   Musculoskeletal: Negative.   Skin: Negative.   Neurological: Negative.    Psychiatric/Behavioral:  Positive for depression and substance abuse. Negative for hallucinations, memory loss and suicidal ideas. The patient is nervous/anxious and has insomnia.    Blood pressure 132/82, pulse 67, temperature 98.2 F (36.8 C), temperature source Oral, resp. rate 16, height '5\' 9"'$  (1.753 m), weight 80 kg, SpO2 97 %. Body mass index is  26.05 kg/m.  Treatment Plan Summary: Daily contact with patient to assess and evaluate symptoms and progress in treatment and Medication management   Observation Level/Precautions:  15 minute checks  Laboratory:  Labs reviewed   Psychotherapy:  Unit Group sessions  Medications:  See [facility]  Consultations:  To be determined   Discharge Concerns:  Safety, medication compliance, mood stability  Estimated LOS: 5-7 days  Other:  N/A    Labs reviewed on 12/13/2022: Positive for benzos, positive for amphetamines, UA WNL, with exception of protein of 30.  CMP with elevated LFTs, CBC reviewed, EtOH level 275.  Labs ordered: TSH, hemoglobin A1c, lipid panel, vitamins B1, B12, vitamin D.  Repeat LFTs 12/18/22.  EKG with QTc of 458.   PLAN Safety and Monitoring: Voluntary admission to inpatient psychiatric unit for safety, stabilization and treatment Daily contact with patient to assess and evaluate symptoms and progress in treatment Patient's case to be discussed in multi-disciplinary team meeting Observation Level : q15 minute checks Vital signs: q12 hours Precautions: Safety   Long Term Goal(s): Improvement in symptoms so as ready for discharge   Short Term Goals: Ability to identify changes in lifestyle to reduce recurrence of condition will improve, Ability to disclose and discuss suicidal ideas, Ability to demonstrate self-control will improve, Ability to identify and develop effective coping behaviors will improve, Ability to maintain clinical measurements within normal limits will improve, Compliance with prescribed medications will improve,  and Ability to identify triggers associated with substance abuse/mental health issues will improve   Diagnoses Principal Problem:   MDD (major depressive disorder), recurrent severe, without psychosis ([place]) Active Problems:   Alcohol use disorder   GERD (gastroesophageal reflux disease)   Attention deficit hyperactivity disorder (ADHD)   GAD (generalized anxiety disorder)   Medications: Start:  - Protonix 40 mg daily for GERD symptoms; home medication  Continue:  - Seroquel 25 mg twice daily for anxiety and mood, continue Seroquel extended release 100 mg at bedtime for mood and depression and also help with sleep. Patient continues to refuse starting antidepressants. - Nicotine patch for smoking cessation.  Other PRNS - Maalox 30 mg every 4 hrs PRN for indigestion - Imodium 2-4 mg as needed for diarrhea - Milk of Magnesia as needed every 6 hrs for constipation - Zofran disintegrating tabs every 6 hrs PRN for nausea  - Atarax 50 mg every 6 hours as needed for anxiety and encourage patient to use. - Catapres as needed for high blood pressure.  Patient denies any history of hypertension, will follow.   Discharge Planning: Social work and case management to assist with discharge planning and identification of hospital follow-up needs prior to discharge Estimated LOS: 5-7 days Discharge Concerns: Need to establish a safety plan; Medication compliance and effectiveness Discharge Goals: Return home with outpatient referrals for mental health follow-up including medication management/psychotherapy   I certify that inpatient services furnished can reasonably be expected to improve the patient's condition.      Inda Merlin, NP 12/17/2022, 11:00 AM  Patient ID: Aaron Burke, male   DOB: 1986/04/24, 37 y.o.   MRN: [id number]

## 2022-12-17 NOTE — Progress Notes (Signed)
   12/17/22 2100  Psych Admission Type (Psych Patients Only)  Admission Status Involuntary  Psychosocial Assessment  Patient Complaints Anxiety;Depression  Eye Contact Brief  Facial Expression Flat  Affect Appropriate to circumstance;Depressed  Speech Logical/coherent  Interaction Assertive  Motor Activity Slow  Appearance/Hygiene Improved  Behavior Characteristics Cooperative;Appropriate to situation  Mood Pleasant;Depressed  Thought Process  Coherency WDL  Content WDL  Delusions None reported or observed  Perception WDL  Hallucination None reported or observed  Judgment Limited  Confusion None  Danger to Self  Current suicidal ideation? Denies  Agreement Not to Harm Self Yes  Description of Agreement verbal  Danger to Others  Danger to Others None reported or observed

## 2022-12-17 NOTE — Progress Notes (Signed)
Pt c/o nausea and vomiting this am. Zofran order obtained and medication given. VSS.

## 2022-12-17 NOTE — Progress Notes (Signed)
   12/17/22 0543  15 Minute Checks  Location Hallway  Visual Appearance Calm  Behavior Composed  Sleep (Behavioral Health Patients Only)  Calculate sleep? (Click Yes once per 24 hr at 0600 safety check) Yes  Documented sleep last 24 hours 9.25

## 2022-12-17 NOTE — Group Note (Signed)
Date:  12/17/2022 Time:  11:44 PM  Group Topic/Focus:  Wrap-Up Group:   The focus of this group is to help patients review their daily goal of treatment and discuss progress on daily workbooks.    Participation Level:  Active  Participation Quality:  Appropriate  Affect:  Appropriate  Cognitive:  Appropriate  Insight: Appropriate  Engagement in Group:  Engaged  Modes of Intervention:  Education and Exploration  Additional Comments:  Patient attended and participated in group tonight. He reports that today he learned that after he is finish with treatment he could stare all over again right.  Salley Scarlet [facility] 12/17/2022, 11:44 PM

## 2022-12-17 NOTE — Plan of Care (Signed)
  Problem: Education: Goal: Knowledge of [facility] Education information/materials will improve Outcome: Progressing Goal: Emotional status will improve Outcome: Progressing Goal: Mental status will improve Outcome: Progressing Goal: Verbalization of understanding the information provided will improve Outcome: Progressing   Problem: Safety: Goal: Periods of time without injury will increase Outcome: Progressing   Problem: Coping: Goal: Coping ability will improve Outcome: Progressing Goal: Will verbalize feelings Outcome: Progressing   Problem: Health Behavior/Discharge Planning: Goal: Identification of resources available to assist in meeting health care needs will improve Outcome: Progressing   Problem: Medication: Goal: Compliance with prescribed medication regimen will improve Outcome: Progressing   Problem: Self-Concept: Goal: Ability to identify factors that promote anxiety will improve Outcome: Progressing Goal: Level of anxiety will decrease Outcome: Progressing Goal: Ability to modify response to factors that promote anxiety will improve Outcome: Progressing

## 2022-12-17 NOTE — Group Note (Unsigned)
Date:  12/17/2022 Time:  10:36 PM  Group Topic/Focus:  Wrap-Up Group:   The focus of this group is to help patients review their daily goal of treatment and discuss progress on daily workbooks.     Participation Level:  {BHH PARTICIPATION HD:[id number]  Participation Quality:  {BHH PARTICIPATION QUALITY:22265}  Affect:  {BHH AFFECT:22266}  Cognitive:  {BHH COGNITIVE:22267}  Insight: {BHH Insight2:20797}  Engagement in Group:  {BHH ENGAGEMENT IN JY:[id number]  Modes of Intervention:  {BHH MODES OF INTERVENTION:22269}  Additional Comments:  ***  Debe Coder 12/17/2022, 10:36 PM

## 2022-12-18 LAB — HEPATIC FUNCTION PANEL
ALT: 78 U/L — ABNORMAL HIGH (ref 0–44)
AST: 42 U/L — ABNORMAL HIGH (ref 15–41)
Albumin: 4 g/dL (ref 3.5–5.0)
Alkaline Phosphatase: 78 U/L (ref 38–126)
Bilirubin, Direct: <0.1 mg/dL (ref 0.0–0.2)
Total Bilirubin: 0.3 mg/dL (ref 0.3–1.2)
Total Protein: 7.3 g/dL (ref 6.5–8.1)

## 2022-12-18 MED ORDER — NICOTINE POLACRILEX 2 MG MT GUM
CHEWING_GUM | OROMUCOSAL | Status: AC
  Filled 2022-12-18: qty 1

## 2022-12-18 MED ORDER — TRAZODONE HCL 50 MG PO TABS
50.0000 mg | ORAL_TABLET | Freq: Once | ORAL | Status: AC
  Administered 2022-12-18: 50 mg via ORAL
  Filled 2022-12-18: qty 1

## 2022-12-18 MED ORDER — NICOTINE POLACRILEX 2 MG MT GUM
2.0000 mg | CHEWING_GUM | OROMUCOSAL | Status: DC | PRN
  Administered 2022-12-19 – 2022-12-20 (×3): 2 mg via ORAL
  Filled 2022-12-18 (×5): qty 1

## 2022-12-18 NOTE — Progress Notes (Addendum)
D: Patient alert and oriented. Affect/mood reported as improving. Denies SI, HI, AVH, and pain. Patient goal, "to go home." Rates depression 2/10, hopelessness 1/10, anxiety 9/10.   A: Scheduled medication administered to patient, per MD orders. Support and encouragement provided. Routine safety checks conducted every 15 minutes. Patient informed to notify staff with problems or concerns.   R: No adverse drug reactions noted. Patient contracts for safety at this time. Patient compliant with medications and treatment plan. Patient receptive, calm and cooperative. Patient interacts well with others on unit. Patient remains safe at this time.

## 2022-12-18 NOTE — Progress Notes (Signed)
   12/18/22 2246  Psych Admission Type (Psych Patients Only)  Admission Status Involuntary  Psychosocial Assessment  Patient Complaints Insomnia;Anxiety  Eye Contact Brief  Facial Expression Flat  Affect Appropriate to circumstance;Depressed  Speech Logical/coherent  Interaction Assertive  Motor Activity Slow  Appearance/Hygiene Improved  Behavior Characteristics Appropriate to situation;Anxious  Mood Preoccupied;Pleasant;Anxious  Thought Process  Coherency WDL  Content WDL  Delusions None reported or observed  Perception WDL  Hallucination None reported or observed  Judgment Limited  Confusion None  Danger to Self  Current suicidal ideation? Denies  Agreement Not to Harm Self Yes  Description of Agreement verbal  Danger to Others  Danger to Others None reported or observed

## 2022-12-18 NOTE — Group Note (Signed)
LCSW Group Therapy Note  Group Date: 12/18/2022 Start Time: [id number] End Time: 1100   Type of Therapy and Topic:  Group Therapy - How To Cope with Nervousness about Discharge   Participation Level:  Did Not Attend   Description of Group This process group involved identification of patients' feelings about discharge. Some of them are scheduled to be discharged soon, while others are new admissions, but each of them was asked to share thoughts and feelings surrounding discharge from the hospital. One common theme was that they are excited at the prospect of going home, while another was that many of them are apprehensive about sharing why they were hospitalized. Patients were given the opportunity to discuss these feelings with their peers in preparation for discharge.  Therapeutic Goals  Patient will identify their overall feelings about pending discharge. Patient will think about how they might proactively address issues that they believe will once again arise once they get home (i.e. with parents). Patients will participate in discussion about having hope for change.   Summary of Patient Progress:  did not attend   Therapeutic Modalities Cognitive Behavioral Therapy   Ardeth Perfect 12/18/2022  3:17 PM

## 2022-12-18 NOTE — BHH Group Notes (Signed)
Adult Psychoeducational Group  Date:  12/11/2022 Time: 1300-1400  Group Topic/Focus: Continuation of the group from Saturday. Looking at the lists that were created and talking about what needs to be done with the homework of 30 positives about themselves.                                     Talking about taking their power back and helping themselves to develop a positive self esteem.      Participation Quality:  did not attend  Paulino Rily

## 2022-12-18 NOTE — Progress Notes (Signed)
Writer saw the on-boarding documents for his new job and confirmed the new job he has lined up.

## 2022-12-18 NOTE — Progress Notes (Signed)
   12/18/22 0544  15 Minute Checks  Location Bedroom  Visual Appearance Calm  Behavior Composed  Sleep (Behavioral Health Patients Only)  Calculate sleep? (Click Yes once per 24 hr at 0600 safety check) Yes  Documented sleep last 24 hours 2.75

## 2022-12-18 NOTE — Progress Notes (Signed)
[facility] MD Progress Note  12/18/2022 9:30 AM Aaron Burke  MRN:  [id number] Principal Problem: MDD (major depressive disorder), recurrent severe, without psychosis ([place]) Diagnosis: Principal Problem:   MDD (major depressive disorder), recurrent severe, without psychosis ([place]) Active Problems:   Alcohol use disorder   GERD (gastroesophageal reflux disease)   Attention deficit hyperactivity disorder (ADHD)   GAD (generalized anxiety disorder)  Reason For Admission: Aaron Burke is a 37 yo Caucasian male with a reported past mental health history of borderline personality disorder, GAD & ADHD who was taken to the [facility] ED on 02/18 by the [facility] police dept and EMS after he made suicidal statements to his wife and child, and overdose on pills in the context of relationship stressors.  Patient was subsequently involuntarily committed and transferred to this behavioral health hospital for treatment and stabilization of his mental status.   24 hr chart review: Vital signs currently within normal limits; pt received PRN Clonidine for bp 155/110 0630. Patient complains about nausea and vomiting during the night related to GERD symptoms. Staff report patient slept 9.25 hours overnight. Staff continue to report patient has being irritable and short tempered. He attended several groups on unit yesterday. Remains medication compliant; PRN Hydroxyzine 50 mg for anxiety, Olanzapine 5 mg for agitation, Ondansetron 8 mg ODT, Trazodone 50 mg for sleep given.   Patient assessment note: Patient observed more visible on the unit attending morning group. Staff report patient has been attending activities and meals off the unit. On assessment patient discussed it being 'weird getting out of here and not being married anymore'. States he was with his wife 74 years, married 60. He becomes tearful when discussing wife then states he feels like it's a 'new beginning' that he is able to start over. He identifies his  mother as his primary support. States he has a new job that he is scheduled to start this week; passed certification for Insurance 2 Wednesdays ago and completed finger prints and background check. Has to fly to [place] to go to corporate. 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 Patient states he is able to provide proof via his phone to staff to confirm information.   Patient reports general decline in his mental health over years related to birth of his son stating he feels wife loves him less, siblings don't talk to him anymore because of the stepmother, and volatile relationship with his father. He mentions his father has been 'screwing with his head lately. Telling people things that aren't true (he's a drug addict). Endorses history of cocaine use in college; denies any recent use, UDS-. States father called him on the unit yesterday to ask about recent attempt where he asked if it was 'a real attempt or a cry for help' when patient stated he 'wanted to die' he reports his father said 'well why didn't you use a gun?'. Patient became visibly upset discussing topic becoming tearful, guarded, and increasingly irritable.      Endorses insight on reasons why wife took out 5b; reporting hopeless state unable to quantify length of time then states 'about 2 days triggered after wife informed him she was filing for divorce after the two were leaving [employer] play. He reports ongoing contact with wife while moving items from the home. He becomes visibly tearful when discussing.  Reports history of mood swings 'all of my life. I grew up in single parent home, there was no affordability  for therapist'. Denies any history of violence or change in mood swings; feels they've been progressively better since being placed on Seroquel August 2023. Couple has been in marriage counseling over past 4 months; unwilling to state reasons why. He reports  currently receiving outpatient therapy via [facility] off [address] in [place], [place] once every 2 weeks because therapist is 'extremely booked'; unable to recall specific name of therapist. Provider attempted to discuss possibly increasing services in which he became visibly agitated and irritable. He reports having a 'rough night' where he was unable to sleep due to increased anxiety, 'overthinking', and vomited x1 that he attributes to 'eating too much ice cream'. He denies any active withdrawals; endorses having headaches, visible tremor noted in extremities, mood swings, with some nausea/vomiting. Denies any history of Dts or withdrawals. He reports drinking 3/7 days prior to admission. He currently rates anxiety 9/10, depression 2/10 with 10 being the worse; last received PRN Hydroxyzine 50 mg last night before bed. He is denying any suicidal or homicidal ideations, auditory or visual hallucinations. He denies any safety concerns.   Of note patient initially presented more calm during assessment providing information about plans upon discharge including name of job, going to hotel until able to locate apartment. He reports ongoing contact with his wife in efforts to obtain personal items; states terms of 50 b do not include 'no contact just no harrassment'; court date is set for March 1. Once pt learned he was not being discharged today he became increasing agitated and disrespectful towards provider. Patient has PTSD documented in chart, and patient admitted to having nightmares and issues with sleep where he was visibly tearful; provider offered Prazosin and attempted to discuss target symptoms where he became oppositional. Assessment stopped due to patient's ongoing agitation and mood lability. Team will discuss with patient at later time.   1330: staff report patient did provide proof of employment and itinerary for the week.   Total Time spent with patient: 45  Past Psychiatric History:  Borderline personality d/o, ADHD  Past Medical History:  Past Medical History:  Diagnosis Date   Gallstone pancreatitis 2020   GERD (gastroesophageal reflux disease)    Plaque psoriasis    Psoriatic arthritis ([place])    Testicular torsion 2020    Past Surgical History:  Procedure Laterality Date   CHOLECYSTECTOMY     ESOPHAGOGASTRODUODENOSCOPY  2020   with dilatation   KNEE RECONSTRUCTION Left 08/30/2004   LEFT HEART CATH AND CORONARY ANGIOGRAPHY N/A 09/29/2020   Procedure: LEFT HEART CATH AND CORONARY ANGIOGRAPHY;  Surgeon: Martinique, Peter M, MD;  Location: [facility];  Service: Cardiovascular;  Laterality: N/A;   Family History:  Family History  Problem Relation Age of Onset   Hyperlipidemia Mother    Heart attack Father        x 2, total 4 stents   Heart attack Paternal Grandfather    Bipolar disorder Other    Family Psychiatric  History: See H & P Social History:  Social History   Substance and Sexual Activity  Alcohol Use Never     Social History   Substance and Sexual Activity  Drug Use Never    Social History   Socioeconomic History   Marital status: Married    Spouse name: Not on file   Number of children: Not on file   Years of education: Not on file   Highest education level: Not on file  Occupational History   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   Social Determinants of Health   Financial Resource Strain: Not on file  Food Insecurity: No Food Insecurity (12/13/2022)   Hunger Vital Sign    Worried About Running Out of Food in the Last Year: Never true    Ran Out of  Food in the Last Year: Never true  Transportation Needs: Unknown (12/13/2022)   [place] - Hydrologist (Medical): Patient refused    Lack of Transportation (Non-Medical): Patient refused  Physical Activity: Not on file  Stress: Not on file  Social Connections: Not on file   Sleep: Good  Appetite:  Poor  Current Medications: Current Facility-Administered Medications  Medication Dose Route Frequency Provider Last Rate Last Admin   alum & mag hydroxide-simeth (MAALOX/MYLANTA) 200-200-20 MG/5ML suspension 30 mL  30 mL Oral Q4H PRN Cleatrice Burke, Josephine C, NP   30 mL at 12/17/22 0544   Apremilast TABS 30 mg  1 tablet Oral BID Ajibola, Ene A, NP   30 mg at 12/18/22 [id number]   cloNIDine (CATAPRES) tablet 0.1 mg  0.1 mg Oral BID PRN Nicholes Rough, NP   0.1 mg at 12/18/22 0631   hydrOXYzine (ATARAX) tablet 50 mg  50 mg Oral Q6H PRN Nicholes Rough, NP   50 mg at 12/17/22 2155   magnesium hydroxide (MILK OF MAGNESIA) suspension 30 mL  30 mL Oral Daily PRN Delfin Gant, NP       multivitamin with minerals tablet 1 tablet  1 tablet Oral Daily Nicholes Rough, NP   1 tablet at 12/18/22 0809   nicotine (NICODERM CQ - dosed in mg/24 hours) patch 14 mg  14 mg Transdermal Daily Attiah, Nadir, MD   14 mg at 12/18/22 0813   nicotine polacrilex (NICORETTE) 2 MG gum            OLANZapine zydis (ZYPREXA) disintegrating tablet 5 mg  5 mg Oral Q8H PRN Nicholes Rough, NP   5 mg at 12/17/22 1427   And   ziprasidone (GEODON) injection 20 mg  20 mg Intramuscular PRN Nicholes Rough, NP       ondansetron (ZOFRAN-ODT) disintegrating tablet 8 mg  8 mg Oral Q8H PRN Winfred Leeds, Nadir, MD   8 mg at 12/18/22 0213   pantoprazole (PROTONIX) EC tablet 40 mg  40 mg Oral Daily Leevy-Johnson, Leandrea Ackley A, NP   40 mg at 12/18/22 0809   QUEtiapine (SEROQUEL XR) 24 hr tablet 100 mg  100 mg Oral QHS Nkwenti, Doris, NP   100 mg at 12/17/22 2155   QUEtiapine (SEROQUEL) tablet 25 mg  25 mg Oral BID Nicholes Rough, NP    25 mg at 12/18/22 0809   thiamine (Vitamin B-1) tablet 100 mg  100 mg Oral Daily Nicholes Rough, NP   100 mg at 12/18/22 [id number]   thiamine (VITAMIN B1) injection 100 mg  100 mg Intramuscular Once Nicholes Rough, NP       traZODone (DESYREL) tablet 50 mg  50 mg Oral QHS PRN Ajibola, Ene A, NP   50  mg at 12/17/22 2358    Lab Results:  No results found for this or any previous visit (from the past 48 hour(s)).   Blood Alcohol level:  Lab Results  Component Value Date   ETH 275 (H) 123456    Metabolic Disorder Labs: Lab Results  Component Value Date   HGBA1C 6.1 (H) 12/14/2022   MPG 128.37 12/14/2022   MPG 128.37 09/27/2020   No results found for: "PROLACTIN" Lab Results  Component Value Date   CHOL 292 (H) 12/14/2022   TRIG 98 12/14/2022   HDL 77 12/14/2022   CHOLHDL 3.8 12/14/2022   VLDL 20 12/14/2022   LDLCALC 195 (H) 12/14/2022   LDLCALC 222 (H) 10/30/2020    Physical Findings: AIMS:  , ,  ,  ,    CIWA:  CIWA-Ar Total: 1 COWS:     Musculoskeletal: Strength & Muscle Tone: within normal limits Gait & Station: normal Patient leans: N/A  Psychiatric Specialty Exam:  Presentation  General Appearance:  Disheveled  Eye Contact: Fair  Speech: Clear and Coherent  Speech Volume: Normal  Handedness: Right  Mood and Affect  Mood: Angry; Irritable; Labile  Affect: Blunt; Labile; Inappropriate; Restricted  Thought Process  Thought Processes: Coherent  Descriptions of Associations:Intact  Orientation:Full (Time, Place and Person)  Thought Content:Tangential  History of Schizophrenia/Schizoaffective disorder:No data recorded Duration of Psychotic Symptoms:No data recorded Hallucinations:Hallucinations: None   Ideas of Reference:None  Suicidal Thoughts:Suicidal Thoughts: No   Homicidal Thoughts:Homicidal Thoughts: No  Sensorium  Memory: Immediate Fair; Recent Fair  Judgment: Poor  Insight: Shallow  Executive Functions   Concentration: Fair  Attention Span: Fair  Recall: [place] of Knowledge: Good  Language: Fair  Psychomotor Activity  Psychomotor Activity: Psychomotor Activity: Normal   Assets  Assets: Financial Resources/Insurance; Resilience; Vocational/Educational  Sleep  Sleep: Sleep: Fair   Physical Exam: Physical Exam HENT:     Mouth/Throat:     Mouth: Mucous membranes are moist.  Pulmonary:     Effort: Pulmonary effort is normal.  Musculoskeletal:     Left lower leg: No edema.  Neurological:     Mental Status: He is alert and oriented to person, place, and time.  Psychiatric:        Thought Content: Thought content normal.    Review of Systems  Constitutional: Negative.  Negative for fever.  HENT: Negative.    Eyes: Negative.  Negative for blurred vision.  Respiratory: Negative.    Cardiovascular: Negative.   Gastrointestinal: Negative.  Negative for heartburn.  Genitourinary: Negative.   Musculoskeletal: Negative.   Skin: Negative.   Neurological: Negative.   Psychiatric/Behavioral:  Positive for depression and substance abuse. Negative for hallucinations, memory loss and suicidal ideas. The patient is nervous/anxious and has insomnia.    Blood pressure 122/83, pulse 78, temperature 97.7 F (36.5 C), temperature source Oral, resp. rate 16, height '5\' 9"'$  (1.753 m), weight 80 kg, SpO2 96 %. Body mass index is 26.05 kg/m.  Treatment Plan Summary: Daily contact with patient to assess and evaluate symptoms and progress in treatment and Medication management   Observation Level/Precautions:  15 minute checks  Laboratory:  Labs reviewed   Psychotherapy:  Unit Group sessions  Medications:  See [facility]  Consultations:  To be determined   Discharge Concerns:  Safety, medication compliance, mood stability  Estimated LOS: 5-7 days  Other:  N/A    Labs reviewed on 12/13/2022: Positive for benzos, positive for amphetamines, UA WNL, with exception of protein of 30.  CMP with elevated LFTs, CBC reviewed, EtOH level 275.  Labs ordered: TSH, hemoglobin A1c, lipid panel, vitamins B1, B12, vitamin D.  Repeat LFTs 12/18/22.  EKG with QTc of 458.   PLAN Safety and Monitoring: Voluntary admission to inpatient psychiatric unit for safety, stabilization and treatment Daily contact with patient to assess and evaluate symptoms and progress in treatment Patient's case to be discussed in multi-disciplinary team meeting Observation Level : q15 minute checks Vital signs: q12 hours Precautions: Safety   Long Term Goal(s): Improvement in symptoms so as ready for discharge   Short Term Goals: Ability to identify changes in lifestyle to reduce recurrence of condition will improve, Ability to disclose and discuss suicidal ideas, Ability to demonstrate self-control will improve, Ability to identify and develop effective coping behaviors will improve, Ability to maintain clinical measurements within normal limits will improve, Compliance with prescribed medications will improve, and Ability to identify triggers associated with substance abuse/mental health issues will improve   Diagnoses Principal Problem:   MDD (major depressive disorder), recurrent severe, without psychosis ([place]) Active Problems:   Alcohol use disorder   GERD (gastroesophageal reflux disease)   Attention deficit hyperactivity disorder (ADHD)   GAD (generalized anxiety disorder)   Medications: Start:  - Protonix 40 mg daily for GERD symptoms; home medication  Continue:  - Seroquel 25 mg twice daily for anxiety and mood, continue Seroquel extended release 100 mg at bedtime for mood and depression and also help with sleep. Patient continues to refuse starting antidepressants. - Nicotine patch for smoking cessation.  Other PRNS - Maalox 30 mg every 4 hrs PRN for indigestion - Imodium 2-4 mg as needed for diarrhea - Milk of Magnesia as needed every 6 hrs for constipation - Zofran disintegrating tabs  every 6 hrs PRN for nausea  - Atarax 50 mg every 6 hours as needed for anxiety and encourage patient to use. - Catapres as needed for high blood pressure.  Patient denies any history of hypertension, will follow.   Discharge Planning: Social work and case management to assist with discharge planning and identification of hospital follow-up needs prior to discharge Estimated LOS: 5-7 days Discharge Concerns: Need to establish a safety plan; Medication compliance and effectiveness Discharge Goals: Return home with outpatient referrals for mental health follow-up including medication management/psychotherapy   I certify that inpatient services furnished can reasonably be expected to improve the patient's condition.      Inda Merlin, NP 12/18/2022, 9:30 AM  Patient ID: Aaron Burke, male   DOB: 1986/06/17, 37 y.o.   MRN: [id number]

## 2022-12-18 NOTE — Group Note (Signed)
Date:  12/18/2022 Time:  10:47 PM  Group Topic/Focus:  Wrap-Up Group:   The focus of this group is to help patients review their daily goal of treatment and discuss progress on daily workbooks.    Participation Level:  Active  Participation Quality:  Appropriate  Affect:  Appropriate  Cognitive:  Appropriate  Insight: Appropriate  Engagement in Group:  Engaged  Modes of Intervention:  Education and Exploration  Additional Comments:  Patient attended and participated in group tonight.   Salley Scarlet [facility] 12/18/2022, 10:47 PM

## 2022-12-18 NOTE — BHH Group Notes (Signed)
Adult Psychoeducational Group Note Date:  12/18/2022 Time:  0900-1000 Group Topic/Focus: PROGRESSIVE RELAXATION. A group where deep breathing is taught and tensing and relaxation muscle groups is used. Imagery is used as well.  Pts are asked to imagine 3 pillars that hold them up when they are not able to hold themselves up and to share that with the group.   Participation Level:  Active  Participation Quality:  Appropriate  Affect:  Appropriate  Cognitive:  Approprate  Insight: Improving  Engagement in Group:  Engaged  Modes of Intervention:  deep breathing, Imagery. Discussion  Additional Comments:  Rates energy at a 10/10. States his mother, dad, uncle and his son hold him up.   : Aaron Burke

## 2022-12-18 NOTE — Progress Notes (Signed)
Patient in good spirits this morning, playful with other peers (but appropriate). Pt verbalized a plan when he discharges to go house hunting and get his items from the storage building his wife put things in. Patient reports he wants to work on things with his wife and is going to attempt to reconcile when he discharges.

## 2022-12-18 NOTE — Group Note (Signed)
Date:  12/18/2022 Time:  11:44 AM  Group Topic/Focus:  Orientation:   The focus of this group is to educate the patient on the purpose and policies of crisis stabilization and provide a format to answer questions about their admission.  The group details unit policies and expectations of patients while admitted.    Participation Level:  Active  Participation Quality:  Appropriate  Affect:  Appropriate  Cognitive:  Appropriate  Insight: Appropriate  Engagement in Group:  Engaged  Modes of Intervention:  Discussion  Additional Comments:     Jerrye Beavers 12/18/2022, 11:44 AM

## 2022-12-18 NOTE — Progress Notes (Signed)
During a medication pass earlier in the shift, patient revealed he understands he needed to be here when he was first admitted, and admits it has helped him "some." Patient believes he has reached a maximum amount treatment, though. MD aware.

## 2022-12-18 NOTE — Group Note (Unsigned)
Date:  12/18/2022 Time:  10:55 PM  Group Topic/Focus:  Wrap-Up Group:   The focus of this group is to help patients review their daily goal of treatment and discuss progress on daily workbooks.     Participation Level:  {BHH PARTICIPATION WO:[id number]  Participation Quality:  {BHH PARTICIPATION QUALITY:22265}  Affect:  {BHH AFFECT:22266}  Cognitive:  {BHH COGNITIVE:22267}  Insight: {BHH Insight2:20797}  Engagement in Group:  {BHH ENGAGEMENT IN BP:[id number]  Modes of Intervention:  {BHH MODES OF INTERVENTION:22269}  Additional Comments:  ***  Debe Coder 12/18/2022, 10:55 PM

## 2022-12-18 NOTE — Plan of Care (Signed)
  Problem: Education: Goal: Knowledge of [place] [facility] Education information/materials will improve Outcome: Progressing Goal: Emotional status will improve Outcome: Progressing Goal: Mental status will improve Outcome: Progressing Goal: Verbalization of understanding the information provided will improve Outcome: Progressing   Problem: Safety: Goal: Periods of time without injury will increase Outcome: Progressing   Problem: Coping: Goal: Coping ability will improve Outcome: Progressing Goal: Will verbalize feelings Outcome: Progressing   Problem: Health Behavior/Discharge Planning: Goal: Identification of resources available to assist in meeting health care needs will improve Outcome: Progressing   Problem: Medication: Goal: Compliance with prescribed medication regimen will improve Outcome: Progressing   Problem: Self-Concept: Goal: Ability to identify factors that promote anxiety will improve Outcome: Progressing Goal: Level of anxiety will decrease Outcome: Progressing Goal: Ability to modify response to factors that promote anxiety will improve Outcome: Progressing

## 2022-12-19 ENCOUNTER — Ambulatory Visit: Payer: 59 | Admitting: Psychiatry

## 2022-12-19 MED ORDER — QUETIAPINE FUMARATE 50 MG PO TABS
150.0000 mg | ORAL_TABLET | Freq: Every day | ORAL | Status: DC
  Administered 2022-12-19: 150 mg via ORAL
  Filled 2022-12-19 (×2): qty 1

## 2022-12-19 MED ORDER — VITAMIN D (ERGOCALCIFEROL) 1.25 MG (50000 UNIT) PO CAPS
50000.0000 [IU] | ORAL_CAPSULE | ORAL | Status: DC
  Administered 2022-12-19: 50000 [IU] via ORAL
  Filled 2022-12-19 (×2): qty 1

## 2022-12-19 NOTE — BHH Group Notes (Signed)
Adult Psychoeducational Group Note  Date:  12/19/2022 Time:  5:03 PM  Group Topic/Focus:  Social wellness  Participation Level:  Active  Participation Quality:  Appropriate  Affect:  Appropriate  Cognitive:  Appropriate  Insight: Appropriate  Engagement in Group:  Engaged  Modes of Intervention:  Role-play  Additional Comments:  Pt enjoyed  playing a game of charades with his peers. Pt has no feelings of wanting to hurt himself or others.  Caitland Porchia, Georgiann Mccoy 12/19/2022, 5:03 PM

## 2022-12-19 NOTE — Progress Notes (Signed)
   12/19/22 2300  Psych Admission Type (Psych Patients Only)  Admission Status Voluntary  Psychosocial Assessment  Patient Complaints Restlessness  Eye Contact Fair  Facial Expression Animated  Affect Appropriate to circumstance  Speech Logical/coherent  Interaction Assertive  Motor Activity Slow  Appearance/Hygiene In scrubs  Behavior Characteristics Cooperative;Appropriate to situation  Mood Pleasant  Thought Process  Coherency WDL  Content WDL  Delusions None reported or observed  Perception WDL  Hallucination None reported or observed  Judgment Limited  Confusion None  Danger to Self  Current suicidal ideation? Denies  Self-Injurious Behavior No self-injurious ideation or behavior indicators observed or expressed   Agreement Not to Harm Self Yes  Description of Agreement verbal  Danger to Others  Danger to Others None reported or observed  Danger to Others Abnormal  Harmful Behavior to others No threats or harm toward other people  Destructive Behavior No threats or harm toward property

## 2022-12-19 NOTE — BHH Counselor (Addendum)
CSW provided the Pt with a packet that contains information including shelter and housing resources, free and reduced price food information, clothing resources, crisis center information, a [place] card, and suicide prevention information.     CSW spoke with the Pt who stated that he would like a list of [place] and [place] in the [place] area.  CSW provided the Pt with these lists.  The Pt states that at discharge his wife will drop his truck off in the parking lot. He states that he then wants to get his items out of storage and then go to a hotel for 2 days.  He states that he will then go to an [place] or to [employer].  He states "I just want sometime to be alone".  CSW asked questions of the Pt to gage for safety.  The Pt states that he is not having suicidal thoughts and that "Ii am not going to hurt myself in any way after discharge".  CSW will inform the providers of the Pt's discharge plans.

## 2022-12-19 NOTE — Progress Notes (Signed)
D: Patient is alert, oriented, and cooperative. Denies SI, HI, AVH, and verbally contracts for safety. Patient mood is improved. He has been going to groups and meals.   A: Scheduled medications administered per MD order. Support provided. Patient educated on safety on the unit and medications. Routine safety checks every 15 minutes. Patient stated understanding to tell nurse about any new physical symptoms. Patient understands to tell staff of any needs.     R: No adverse drug reactions noted. Patient verbally contracts for safety. Patient remains safe at this time and will continue to monitor.    12/19/22 1100  Psych Admission Type (Psych Patients Only)  Admission Status Involuntary  Psychosocial Assessment  Patient Complaints Anxiety  Eye Contact Brief  Facial Expression Flat  Affect Depressed;Anxious  Speech Logical/coherent  Interaction Assertive  Motor Activity Other (Comment) (WDL)  Appearance/Hygiene Unremarkable  Behavior Characteristics Anxious  Mood Anxious  Thought Process  Coherency WDL  Content WDL  Delusions None reported or observed  Perception WDL  Hallucination None reported or observed  Judgment Limited  Confusion None  Danger to Self  Current suicidal ideation? Denies  Agreement Not to Harm Self Yes  Description of Agreement verbal  Danger to Others  Danger to Others None reported or observed  Danger to Others Abnormal  Harmful Behavior to others No threats or harm toward other people  Destructive Behavior No threats or harm toward property

## 2022-12-19 NOTE — Progress Notes (Signed)
[facility] MD Progress Note  12/19/2022 4:19 PM Aaron Burke  MRN:  [id number] Principal Problem: MDD (major depressive disorder), recurrent severe, without psychosis ([place]) Diagnosis: Principal Problem:   MDD (major depressive disorder), recurrent severe, without psychosis ([place]) Active Problems:   Alcohol use disorder   GERD (gastroesophageal reflux disease)   Attention deficit hyperactivity disorder (ADHD)   GAD (generalized anxiety disorder)  Reason For Admission: Aaron Burke is a 37 yo Caucasian male with a reported past mental health history of borderline personality disorder, GAD & ADHD who was taken to the [facility] ED on 02/18 by the [name] police dept and EMS after he made suicidal statements to his wife and child, and overdose on pills in the context of relationship stressors.  Patient was subsequently involuntarily committed and transferred to this behavioral health hospital for treatment and stabilization of his mental status.   24 hr chart review: Last V/S are from 2/25. Pt's assigned RN notified to complete V/S.  Patient is compliant with scheduled medications.  He required trazodone 50 mg last night for sleep, required hydroxyzine 50 mg earlier today morning for anxiety.  Slept for 5.75 hours last night per nursing documentation, no behavioral episodes noted in the past 24 hours.  Has attended some unit group sessions.  Patient assessment note: Objectively and subjectively, mood has significantly improved since hospitalization.  Mood today is euthymic, patient denies SI, denies HI, denies AVH, he denies paranoia, and there is no evidence of delusional thinking.  Patient reports that he has a job lined up with a [place], and would like to be discharged.  He reports a good appetite, denies being in any physical pain, denies medication related side effects, states his only problem and concern is trouble sleeping at night.  We are increasing Seroquel to 150 mg  nightly to help with sleep, and continuing other medications as listed below.  Writer called pt's mother Aaron Burke) after obtaining his verbal consent to ascertain if she thinks that he is ready for discharge.  Patient provided phone number for mother for writer to call [phone]).  Patient's mother states that she does not have any safety concerns regarding patient being discharged, but would like for CSW to contact her at above to discuss outpatient follow-up planning prior to patient being discharged.  Writer told mother that information will be relayed to CSW.  Tentative plan is to discharge patient tomorrow pending follow-up appointments being completed and safety planning being completed by CSW.  Total Time spent with patient: 45 minutes  Past Psychiatric History: Borderline personality d/o, ADHD  Past Medical History:  Past Medical History:  Diagnosis Date   Gallstone pancreatitis 2020   GERD (gastroesophageal reflux disease)    Plaque psoriasis    Psoriatic arthritis ([place])    Testicular torsion 2020    Past Surgical History:  Procedure Laterality Date   CHOLECYSTECTOMY     ESOPHAGOGASTRODUODENOSCOPY  2020   with dilatation   KNEE RECONSTRUCTION Left 08/30/2004   LEFT HEART CATH AND CORONARY ANGIOGRAPHY N/A 09/29/2020   Procedure: LEFT HEART CATH AND CORONARY ANGIOGRAPHY;  Surgeon: Martinique, Peter M, MD;  Location: [place] [facility];  Service: Cardiovascular;  Laterality: N/A;   Family History:  Family History  Problem Relation Age of Onset   Hyperlipidemia Mother    Heart attack Father        x 2, total 4 stents   Heart attack Paternal Grandfather    Bipolar disorder Other    Family Psychiatric  History: See H & P Social History:  Social History   Substance and Sexual Activity  Alcohol Use Never     Social History   Substance and Sexual Activity  Drug Use Never    Social History   Socioeconomic History   Marital status: Married    Spouse name: Not on file    Number of children: Not on file   Years of education: Not on file   Highest education level: Not on file  Occupational History   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   Social Determinants of Health   Financial Resource Strain: Not on file  Food Insecurity: No Food Insecurity (12/13/2022)   Hunger Vital Sign    Worried About Running Out of Food in the Last Year: Never true    Ran Out of Food in the Last Year: Never true  Transportation Needs: Unknown (12/13/2022)   [place] - Hydrologist (Medical): Patient refused    Lack of Transportation (Non-Medical): Patient refused  Physical Activity: Not on file  Stress: Not on file  Social Connections: Not on file   Sleep: Good  Appetite:  Poor  Current Medications: Current Facility-Administered Medications  Medication Dose Route Frequency Provider Last Rate Last Admin   alum & mag hydroxide-simeth (MAALOX/MYLANTA) 200-200-20 MG/5ML suspension 30 mL  30 mL Oral Q4H PRN Charmaine Downs C, NP   30 mL at 12/17/22 0544   Apremilast TABS 30 mg  1 tablet Oral BID Ajibola, Ene A, NP   30 mg at 12/18/22 2104   cloNIDine (CATAPRES) tablet 0.1 mg  0.1 mg Oral BID PRN Nicholes Rough, NP   0.1 mg at 12/18/22 0631   hydrOXYzine (ATARAX) tablet 50 mg  50 mg Oral Q6H PRN Nicholes Rough, NP   50 mg at 12/19/22 [id number]   magnesium hydroxide (MILK OF MAGNESIA) suspension 30 mL  30 mL Oral Daily PRN Delfin Gant, NP       multivitamin with minerals tablet 1 tablet  1 tablet Oral Daily Nicholes Rough, NP   1 tablet at 12/19/22 [id number]   nicotine (NICODERM  CQ - dosed in mg/24 hours) patch 14 mg  14 mg Transdermal Daily Attiah, Nadir, MD   14 mg at 12/18/22 0813   nicotine polacrilex (NICORETTE) gum 2 mg  2 mg Oral PRN Winfred Leeds, Nadir, MD   2 mg at 12/19/22 1602   OLANZapine zydis (ZYPREXA) disintegrating tablet 5 mg  5 mg Oral Q8H PRN Nicholes Rough, NP   5 mg at 12/17/22 1427   And   ziprasidone (GEODON) injection 20 mg  20 mg Intramuscular PRN Nicholes Rough, NP       ondansetron (ZOFRAN-ODT) disintegrating tablet 8 mg  8 mg Oral Q8H PRN Winfred Leeds, Nadir, MD   8 mg at 12/18/22 0213   pantoprazole (PROTONIX) EC tablet 40 mg  40 mg Oral Daily Leevy-Johnson, Brooke A, NP   40 mg at 12/19/22 0826   QUEtiapine (SEROQUEL) tablet 150 mg  150 mg Oral QHS Aiko Belko,  Oswaldo Cueto, NP       QUEtiapine (SEROQUEL) tablet 25 mg  25 mg Oral BID Nicholes Rough, NP   25 mg at 12/19/22 1443   thiamine (Vitamin B-1) tablet 100 mg  100 mg Oral Daily Nicholes Rough, NP   100 mg at 12/19/22 [id number]   thiamine (VITAMIN B1) injection 100 mg  100 mg Intramuscular Once Nicholes Rough, NP       traZODone (DESYREL) tablet 50 mg  50 mg Oral QHS PRN Ajibola, Ene A, NP   50 mg at 12/18/22 2102   Vitamin D (Ergocalciferol) (DRISDOL) 1.25 MG (50000 UNIT) capsule 50,000 Units  50,000 Units Oral Weekly Nicholes Rough, NP        Lab Results:  Results for orders placed or performed during the hospital encounter of 12/12/22 (from the past 48 hour(s))  Hepatic function panel     Status: Abnormal   Collection Time: 12/18/22  6:15 PM  Result Value Ref Range   Total Protein 7.3 6.5 - 8.1 g/dL   Albumin 4.0 3.5 - 5.0 g/dL   AST 42 (H) 15 - 41 U/L   ALT 78 (H) 0 - 44 U/L   Alkaline Phosphatase 78 38 - 126 U/L   Total Bilirubin 0.3 0.3 - 1.2 mg/dL   Bilirubin, Direct <0.1 0.0 - 0.2 mg/dL   Indirect Bilirubin NOT CALCULATED 0.3 - 0.9 mg/dL    Comment: Performed at [facility], [place] [address], [place], [place] [address]    Blood Alcohol level:  Lab Results  Component Value  Date   ETH 275 (H) 123456    Metabolic Disorder Labs: Lab Results  Component Value Date   HGBA1C 6.1 (H) 12/14/2022   MPG 128.37 12/14/2022   MPG 128.37 09/27/2020   No results found for: "PROLACTIN" Lab Results  Component Value Date   CHOL 292 (H) 12/14/2022   TRIG 98 12/14/2022   HDL 77 12/14/2022   CHOLHDL 3.8 12/14/2022   VLDL 20 12/14/2022   LDLCALC 195 (H) 12/14/2022   LDLCALC 222 (H) 10/30/2020    Physical Findings: AIMS:  , ,  ,  ,    CIWA:  CIWA-Ar Total: 2 COWS:     Musculoskeletal: Strength & Muscle Tone: within normal limits Gait & Station: normal Patient leans: N/A  Psychiatric Specialty Exam:  Presentation  General Appearance:  Appropriate for Environment; Fairly Groomed  Eye Contact: Good  Speech: Clear and Coherent  Speech Volume: Normal  Handedness: Right   Mood and Affect  Mood: Euthymic  Affect: Congruent   Thought Process  Thought Processes: Coherent  Descriptions of Associations:Intact  Orientation:Full (Time, Place and Person)  Thought Content:Tangential  History of Schizophrenia/Schizoaffective disorder:No data recorded Duration of Psychotic Symptoms:No data recorded Hallucinations:Hallucinations: None  Ideas of Reference:None  Suicidal Thoughts:Suicidal Thoughts: No  Homicidal Thoughts:Homicidal Thoughts: No   Sensorium  Memory: Immediate Good  Judgment: Good  Insight: Good   Executive Functions  Concentration: Good  Attention Span: Good  Recall: Good  Fund of Knowledge: Good  Language: Good   Psychomotor Activity  Psychomotor Activity: Psychomotor Activity: Normal   Assets  Assets: Communication Skills   Sleep  Sleep: Sleep: Fair    Physical Exam: Physical Exam HENT:     Mouth/Throat:     Mouth: Mucous membranes are moist.  Pulmonary:     Effort: Pulmonary effort is normal.  Musculoskeletal:     Left lower leg: No edema.  Neurological:     Mental Status:  He  is alert and oriented to person, place, and time.  Psychiatric:        Thought Content: Thought content normal.    Review of Systems  Constitutional: Negative.  Negative for fever.  HENT: Negative.    Eyes: Negative.  Negative for blurred vision.  Respiratory: Negative.    Cardiovascular: Negative.   Gastrointestinal: Negative.  Negative for heartburn.  Genitourinary: Negative.   Musculoskeletal: Negative.   Skin: Negative.   Neurological: Negative.   Psychiatric/Behavioral:  Positive for depression and substance abuse. Negative for hallucinations, memory loss and suicidal ideas. The patient is nervous/anxious and has insomnia.    Blood pressure (!) 142/89, pulse 90, temperature 97.7 F (36.5 C), temperature source Oral, resp. rate 16, height '5\' 9"'$  (1.753 m), weight 80 kg, SpO2 97 %. Body mass index is 26.05 kg/m.  Treatment Plan Summary: Daily contact with patient to assess and evaluate symptoms and progress in treatment and Medication management   Observation Level/Precautions:  15 minute checks  Laboratory:  Labs reviewed   Psychotherapy:  Unit Group sessions  Medications:  See [facility]  Consultations:  To be determined   Discharge Concerns:  Safety, medication compliance, mood stability  Estimated LOS: 5-7 days  Other:  N/A    Labs reviewed on 12/19/2022: Hemoglobin A1c is 6.1, rendering patient prediabetic, will need follow-up with PCP.  LFTs also elevated, and in need of PCP follow-up after discharge.  Vitamin D low at 12.49, supplementing with 50,000 units weekly.  Cholesterol 292, LDL 195.  Will also need PCP follow-up.   PLAN Safety and Monitoring: Voluntary admission to inpatient psychiatric unit for safety, stabilization and treatment Daily contact with patient to assess and evaluate symptoms and progress in treatment Patient's case to be discussed in multi-disciplinary team meeting Observation Level : q15 minute checks Vital signs: q12 hours Precautions: Safety    Long Term Goal(s): Improvement in symptoms so as ready for discharge   Short Term Goals: Ability to identify changes in lifestyle to reduce recurrence of condition will improve, Ability to disclose and discuss suicidal ideas, Ability to demonstrate self-control will improve, Ability to identify and develop effective coping behaviors will improve, Ability to maintain clinical measurements within normal limits will improve, Compliance with prescribed medications will improve, and Ability to identify triggers associated with substance abuse/mental health issues will improve   Diagnoses Principal Problem:   MDD (major depressive disorder), recurrent severe, without psychosis ([place]) Active Problems:   Alcohol use disorder   GERD (gastroesophageal reflux disease)   Attention deficit hyperactivity disorder (ADHD)   GAD (generalized anxiety disorder)   Medications: -Change night time Seroquel to 100 mg IR nightly for mood stabilization and increase to 150 mg  -Start Vitamin D 50.000 units weekly for Vitamin D deficiency -Continue Seroquel 25 mg BID (0800 & 1400 for mood stabilization) -Continue Ativan detox protocol for EtOH abuse -Continue to monitor withdrawals via CIWA scores -Continue hydroxyzine 25 mg 3 times daily as needed for anxiety -Continue clonidine 0.1 mg twice daily as needed for SBP over 160 or DBP over 100 -Discontinue Cymbalta 30 mg daily for depressive symptoms (home medication)-states has not taken for over 2 months Patient educated on rationales, benefits, possible side effects of all medications as listed above and verbalizes understanding of all.   Other PRNS -Discontinue Naproxen 500 mg BID for pain x 6 doses, then change to PRN-States does not want it -Continue Maalox 30 mg every 4 hrs PRN for indigestion -Continue Imodium 2-4 mg as needed for  diarrhea -Continue Milk of Magnesia as needed every 6 hrs for constipation -Continue Zofran disintegrating tabs every 6 hrs PRN  for nausea    Discharge Planning: Social work and case management to assist with discharge planning and identification of hospital follow-up needs prior to discharge Estimated LOS: 5-7 days Discharge Concerns: Need to establish a safety plan; Medication compliance and effectiveness Discharge Goals: Return home with outpatient referrals for mental health follow-up including medication management/psychotherapy   I certify that inpatient services furnished can reasonably be expected to improve the patient's condition.     Nicholes Rough, NP 12/19/2022, 4:19 PMPatient ID: Aaron Burke, male   DOB: Jun 17, 1986, 37 y.o.   MRN: [id number] Patient ID: Aaron Burke, male   DOB: Mar 31, 1986, 37 y.o.   MRN: [id number]

## 2022-12-19 NOTE — Group Note (Signed)
Recreation Therapy Group Note   Group Topic:Stress Management  Group Date: 12/19/2022 Start Time: 0930 End Time: 0950 Facilitators: Gal Smolinski-McCall, LRT,CTRS Location: [address] Dayroom   Goal Area(s) Addresses:  Patient will identify positive stress management techniques. Patient will identify benefits of using stress management post d/c.  Group Description:  Meditation.  LRT and patients discussed meditation and what it in tales.  LRT then explained to patients the group setting.  LRT played a meditation that focused on starting the day motivated and confident.  Patients were to listen and follow along as meditation played to fully engage in the process.   Affect/Mood: N/A   Participation Level: Did not attend    Clinical Observations/Individualized Feedback:     Plan: Continue to engage patient in RT group sessions 2-3x/week.   Akesha Uresti-McCall, LRT,CTRS 12/19/2022 1:18 PM

## 2022-12-19 NOTE — [facility] Group Notes (Signed)
Spiritual care group on grief and loss facilitated by Chaplain Janne Napoleon, Bcc and Lysle Morales, counseling intern.  Group Goal: Support / Education around grief and loss  Members engage in facilitated group support and psycho-social education.  Group Description:  Following introductions and group rules, group members engaged in facilitated group dialogue and support around topic of loss, with particular support around experiences of loss in their lives. Group Identified types of loss (relationships / self / things) and identified patterns, circumstances, and changes that precipitate losses. Reflected on thoughts / feelings around loss, normalized grief responses, and recognized variety in grief experience. Group encouraged individual reflection on safe space and on the coping skills that they are already utilizing.  Group drew on Adlerian / Rogerian and narrative framework  Patient Progress: Aaron Burke attended the end of group and was engaged for the time that he was present in group.  He participated readily in conversation and activities.  [address], [place] Pager, [phone]

## 2022-12-19 NOTE — Group Note (Signed)
Occupational Therapy Group Note  Group Topic: Sleep Hygiene  Group Date: 12/19/2022 Start Time: 1430 End Time: 1510 Facilitators: Brantley Stage, OT   Group Description: Group encouraged increased participation and engagement through topic focused on sleep hygiene. Patients reflected on the quality of sleep they typically receive and identified areas that need improvement. Group was given background information on sleep and sleep hygiene, including common sleep disorders. Group members also received information on how to improve one's sleep and introduced a sleep diary as a tool that can be utilized to track sleep quality over a length of time. Group session ended with patients identifying one or more strategies they could utilize or implement into their sleep routine in order to improve overall sleep quality.        Therapeutic Goal(s):  Identify one or more strategies to improve overall sleep hygiene  Identify one or more areas of sleep that are negatively impacted (sleep too much, too little, etc)     Participation Level: Active   Participation Quality: Independent   Behavior: Appropriate   Speech/Thought Process: Relevant   Affect/Mood: Appropriate   Insight: Fair   Judgement: Fair   Individualization: pt was active in their participation of group discussion/activity. New skills identified  Modes of Intervention: Education  Patient Response to Interventions:  Attentive   Plan: Continue to engage patient in OT groups 2 - 3x/week.  12/19/2022  Brantley Stage, OT  [name], OT

## 2022-12-19 NOTE — Progress Notes (Signed)
   12/19/22 0548  15 Minute Checks  Location Bedroom  Visual Appearance Calm  Behavior Sleeping  Sleep (Behavioral Health Patients Only)  Calculate sleep? (Click Yes once per 24 hr at 0600 safety check) Yes  Documented sleep last 24 hours 5.75

## 2022-12-19 NOTE — [facility] Group Notes (Signed)
Pt attended A/A

## 2022-12-20 LAB — VITAMIN B1: Vitamin B1 (Thiamine): 154.9 nmol/L (ref 66.5–200.0)

## 2022-12-20 MED ORDER — HYDROXYZINE HCL 50 MG PO TABS: 50.0000 mg | ORAL_TABLET | Freq: Four times a day (QID) | ORAL | 0 refills | Status: DC | PRN

## 2022-12-20 MED ORDER — TRAZODONE HCL 50 MG PO TABS: 50.0000 mg | ORAL_TABLET | Freq: Every evening | ORAL | 0 refills | Status: DC | PRN

## 2022-12-20 MED ORDER — QUETIAPINE FUMARATE 25 MG PO TABS: 25.0000 mg | ORAL_TABLET | Freq: Two times a day (BID) | ORAL | 0 refills | Status: DC

## 2022-12-20 MED ORDER — VITAMIN D (ERGOCALCIFEROL) 1.25 MG (50000 UNIT) PO CAPS: 50000.0000 [IU] | ORAL_CAPSULE | ORAL | 0 refills | Status: DC

## 2022-12-20 MED ORDER — PANTOPRAZOLE SODIUM 40 MG PO TBEC: 40.0000 mg | DELAYED_RELEASE_TABLET | Freq: Every day | ORAL | 0 refills | Status: DC

## 2022-12-20 MED ORDER — OTEZLA 30 MG PO TABS: 1.0000 | ORAL_TABLET | Freq: Two times a day (BID) | ORAL | 0 refills | Status: DC

## 2022-12-20 MED ORDER — NICOTINE 14 MG/24HR TD PT24: 14.0000 mg | MEDICATED_PATCH | Freq: Every day | TRANSDERMAL | 0 refills | Status: DC

## 2022-12-20 MED ORDER — QUETIAPINE FUMARATE 150 MG PO TABS: 150.0000 mg | ORAL_TABLET | Freq: Every day | ORAL | 0 refills | Status: DC

## 2022-12-20 NOTE — Discharge Summary (Signed)
Physician Discharge Summary Note  Patient:  Aaron Burke is an 37 y.o., male MRN:  [id number] DOB:  [birth year] Patient phone:  [phone] (home)  Patient address:   [place] [address],  Total Time spent with patient: 45 minutes  Date of Admission:  12/12/2022 Date of Discharge: 12/20/2022  Reason for Admission:  Reason For Admission: Aaron Burke is a 37 yo Caucasian male with a reported past mental health history of borderline personality disorder, GAD & ADHD who was taken to the [facility] ED on 02/18 by the [facility] police dept and EMS after he made suicidal statements to his wife and child, and overdose on pills in the context of relationship stressors.  Patient was subsequently involuntarily committed and transferred to this behavioral health hospital for treatment and stabilization of his mental status.     Principal Problem: MDD (major depressive disorder), recurrent severe, without psychosis ([place]) Discharge Diagnoses: Principal Problem:   MDD (major depressive disorder), recurrent severe, without psychosis ([place]) Active Problems:   Alcohol use disorder   GERD (gastroesophageal reflux disease)   Attention deficit hyperactivity disorder (ADHD)   GAD (generalized anxiety disorder)  Past Psychiatric History: See below  Past Medical History:  Past Medical History:  Diagnosis Date   Gallstone pancreatitis 2020   GERD (gastroesophageal reflux disease)    Plaque psoriasis    Psoriatic arthritis ([place])    Testicular torsion 2020    Past Surgical History:  Procedure Laterality Date   CHOLECYSTECTOMY     ESOPHAGOGASTRODUODENOSCOPY  2020   with dilatation   KNEE RECONSTRUCTION Left 08/30/2004   LEFT HEART CATH AND CORONARY ANGIOGRAPHY N/A 09/29/2020   Procedure: LEFT HEART CATH AND CORONARY ANGIOGRAPHY;  Surgeon: Martinique, Peter M, MD;  Location: [facility];  Service: Cardiovascular;  Laterality: N/A;   Family History:  Family History   Problem Relation Age of Onset   Hyperlipidemia Mother    Heart attack Father        x 2, total 4 stents   Heart attack Paternal Grandfather    Bipolar disorder Other    Family Psychiatric  History: See H & P Social History:  Social History   Substance and Sexual Activity  Alcohol Use Never     Social History   Substance and Sexual Activity  Drug Use Never    Social History   Socioeconomic History   Marital status: Married    Spouse name: Not on file   Number of children: Not on file   Years of education: Not on file   Highest education level: Not on file  Occupational History   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   Social Determinants of Health   Financial Resource Strain: Not on file  Food Insecurity: No Food Insecurity (12/13/2022)   Hunger Vital Sign    Worried About Running Out of Food in the Last Year: Never true    Ran Out of Food in the Last Year: Never true  Transportation Needs: Unknown (12/13/2022)   PRAPARE - Transportation  Lack of Transportation (Medical): Patient refused    Lack of Transportation (Non-Medical): Patient refused  Physical Activity: Not on file  Stress: Not on file  Social Connections: Not on file                         [place]  During the patient's hospitalization, patient had extensive initial psychiatric evaluation, and follow-up psychiatric evaluations every day. Psychiatric diagnoses provided upon initial assessment are as listed above.  Patient's psychiatric medications were adjusted on admission as follows:  -Started Seroquel to 50 mg nightly  for mood stabilization -Started Ativan detox protocol for EtOH abuse -Continue to monitor withdrawals via CIWA scores -Started hydroxyzine 25 mg 3 times daily as needed for anxiety -Started clonidine 0.1 mg twice daily as needed for SBP over 160 or DBP over 100 -Started Cymbalta 30 mg daily for depressive symptoms (home medication)   During the hospitalization, other adjustments were made to the patient's psychiatric medication regimen. Cymbalta was discontinued as pt stated that he had been taking med for >2 months and it was not effective. He showed improvements in mood on the Seroquel. Medications at discharge are as follows: -Continue Vitamin D 50.000 units weekly for Vitamin D deficiency -Continue Seroquel 25 mg BID (0800 & 1400 for mood stabilization) -Continue hydroxyzine 25 mg 3 times daily as needed for anxiety -Continue Seroquel 150 mg IR nightly for mood stabilization  -Continue Protonix 40 mg daily for GERD -Continue Apremilast 30 mg BID psoriatic arthritis  -Continue Niocotine patch for nicotine dependence    Patient's care was discussed during the interdisciplinary team meeting every day during the hospitalization. The patient denies having side effects to prescribed psychiatric medication. Gradually, patient started adjusting to milieu. The patient was evaluated each day by a clinical provider to ascertain response to treatment. Improvement was noted by the patient's report of decreasing symptoms, improved sleep and appetite, affect, medication tolerance, behavior, and participation in unit programming.  Patient was asked each day to complete a self inventory noting mood, mental status, pain, new symptoms, anxiety and concerns.     Symptoms were reported as significantly decreased or resolved completely by discharge. On day of discharge, the patient reports that their mood is stable. The patient denied having suicidal thoughts for more than 48 hours prior to discharge.  Patient  denies having homicidal thoughts.  Patient denies having auditory hallucinations.  Patient denies any visual hallucinations or other symptoms of psychosis. The patient was motivated to continue taking medication with a goal of continued improvement in mental health.    The patient reports their target psychiatric symptoms of depression, anxiety, insomnia & irritable mood responded well to the psychiatric medications, and the patient reports overall benefit from this psychiatric hospitalization. Supportive psychotherapy was provided to the patient. The patient also participated in regular group therapy while hospitalized. Coping skills, problem solving as well as relaxation therapies were also part of the unit programming.   Labs were reviewed with the patient, and abnormal results were discussed with the patient. LFTs elevated, pt educated to follow up with his PCP regarding this. Cholesterol is 292, pt educated on healthy food choices & exercise and to f/u with his PCP as well.   The patient is able to verbalize their individual safety plan to this provider.   # It is recommended to the patient to continue psychiatric medications as prescribed, after discharge from the hospital.     # It is recommended to  the patient to follow up with your outpatient psychiatric provider and PCP.   # It was discussed with the patient, the impact of alcohol, drugs, tobacco have been there overall psychiatric and medical wellbeing, and total abstinence from substance use was recommended the patient.ed.   # Prescriptions provided or sent directly to preferred pharmacy at discharge. Patient agreeable to plan. Given opportunity to ask questions. Appears to feel comfortable with discharge.    # In the event of worsening symptoms, the patient is instructed to call the crisis hotline (988), 911 and or go to the nearest ED for appropriate evaluation and treatment of symptoms. To follow-up with primary care provider for other  medical issues, concerns and or health care needs   # Patient was discharged home with a plan to follow up as noted below.   Physical Findings: AIMS: Facial and Oral Movements Muscles of Facial Expression: None, normal Lips and Perioral Area: None, normal Jaw: None, normal Tongue: None, normal,Extremity Movements Upper (arms, wrists, hands, fingers): None, normal Lower (legs, knees, ankles, toes): None, normal, Trunk Movements Neck, shoulders, hips: None, normal, Overall Severity Severity of abnormal movements (highest score from questions above): None, normal Incapacitation due to abnormal movements: None, normal Patient's awareness of abnormal movements (rate only patient's report): No Awareness, Dental Status Current problems with teeth and/or dentures?: No Does patient usually wear dentures?: No  CIWA:  CIWA-Ar Total: 2 COWS: na    Musculoskeletal: Strength & Muscle Tone: within normal limits Gait & Station: normal Patient leans: N/A  Psychiatric Specialty Exam:  Presentation  General Appearance:  Appropriate for Environment  Eye Contact: Good  Speech: Clear and Coherent  Speech Volume: Normal  Handedness: Right   Mood and Affect  Mood: Euthymic  Affect: Congruent   Thought Process  Thought Processes: Coherent  Descriptions of Associations:Intact  Orientation:Full (Time, Place and Person)  Thought Content:Logical  History of Schizophrenia/Schizoaffective disorder:No data recorded Duration of Psychotic Symptoms:No data recorded Hallucinations:Hallucinations: None  Ideas of Reference:None  Suicidal Thoughts:Suicidal Thoughts: No  Homicidal Thoughts:Homicidal Thoughts: No   Sensorium  Memory: Immediate Good  Judgment: Good  Insight: Good   Executive Functions  Concentration: Good  Attention Span: Good  Recall: Good  Fund of Knowledge: Good  Language: Good   Psychomotor Activity  Psychomotor Activity: Psychomotor  Activity: Normal   Assets  Assets: Communication Skills   Sleep  Sleep: Sleep: Good    Physical Exam: Physical Exam Constitutional:      Appearance: Normal appearance.  HENT:     Nose: Nose normal.  Musculoskeletal:        General: Normal range of motion.  Neurological:     Mental Status: He is alert and oriented to person, place, and time.  Psychiatric:        Thought Content: Thought content normal.    Review of Systems  Constitutional:  Negative for fever.  HENT:  Negative for hearing loss.   Eyes:  Negative for blurred vision.  Cardiovascular: Negative.  Negative for chest pain.  Gastrointestinal:  Negative for heartburn.  Genitourinary:  Negative for dysuria.  Musculoskeletal: Negative.   Skin: Negative.   Psychiatric/Behavioral:  Positive for depression (Denied SI/HI/AVH, contracted for safety outside of [facility]). Negative for hallucinations, memory loss, substance abuse and suicidal ideas. The patient is nervous/anxious (resolving at time of discharge) and has insomnia (resolving at time of discharge).    Blood pressure (!) 137/99, pulse 87, temperature 97.8 F (36.6 C), temperature source Oral, resp. rate 16, height  $'5\' 9"'Y$  (1.753 m), weight 80 kg, SpO2 100 %. Body mass index is 26.05 kg/m.   Social History   Tobacco Use  Smoking Status Former   Years: 20.00   Types: Cigarettes  Smokeless Tobacco Never   Tobacco Cessation:  A prescription for an FDA-approved tobacco cessation medication provided at discharge   Blood Alcohol level:  Lab Results  Component Value Date   ETH 275 (H) 123456    Metabolic Disorder Labs:  Lab Results  Component Value Date   HGBA1C 6.1 (H) 12/14/2022   MPG 128.37 12/14/2022   MPG 128.37 09/27/2020   No results found for: "PROLACTIN" Lab Results  Component Value Date   CHOL 292 (H) 12/14/2022   TRIG 98 12/14/2022   HDL 77 12/14/2022   CHOLHDL 3.8 12/14/2022   VLDL 20 12/14/2022   LDLCALC 195 (H) 12/14/2022    LDLCALC 222 (H) 10/30/2020    See Psychiatric Specialty Exam and Suicide Risk Assessment completed by Attending Physician prior to discharge.  Discharge destination:  Home  Is patient on multiple antipsychotic therapies at discharge:  No   Has Patient had three or more failed trials of antipsychotic monotherapy by history:  No  Recommended Plan for Multiple Antipsychotic Therapies: NA   Allergies as of 12/20/2022       Reactions   Percocet [oxycodone-acetaminophen] Other (See Comments)   "makes me feel uncomfortable"        Medication List     STOP taking these medications    amphetamine-dextroamphetamine 10 MG tablet Commonly known as: ADDERALL   amphetamine-dextroamphetamine 30 MG tablet Commonly known as: ADDERALL   DULoxetine 30 MG capsule Commonly known as: CYMBALTA   LORazepam 1 MG tablet Commonly known as: ATIVAN   risperiDONE 1 MG tablet Commonly known as: RISPERDAL       TAKE these medications      Indication  hydrOXYzine 50 MG tablet Commonly known as: ATARAX Take 1 tablet (50 mg total) by mouth every 6 (six) hours as needed for anxiety.  Indication: Feeling Anxious   nicotine 14 mg/24hr patch Commonly known as: NICODERM CQ - dosed in mg/24 hours Place 1 patch (14 mg total) onto the skin daily.  Indication: Nicotine Addiction   Otezla 30 MG Tabs Generic drug: Apremilast Take 1 tablet (30 mg total) by mouth 2 (two) times daily. What changed: how much to take  Indication: Psoriasis associated with Arthritis   pantoprazole 40 MG tablet Commonly known as: PROTONIX Take 1 tablet (40 mg total) by mouth daily.  Indication: Gastroesophageal Reflux Disease   QUEtiapine 25 MG tablet Commonly known as: SEROQUEL Take 1 tablet (25 mg total) by mouth 2 (two) times daily. What changed: when to take this  Indication: Major Depressive Disorder   QUEtiapine Fumarate 150 MG Tabs Take 150 mg by mouth at bedtime. What changed: You were already taking  a medication with the same name, and this prescription was added. Make sure you understand how and when to take each.  Indication: Major Depressive Disorder   traZODone 50 MG tablet Commonly known as: DESYREL Take 1 tablet (50 mg total) by mouth at bedtime as needed for sleep.  Indication: Trouble Sleeping   Vitamin D (Ergocalciferol) 1.25 MG (50000 UNIT) Caps capsule Commonly known as: DRISDOL Take 1 capsule (50,000 Units total) by mouth once a week. Start taking on: December 26, 2022  Indication: Vitamin D Deficiency        Follow-up Information     [facility].  Go on 12/23/2022.   Specialty: Behavioral Health Why: You have an appointment for medication management services on 12/23/22 at 10:30 am.  You also have an appointment for therapy services on 01/02/23 at 12:00 pm. Contact information: [address] [place] [address] [phone]                Signed: Nicholes Rough, NP 12/21/2022, 12:46 PM

## 2022-12-20 NOTE — Progress Notes (Signed)
D: Patient verbalizes readiness for discharge, denies suicidal and homicidal ideations, denies auditory and visual hallucinations.  No complaints of pain. Suicide Safety Plan completed and copy placed in the chart.  A:  Patient receptive to discharge instructions. Questions encouraged, both verbalize understanding.  R:  Escorted to the lobby by [facility].

## 2022-12-20 NOTE — B[facility] Counselor (Signed)
At the NP's request, CSW attempted to contact Aaron Burke [phone] (Mother) about the Pt's discharge.  There was no answer.  CSW left a voicemail asking for a return call prior to 11:00am.  The patient (her son) is scheduled to discharge at 11:00am today to his own truck.  The Pt has stated that he will be using his own truck to go to a hotel of his choosing after discharge.  CSW will provide information to the mother if contacted prior to the Pt's discharge.

## 2022-12-20 NOTE — Progress Notes (Signed)
   12/20/22 0900  Psych Admission Type (Psych Patients Only)  Admission Status Voluntary  Psychosocial Assessment  Patient Complaints None  Eye Contact Fair  Facial Expression Animated  Affect Appropriate to circumstance  Speech Logical/coherent  Interaction Assertive  Motor Activity Other (Comment)  Appearance/Hygiene Unremarkable  Behavior Characteristics Cooperative;Appropriate to situation  Mood Pleasant  Thought Process  Coherency WDL  Content WDL  Delusions None reported or observed  Perception WDL  Hallucination None reported or observed  Judgment Impaired  Confusion None  Danger to Self  Current suicidal ideation? Denies  Self-Injurious Behavior No self-injurious ideation or behavior indicators observed or expressed   Agreement Not to Harm Self Yes  Description of Agreement Verbal  Danger to Others  Danger to Others None reported or observed

## 2022-12-20 NOTE — Progress Notes (Signed)
  [facility] Adult Case Management Discharge Plan :  Will you be returning to the same living situation after discharge:  No., Hotel  At discharge, do you have transportation home?: Yes,  Own truck  Do you have the ability to pay for your medications: Yes,  [employer] of information consent forms completed and in the chart;  Patient's signature needed at discharge.  Patient to Follow up at:  Follow-up Information     [facility]. Go on 12/23/2022.   Specialty: Behavioral Health Why: You have an appointment for medication management services on 12/23/22 at 10:30 am.  You also have an appointment for therapy services on 01/02/23 at 12:00 pm. Contact information: [address] [place] [address] [phone]                 Next level of care provider has access to [facility] and Suicide Prevention discussed: Yes,  With patient and wife      Has patient been referred to the Quitline?: N/A patient is not a smoker  Patient has been referred for addiction treatment: [place], [place] 12/20/2022, 9:32 AM

## 2022-12-20 NOTE — BHH Suicide Risk Assessment (Signed)
Suicide Risk Assessment  Discharge Assessment    [facility] Discharge Suicide Risk Assessment   Principal Problem: MDD (major depressive disorder), recurrent severe, without psychosis ([place]) Discharge Diagnoses: Principal Problem:   MDD (major depressive disorder), recurrent severe, without psychosis ([place]) Active Problems:   Alcohol use disorder   GERD (gastroesophageal reflux disease)   Attention deficit hyperactivity disorder (ADHD)   GAD (generalized anxiety disorder)  Reason For Admission: Aaron Burke is a 37 yo Caucasian male with a reported past mental health history of borderline personality disorder, GAD & ADHD who was taken to the [facility] ED on 02/18 by the [facility] police dept and EMS after he made suicidal statements to his wife and child, and overdose on pills in the context of relationship stressors.  Patient was subsequently involuntarily committed and transferred to this behavioral health hospital for treatment and stabilization of his mental status.                                  HOSPITAL COURSE  During the patient's hospitalization, patient had extensive initial psychiatric evaluation, and follow-up psychiatric evaluations every day. Psychiatric diagnoses provided upon initial assessment are as listed above.  Patient's psychiatric medications were adjusted on admission as follows:  -Started Seroquel to 50 mg nightly for mood stabilization -Started Ativan detox protocol for EtOH abuse -Continue to monitor withdrawals via CIWA scores -Started hydroxyzine 25 mg 3 times daily as needed for anxiety -Started clonidine 0.1 mg twice daily as needed for SBP over 160 or DBP over 100 -Started Cymbalta 30 mg daily for depressive symptoms (home medication)  During the hospitalization, other adjustments were made to the patient's psychiatric medication regimen. Cymbalta was discontinued as pt stated that he had been taking med for >2 months and it was not effective. He showed  improvements in mood on the Seroquel. Medications at discharge are as follows: -Continue Vitamin D 50.000 units weekly for Vitamin D deficiency -Continue Seroquel 25 mg BID (0800 & 1400 for mood stabilization) -Continue hydroxyzine 25 mg 3 times daily as needed for anxiety -Continue Seroquel 150 mg IR nightly for mood stabilization  -Continue Protonix 40 mg daily for GERD -Continue Apremilast 30 mg BID psoriatic arthritis  -Continue Niocotine patch for nicotine dependence   Patient's care was discussed during the interdisciplinary team meeting every day during the hospitalization. The patient denies having side effects to prescribed psychiatric medication. Gradually, patient started adjusting to milieu. The patient was evaluated each day by a clinical provider to ascertain response to treatment. Improvement was noted by the patient's report of decreasing symptoms, improved sleep and appetite, affect, medication tolerance, behavior, and participation in unit programming.  Patient was asked each day to complete a self inventory noting mood, mental status, pain, new symptoms, anxiety and concerns.    Symptoms were reported as significantly decreased or resolved completely by discharge. On day of discharge, the patient reports that their mood is stable. The patient denied having suicidal thoughts for more than 48 hours prior to discharge.  Patient denies having homicidal thoughts.  Patient denies having auditory hallucinations.  Patient denies any visual hallucinations or other symptoms of psychosis. The patient was motivated to continue taking medication with a goal of continued improvement in mental health.   The patient reports their target psychiatric symptoms of depression, anxiety, insomnia & irritable mood responded well to the psychiatric medications, and the patient reports overall benefit from this psychiatric hospitalization.  Supportive psychotherapy was provided to the patient. The patient also  participated in regular group therapy while hospitalized. Coping skills, problem solving as well as relaxation therapies were also part of the unit programming.  Labs were reviewed with the patient, and abnormal results were discussed with the patient. LFTs elevated, pt educated to follow up with his PCP regarding this. Cholesterol is 292, pt educated on healthy food choices & exercise and to f/u with his PCP as well.  The patient is able to verbalize their individual safety plan to this provider.  # It is recommended to the patient to continue psychiatric medications as prescribed, after discharge from the hospital.    # It is recommended to the patient to follow up with your outpatient psychiatric provider and PCP.  # It was discussed with the patient, the impact of alcohol, drugs, tobacco have been there overall psychiatric and medical wellbeing, and total abstinence from substance use was recommended the patient.ed.  # Prescriptions provided or sent directly to preferred pharmacy at discharge. Patient agreeable to plan. Given opportunity to ask questions. Appears to feel comfortable with discharge.    # In the event of worsening symptoms, the patient is instructed to call the crisis hotline (988), 911 and or go to the nearest ED for appropriate evaluation and treatment of symptoms. To follow-up with primary care provider for other medical issues, concerns and or health care needs  # Patient was discharged home with a plan to follow up as noted below.   Total Time spent with patient: 45 minutes  Musculoskeletal: Strength & Muscle Tone: within normal limits Gait & Station: normal Patient leans: N/A  Psychiatric Specialty Exam  Presentation  General Appearance:  Appropriate for Environment  Eye Contact: Good  Speech: Clear and Coherent  Speech Volume: Normal  Handedness: Right  Mood and Affect  Mood: Euthymic  Duration of Depression Symptoms: No data  recorded Affect: Congruent  Thought Process  Thought Processes: Coherent  Descriptions of Associations:Intact  Orientation:Full (Time, Place and Person)  Thought Content:Logical  History of Schizophrenia/Schizoaffective disorder:No data recorded Duration of Psychotic Symptoms:No data recorded Hallucinations:Hallucinations: None  Ideas of Reference:None  Suicidal Thoughts:Suicidal Thoughts: No  Homicidal Thoughts:Homicidal Thoughts: No  Sensorium  Memory: Immediate Good  Judgment: Good  Insight: Good  Executive Functions  Concentration: Good  Attention Span: Good  Recall: Good  Fund of Knowledge: Good  Language: Good  Psychomotor Activity  Psychomotor Activity: Psychomotor Activity: Normal  Assets  Assets: Communication Skills  Sleep  Sleep: Sleep: Good  Physical Exam: Physical Exam Review of Systems  Constitutional:  Negative for fever.  HENT: Negative.    Eyes: Negative.   Respiratory: Negative.    Cardiovascular: Negative.   Gastrointestinal: Negative.   Genitourinary: Negative.   Musculoskeletal: Negative.   Skin: Negative.   Neurological: Negative.   Psychiatric/Behavioral:  Positive for depression (Denies SI/HI/AVH, verbally contracts for safety outside of [facility]) and substance abuse (Educated on substance use cessation). Negative for hallucinations, memory loss and suicidal ideas. The patient is nervous/anxious (resolving on current meds) and has insomnia (resolving on current meds).    Blood pressure (!) 131/91, pulse 87, temperature 98.4 F (36.9 C), temperature source Oral, resp. rate 16, height '5\' 9"'$  (1.753 m), weight 80 kg, SpO2 97 %. Body mass index is 26.05 kg/m.  Mental Status Per Nursing Assessment::   On Admission:  Suicidal ideation indicated by others  Demographic Factors:  Male and Caucasian  Loss Factors: Loss of significant relationship  Historical Factors: Impulsivity  Risk Reduction Factors:    Responsible for children under 85 years of age, Sense of responsibility to family, Employed, and Positive social support  Continued Clinical Symptoms:  Patient's depressive symptoms, anxiety and insomnia have significantly reduced since hospitalization. He currently denies SI, denies HI, denies AVH, denies paranoia, there is no evidence of delusional thinking and he verbally contracts for safety outside of this [facility]  Cognitive Features That Contribute To Risk:  None    Suicide Risk:  Mild:  There are no identifiable suicide plans, no associated intent, mild dysphoria and related symptoms, good self-control (both objective and subjective assessment), few other risk factors, and identifiable protective factors, including available and accessible social support.    Follow-up Information     Group, [facility] Go on 12/23/2022.   Specialty: Behavioral Health Why: You have an appointment for medication management services on 12/23/22 at 10:30 am.  You also have an appointment for therapy services on 01/02/23 at 12:00 pm. Contact information: [place] [address] [phone]                Nicholes Rough, NP 12/20/2022, 10:21 AM

## 2022-12-20 NOTE — Group Note (Incomplete)
LCSW Group Therapy Note   Group Date: 12/20/2022 Start Time: 1100 End Time: 1200   Type of Therapy and Topic:  Group Therapy - Healthy vs Unhealthy Coping Skills  Participation Level:  {[facility]HH PARTICIPATION WO:[id number]   Description of Group The focus of this group was to determine what unhealthy coping techniques typically are used by group members and what healthy coping techniques would be helpful in coping with various problems. Patients were guided in becoming aware of the differences between healthy and unhealthy coping techniques. Patients were asked to identify 2-3 healthy coping skills they would like to learn to use more effectively.  Therapeutic Goals Patients learned that coping is what human beings do all day long to deal with various situations in their lives Patients defined and discussed healthy vs unhealthy coping techniques Patients identified their preferred coping techniques and identified whether these were healthy or unhealthy Patients determined 2-3 healthy coping skills they would like to become more familiar with and use more often. Patients provided support and ideas to each other   Summary of Patient Progress:  During group, patient expressed their definition and understanding of what it means to cope. Pt actively engaged in identifying unhealthy coping mechanisms they have utilized in the past. Pt actively engaged in processing means of coping and what outcomes occur from such methods. Pt further engaged in discussion, identifying healthy coping mechanisms they have used in the past. Pt engaged in processing the use of healthier mechanisms and how these produce different gains to unhealthy mechanisms. Pt actively identified other coping mechanisms they would be willing to try in the future. Pt proved receptive of alternate group members input and feedback from [place].   Therapeutic Modalities Cognitive Behavioral Therapy Motivational Interviewing  Darleen Crocker,  [place] 12/20/2022  10:31 AM

## 2022-12-20 NOTE — Progress Notes (Signed)
   12/20/22 0545  15 Minute Checks  Location Bedroom  Visual Appearance Calm  Behavior Sleeping  Sleep (Behavioral Health Patients Only)  Calculate sleep? (Click Yes once per 24 hr at 0600 safety check) Yes  Documented sleep last 24 hours 6.25

## 2022-12-23 ENCOUNTER — Ambulatory Visit (INDEPENDENT_AMBULATORY_CARE_PROVIDER_SITE_OTHER): Payer: Self-pay | Admitting: Behavioral Health

## 2022-12-23 DIAGNOSIS — F489 Nonpsychotic mental disorder, unspecified: Secondary | ICD-10-CM

## 2022-12-26 NOTE — Progress Notes (Signed)
Patient did not show for New Intake Visit. Additional fees may be assessed.

## 2022-12-26 NOTE — Progress Notes (Deleted)
Pt did not show for scheduled appointment.  

## 2023-01-02 ENCOUNTER — Ambulatory Visit: Payer: 59 | Admitting: Psychiatry

## 2023-01-16 ENCOUNTER — Ambulatory Visit: Payer: 59 | Admitting: Psychiatry

## 2023-01-30 ENCOUNTER — Ambulatory Visit (INDEPENDENT_AMBULATORY_CARE_PROVIDER_SITE_OTHER): Payer: 59 | Admitting: Psychiatry

## 2023-01-30 DIAGNOSIS — F4310 Post-traumatic stress disorder, unspecified: Secondary | ICD-10-CM

## 2023-01-30 NOTE — Progress Notes (Signed)
      [facility] Counselor/Therapist Progress Note  Patient ID: Aaron Burke, MRN: [id number],    Date: 01/30/2023  Time Spent: 24 minutes start time 12:10 PM end time 12:34 PM  Treatment Type: Individual Therapy  Reported Symptoms: anxiety, pain issues, triggered response, rumination  Mental Status Exam:  Appearance:   Well Groomed     Behavior:  Appropriate  Motor:  Normal  Speech/Language:   Normal Rate  Affect:  Appropriate  Mood:  anxious  Thought process:  normal  Thought content:    WNL  Sensory/Perceptual disturbances:    WNL  Orientation:  oriented to person, place, time/date, and situation  Attention:  Good  Concentration:  Good  Memory:  WNL  Fund of knowledge:   Good  Insight:    Good  Judgment:   Good  Impulse Control:  Good   Risk Assessment: Danger to Self:  No Self-injurious Behavior: No Danger to Others: No Duty to Warn:no Physical Aggression / Violence:No  Access to Firearms a concern: No  Gang Involvement:No   Subjective: Patient was present for session. He shared his wife kicked him out of his house and he ended up attempting suicide and had to go inpatient. He than went out of town and is now staying an extended stay place.  Patient explained that his Seroquel was increased and he feels like that to go away any of the intrusive thoughts and has helped him in other areas as well.  Patient reported that he really wanted to be at work due to needing the finances and resources.  He asked if session could be kept short due to that reason and that no processing be completed in the session.  Patient did share some of his hospital experience.  He was able to recognize through the discussion that it was very similar to what he had gone through was triggering.  He shared he was working hard to compartmentalize at this point so he was not dealing with any of those emotions.  Patient was encouraged to recognize that the emotions may still surface and he needs to be  prepared to manage them appropriately.  Taught patient CBT skill ST OP P and gave him handout to utilize also did DBT skill accepts with patient and discussed why that would be an important skill to use as well.  Also gave patient a handout on things that he can control and things that are out of his control to use to help him maintain perspective.  Interventions: Cognitive Behavioral Therapy, Dialectical Behavioral Therapy, and Solution-Oriented/Positive Psychology  Diagnosis:   ICD-10-CM   1. PTSD (post-traumatic stress disorder)  F43.10       Plan: Patient is to use coping skills to decrease triggered responses.  Patient is to work on Pharmacologist and handouts given to him in session.  Patient is to remind himself that he can move forward from the esophageal cancer. Long term: Recall the traumatic event without becoming overwhelmed with negative emotions Short term goal: Practice, implement relaxation training as a coping mechanism for tension, panic, stress, anger, and anxiety  Stevphen Meuse, [facility]

## 2023-02-07 ENCOUNTER — Telehealth (INDEPENDENT_AMBULATORY_CARE_PROVIDER_SITE_OTHER): Payer: Self-pay | Admitting: Behavioral Health

## 2023-02-07 DIAGNOSIS — Z0389 Encounter for observation for other suspected diseases and conditions ruled out: Secondary | ICD-10-CM

## 2023-02-07 NOTE — Progress Notes (Signed)
Patient was scheduled for video visit through my chart. Waited 15 min for pt to log in and called recommended number without ability to leave mssg. Pt did not notify office of technical difficulties and additional fees to be assessed.

## 2023-02-13 ENCOUNTER — Ambulatory Visit (INDEPENDENT_AMBULATORY_CARE_PROVIDER_SITE_OTHER): Payer: Self-pay | Admitting: Psychiatry

## 2023-02-13 DIAGNOSIS — F4310 Post-traumatic stress disorder, unspecified: Secondary | ICD-10-CM

## 2023-02-14 ENCOUNTER — Telehealth: Payer: 59 | Admitting: Behavioral Health

## 2023-02-14 DIAGNOSIS — Z0389 Encounter for observation for other suspected diseases and conditions ruled out: Secondary | ICD-10-CM

## 2023-02-14 NOTE — Progress Notes (Signed)
Pt has had two previous no shows and his new patient to me. His first visit will need to be in person. No charges will be assess this visit.  He was advised to contact office to reschedule.

## 2023-02-15 NOTE — Progress Notes (Signed)
Called patient and set length concerning session that he did not click on to session.

## 2023-02-21 ENCOUNTER — Ambulatory Visit: Payer: 59 | Admitting: Behavioral Health

## 2023-02-22 ENCOUNTER — Encounter: Payer: Self-pay | Admitting: Behavioral Health

## 2023-02-22 ENCOUNTER — Ambulatory Visit (INDEPENDENT_AMBULATORY_CARE_PROVIDER_SITE_OTHER): Payer: 59 | Admitting: Behavioral Health

## 2023-02-22 VITALS — BP 155/95 | HR 115 | Ht 69.0 in | Wt 206.0 lb

## 2023-02-22 DIAGNOSIS — Z9151 Personal history of suicidal behavior: Secondary | ICD-10-CM

## 2023-02-22 DIAGNOSIS — F331 Major depressive disorder, recurrent, moderate: Secondary | ICD-10-CM | POA: Diagnosis not present

## 2023-02-22 DIAGNOSIS — F902 Attention-deficit hyperactivity disorder, combined type: Secondary | ICD-10-CM | POA: Diagnosis not present

## 2023-02-22 DIAGNOSIS — F411 Generalized anxiety disorder: Secondary | ICD-10-CM | POA: Diagnosis not present

## 2023-02-22 MED ORDER — LORAZEPAM 1 MG PO TABS: ORAL_TABLET | ORAL | 0 refills | Status: DC

## 2023-02-22 MED ORDER — AMPHETAMINE-DEXTROAMPHETAMINE 30 MG PO TABS: ORAL_TABLET | ORAL | 0 refills | Status: DC

## 2023-02-22 MED ORDER — AMPHETAMINE-DEXTROAMPHETAMINE 10 MG PO TABS: ORAL_TABLET | ORAL | 0 refills | Status: DC

## 2023-02-22 NOTE — Progress Notes (Signed)
[facility] MD/PA/NP Initial Note  02/22/2023 11:35 AM [name]  MRN:  [id number]  Chief Complaint:  Chief Complaint   Anxiety; Depression; ADHD; Medication Refill; Patient Education; Establish Care     HPI:  "Aaron Burke", 37 year old male presents to this office for initial visit and to establish care.  Collateral information should be considered reliable.  Patient states that he is here today because his PCP told him that he would need to see psychiatry for continuation of his ADHD medication as well as he has Ativan for anxiety.  Patient acknowledges a previous suicide attempt and IVC at [facility], ER in February, 2024.  Patient says that he has been experiencing extreme stress and anxiety in the past due to marriage difficulties and now declaration of divorce.  Says wife had kicked him out of home and he has a 35-1/2-year-old son to consider.  Patient says that his medications are working well and that he has been taking medications as prescribed.  He states that he does not feel any current suicidal ideation.  Patient feels safe and he also verbally contracted for safety with this Clinical research associate.  He says that he has been using his Ativan very sparingly and that it has been approximately 2 weeks since his last use.  Current dose of his Adderall has been working well with attention and focus.  He says that his depression is currently 4/10.  And anxiety is 2/10.  He is sleeping 6 to 7 hours per night.  He does endorse irritability, racing thoughts, and periodic increased anxiety.  He denies any current mania, no psychosis, no auditory or visual hallucinations.  He denies any current cannabis use and now reports very light EtOH use.  He has acquired an attorney to handle his divorce and custody issues over his son.  He was living in an extended stays but now says that he has acquired an apartment.  Prior psychiatric medications: Adderall Cymbalta Seroquel Ativan      Visit Diagnosis:     ICD-10-CM   1. Attention deficit hyperactivity disorder (ADHD), combined type  F90.2 amphetamine-dextroamphetamine (ADDERALL) 30 MG tablet    amphetamine-dextroamphetamine (ADDERALL) 10 MG tablet    2. Major depressive disorder, recurrent episode, moderate (HCC)  F33.1     3. Generalized anxiety disorder  F41.1 LORazepam (ATIVAN) 1 MG tablet    4. Hx of suicide attempt  Z91.51       Past Psychiatric History: Attempted Suicide attempt, IVC, Gen Anxiety, MDD, ETOH use disorder, PTSD  Past Medical History:  Past Medical History:  Diagnosis Date   Gallstone pancreatitis 2020   GERD (gastroesophageal reflux disease)    Plaque psoriasis    Psoriatic arthritis (HCC)    Testicular torsion 2020    Past Surgical History:  Procedure Laterality Date   CHOLECYSTECTOMY     ESOPHAGOGASTRODUODENOSCOPY  2020   with dilatation   KNEE RECONSTRUCTION Left 08/30/2004   LEFT HEART CATH AND CORONARY ANGIOGRAPHY N/A 09/29/2020   Procedure: LEFT HEART CATH AND CORONARY ANGIOGRAPHY;  Surgeon: Swaziland, Peter M, MD;  [facility];  Service: Cardiovascular;  Laterality: N/A;    Family Psychiatric History: see chart  Family History:  Family History  Problem Relation Age of Onset   Hyperlipidemia Mother    Heart attack Father        x 2, total 4 stents   Bipolar disorder Paternal Uncle    Bipolar disorder Maternal Grandmother    Heart attack Paternal Grandfather  Bipolar disorder Other     Social History:  Social History   Socioeconomic History   Marital status: Legally Separated    Spouse name: Not on file   Number of children: 1   Years of education: 16   Highest education level: Bachelor's degree (e.g., BA, AB, BS)  Occupational History   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  Enjoys golf, hunting, surfing, outdoor sports.    Social Determinants of Health   Financial Resource Strain: Not on file  Food Insecurity: No Food Insecurity (12/13/2022)   Hunger Vital Sign    Worried About Running Out of Food in the Last Year: Never true    Ran Out of Food in the Last Year: Never true  Transportation Needs: Patient Declined (12/13/2022)   PRAPARE - Administrator, Civil Service (Medical): Patient declined    Lack of Transportation (Non-Medical): Patient declined  Physical Activity: Not on file  Stress: Not on file  Social Connections: Not on file    Allergies:  Allergies  Allergen Reactions   Percocet [Oxycodone-Acetaminophen] Other (See Comments)    "makes me feel uncomfortable"    Metabolic Disorder Labs: Lab Results  Component Value Date   HGBA1C 6.1 (H) 12/14/2022   MPG 128.37 12/14/2022   MPG 128.37 09/27/2020   No results found for: "PROLACTIN" Lab Results  Component Value Date   CHOL 292 (H) 12/14/2022   TRIG 98 12/14/2022   HDL 77 12/14/2022   CHOLHDL 3.8 12/14/2022   VLDL 20 12/14/2022   LDLCALC 195 (H) 12/14/2022   LDLCALC 222 (H) 10/30/2020   Lab Results  Component Value Date   TSH 1.322 12/14/2022   TSH 2.962 09/27/2020    Therapeutic Level Labs: No results found for: "LITHIUM" No results found for: "VALPROATE" No results found for: "CBMZ"  Current Medications: Current Outpatient Medications  Medication Sig Dispense Refill   DULoxetine (CYMBALTA) 60 MG capsule Take 120 mg by mouth daily.     QUEtiapine (SEROQUEL) 50 MG tablet Take by mouth.     amphetamine-dextroamphetamine (ADDERALL) 10 MG tablet Take one tablet by mouth in the  afternoon for boost 30 tablet 0   amphetamine-dextroamphetamine (ADDERALL) 30 MG tablet Take one tablet by mouth daily after breakfast 30 tablet 0   Apremilast (OTEZLA) 30 MG TABS Take 1 tablet (30 mg total) by mouth 2 (two) times daily. 60 tablet 0   LORazepam (ATIVAN) 1 MG tablet Take one tablet by mouth daily only for  severe anxiety. 30 tablet 0   pantoprazole (PROTONIX) 40 MG tablet Take 1 tablet (40 mg total) by mouth daily. 30 tablet 0   QUEtiapine Fumarate 150 MG TABS Take 150 mg by mouth at bedtime. 30 tablet 0   Vitamin D, Ergocalciferol, (DRISDOL) 1.25 MG (50000 UNIT) CAPS capsule Take 1 capsule (50,000 Units total) by mouth once a week. 5 capsule 0   No current facility-administered medications for this  visit.    Medication Side Effects: none  Orders placed this visit:  No orders of the defined types were placed in this encounter.   Psychiatric Specialty Exam:  Review of Systems  Constitutional: Negative.   Allergic/Immunologic: Negative.   Neurological: Negative.   Psychiatric/Behavioral: Negative.      Blood pressure (!) 155/95, pulse (!) 115, height 5\' 9"  (1.753 m), weight 206 lb (93.4 kg).Body mass index is 30.42 kg/m.  General Appearance: Casual, Neat, and Well Groomed  Eye Contact:  Good  Speech:  Clear and Coherent  Volume:  Normal  Mood:  Anxious, Depressed, and Dysphoric  Affect:  Appropriate, Depressed, and Anxious  Thought Process:  Coherent  Orientation:  Full (Time, Place, and Person)  Thought Content: Logical   Suicidal Thoughts:  No  Homicidal Thoughts:  No  Memory:  WNL  Judgement:  Good  Insight:  Good  Psychomotor Activity:  Normal  Concentration:  Concentration: Good  Recall:  Good  Fund of Knowledge: Good  Language: Good  Assets:  Desire for Improvement  ADL's:  Intact  Cognition: WNL  Prognosis:  Good   Screenings:  AIMS    Flowsheet Row [facility]  AIMS  Total Score 0      AUDIT    Flowsheet Row [facility]  Alcohol Use Disorder Identification Test Final Score (AUDIT) 3      PHQ2-9    Flowsheet Row Office Visit from [facility]/22/2023 in [facility]  PHQ-2 Total Score 2  PHQ-9 Total Score 6      Flowsheet Row [facility]  C-SSRS RISK CATEGORY No Risk       Receiving Psychotherapy: Stevphen Meuse  Treatment Plan/Recommendations:   Greater than 50% of  45 min face to face time with patient was spent on counseling and coordination of care. We discussed his long history with ADHD.  We discussed family dynamics to include his process of divorce from his wife.  He is very sad about this and has experienced trouble adjusting.  We discussed his previous reported suicide attempt and his IVC to [facility] ER back on 2/18. He state that he was under extreme amount of stress due to his marital situation. We talked about his poc and current medications. He does not wish to adjust or change medications at this time.  Also reviewed patient's high BP and HR this visit.  On review of charts, patient has presented with high BP on other visits.  Highly encouraged patient to follow-up with PCP to discuss his BP further.  Patient was advised to check blood pressure regularly and keep log.  Patient was encouraged to reach out to counselor or emergency resources if experiencing any future SI.  We agreed to:  Will continue Seroquel 50 mg in the morning, 50 mg in the afternoon, and 150 mg at bedtime. To continue Adderall 30 mg in the a.m., then 10 mg in the afternoon for boost. Will continue duloxetine 60 mg twice daily. Will continue Ativan 1 mg by mouth daily as needed only for severe anxiety. Will report side effects or worsening symptoms promptly Provided emergency contact information  to include nearest ER, [facility], 911, and after-hours number, suicide hotline. ,Discussed potential benefits, risks, and side effects of stimulants with patient to include increased heart rate, palpitations, insomnia, increased anxiety, increased irritability,  or decreased appetite.  Instructed patient to contact office if experiencing any significant tolerability issues.  Discussed potential benefits, risk, and side effects of benzodiazepines to include potential risk of tolerance and dependence, as well as possible drowsiness.  Advised patient not to drive if experiencing drowsiness and to take lowest possible effective dose to minimize risk of dependence and tolerance.  Discussed potential metabolic side effects associated with atypical antipsychotics, as well as potential risk for movement side effects. Advised pt to contact office if movement side effects occur.   Reviewed PDMP     Joan Flores, NP

## 2023-02-24 ENCOUNTER — Other Ambulatory Visit: Payer: Self-pay

## 2023-02-24 ENCOUNTER — Ambulatory Visit: Payer: 59 | Admitting: Orthopaedic Surgery

## 2023-02-24 DIAGNOSIS — M25552 Pain in left hip: Secondary | ICD-10-CM | POA: Diagnosis not present

## 2023-02-24 DIAGNOSIS — M545 Low back pain, unspecified: Secondary | ICD-10-CM | POA: Insufficient documentation

## 2023-02-24 MED ORDER — METHOCARBAMOL 750 MG PO TABS: 750.0000 mg | ORAL_TABLET | Freq: Two times a day (BID) | ORAL | 3 refills | Status: DC | PRN

## 2023-02-24 MED ORDER — PREDNISONE 10 MG (21) PO TBPK: ORAL_TABLET | ORAL | 3 refills | Status: DC

## 2023-02-24 NOTE — Progress Notes (Signed)
Office Visit Note   Patient: Aaron Burke           Date of Birth: Feb 02, 1986           MRN: [id number] Visit Date: 02/24/2023              Requested by: Aaron Burke [address] [place],  [place] [address] PCP: Aaron Burke   Assessment & Plan: Visit Diagnoses:  1. Pain of left hip     Plan: Impression is a 37 year old gentleman with left leg lumbar radiculopathy.  Disease process explained.  Patient not demonstrating any red flag symptoms.  Will prescribe steroid Dosepak and Robaxin and activity modification.  Explained that he may have to take a couple rounds of steroids for complete resolution.  Follow-Up Instructions: No follow-ups on file.   Orders:  Orders Placed This Encounter  Procedures   XR HIP UNILAT W OR W/O PELVIS 2-3 VIEWS LEFT   Meds ordered this encounter  Medications   predniSONE (STERAPRED UNI-PAK 21 TAB) 10 MG (21) TBPK tablet    Sig: Take as directed    Dispense:  21 tablet    Refill:  3   methocarbamol (ROBAXIN) 750 MG tablet    Sig: Take 1 tablet (750 mg total) by mouth 2 (two) times daily as needed for muscle spasms.    Dispense:  20 tablet    Refill:  3      Procedures: No procedures performed   Clinical Data: No additional findings.   Subjective: Chief Complaint  Patient presents with   Left Hip - Pain    HPI Aaron Burke is a 37 year old gentleman who started having low back pain and left leg numbness and tingling starting Tuesday.  He played golf on Monday and says that he may have felt something in his back.  Denies any red flag symptoms.  Endorses numbness and tingling down the left leg. Review of Systems  Constitutional: Negative.   HENT: Negative.    Eyes: Negative.   Respiratory: Negative.    Cardiovascular: Negative.   Gastrointestinal: Negative.   Endocrine: Negative.   Genitourinary: Negative.   Skin: Negative.   Allergic/Immunologic: Negative.   Neurological: Negative.   Hematological:  Negative.   Psychiatric/Behavioral: Negative.    All other systems reviewed and are negative.    Objective: Vital Signs: There were no vitals taken for this visit.  Physical Exam Vitals and nursing note reviewed.  Constitutional:      Appearance: He is well-developed.  HENT:     Head: Normocephalic and atraumatic.  Eyes:     Pupils: Pupils are equal, round, and reactive to light.  Pulmonary:     Effort: Pulmonary effort is normal.  Abdominal:     Palpations: Abdomen is soft.  Musculoskeletal:        General: Normal range of motion.     Cervical back: Neck supple.  Skin:    General: Skin is warm.  Neurological:     Mental Status: He is alert and oriented to person, place, and time.  Psychiatric:        Behavior: Behavior normal.        Thought Content: Thought content normal.        Judgment: Judgment normal.     Ortho Exam Examination lumbar spine shows positive straight leg raise.  Hip range of motion is well-tolerated.  No focal motor or sensory deficits. Specialty Comments:  No specialty comments available.  Imaging: No results  found.   PMFS History: Patient Active Problem List   Diagnosis Date Noted   MDD (major depressive disorder), recurrent severe, without psychosis (HCC) 12/13/2022   Alcohol use disorder 12/13/2022   GERD (gastroesophageal reflux disease) 12/13/2022   Attention deficit hyperactivity disorder (ADHD) 12/13/2022   GAD (generalized anxiety disorder) 12/13/2022   Suicide attempt (HCC) 12/12/2022   Alcohol abuse with intoxication (HCC) 12/12/2022   PTSD (post-traumatic stress disorder) 12/12/2022   Hyperlipidemia LDL goal <70 09/29/2020   Pancreatic mass 09/29/2020   Elevated LFTs 09/29/2020   Pre-diabetes 09/29/2020   Chest pain with moderate risk for cardiac etiology 09/27/2020   Past Medical History:  Diagnosis Date   Gallstone pancreatitis 2020   GERD (gastroesophageal reflux disease)    Plaque psoriasis    Psoriatic arthritis  (HCC)    Testicular torsion 2020    Family History  Problem Relation Age of Onset   Hyperlipidemia Mother    Heart attack Father        x 2, total 4 stents   Bipolar disorder Paternal Uncle    Bipolar disorder Maternal Grandmother    Heart attack Paternal Grandfather    Bipolar disorder Other     Past Surgical History:  Procedure Laterality Date   CHOLECYSTECTOMY     ESOPHAGOGASTRODUODENOSCOPY  2020   with dilatation   KNEE RECONSTRUCTION Left 08/30/2004   LEFT HEART CATH AND CORONARY ANGIOGRAPHY N/A 09/29/2020   Procedure: LEFT HEART CATH AND CORONARY ANGIOGRAPHY;  Surgeon: Swaziland, Peter M, Burke;  [facility]: [facility];  Service: Cardiovascular;  Laterality: N/A;   Social History   Occupational History   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

## 2023-02-27 ENCOUNTER — Ambulatory Visit (INDEPENDENT_AMBULATORY_CARE_PROVIDER_SITE_OTHER): Payer: 59 | Admitting: Psychiatry

## 2023-02-27 ENCOUNTER — Telehealth: Payer: Self-pay | Admitting: Psychiatry

## 2023-02-27 DIAGNOSIS — F4310 Post-traumatic stress disorder, unspecified: Secondary | ICD-10-CM

## 2023-02-27 NOTE — Progress Notes (Signed)
      [facility] Counselor/Therapist Progress Note  Patient ID: Birch Schetter, MRN: [id number],    Date: 02/27/2023  Time Spent: 42 minutes start time 12:00 PM end time 12:42 PM Virtual Visit via Video Note Connected with patient by a telemedicine/telehealth application, with their informed consent, and verified patient privacy and that I am speaking with the correct person using two identifiers. I discussed the limitations, risks, security and privacy concerns of performing psychotherapy and the availability of in person appointments. I also discussed with the patient that there may be a patient responsible charge related to this service. The patient expressed understanding and agreed to proceed. I discussed the treatment planning with the patient. The patient was provided an opportunity to ask questions and all were answered. The patient agreed with the plan and demonstrated an understanding of the instructions. The patient was advised to call  our office if  symptoms worsen or feel they are in a crisis state and need immediate contact.   Therapist Location: [facility] Patient Location: home    Treatment Type: Individual Therapy  Reported Symptoms: depression, triggered responses, anxiety, focusing issues, rumination  Mental Status Exam:  Appearance:   Well Groomed     Behavior:  Appropriate  Motor:  Normal  Speech/Language:   Normal Rate  Affect:  Appropriate  Mood:  anxious  Thought process:  normal  Thought content:    WNL  Sensory/Perceptual disturbances:    WNL  Orientation:  oriented to person, place, time/date, and situation  Attention:  Good  Concentration:  Good  Memory:  WNL  Fund of knowledge:   Good  Insight:    Good  Judgment:   Good  Impulse Control:  Good   Risk Assessment: Danger to Self:  No Self-injurious Behavior: No Danger to Others: No Duty to Warn:no Physical Aggression / Violence:No  Access to Firearms a concern: No  Gang Involvement:No    Subjective: Met with patient via virtual session. He shared that he is having a hard time due to not being able to have much contact with his son. He shared that he is considering taking other jobs to figure out how he can make a living.He shared he is trying to think responsibly and make sure that his needs are met. Patient shared that the hardest thing currently is not being able to see his son much and the separation. He did processing set on that issue SUDS level 10 negative cogntion "I'm a piece of crap" felt hurt and sadness in his chest ans .    Interventions: Solution-Oriented/Positive Psychology, Eye Movement Desensitization and Reprocessing (EMDR), and Insight-Oriented  Diagnosis:   ICD-10-CM   1. PTSD (post-traumatic stress disorder)  F43.10       Plan:  Patient is to use coping skills to decrease triggered responses.  Patient is to work on Pharmacologist and handouts given to him in session.  Patient is to remind himself that he can move forward from the esophageal cancer. Long term: Recall the traumatic event without becoming overwhelmed with negative emotions Short term goal: Practice, implement relaxation training as a coping mechanism for tension, panic, stress, anger, and anxiety    Stevphen Meuse, [facility]

## 2023-02-27 NOTE — Telephone Encounter (Signed)
Mr. pheonix, obas are scheduled for a virtual visit with your provider today.    Just as we do with appointments in the office, we must obtain your consent to participate.  Your consent will be active for this visit and any virtual visit you may have with one of our providers in the next 365 days.    If you have a MyChart account, I can also send a copy of this consent to you electronically.  All virtual visits are billed to your insurance company just like a traditional visit in the office.  As this is a virtual visit, video technology does not allow for your provider to perform a traditional examination.  This may limit your provider's ability to fully assess your condition.  If your provider identifies any concerns that need to be evaluated in person or the need to arrange testing such as labs, EKG, etc, we will make arrangements to do so.    Although advances in technology are sophisticated, we cannot ensure that it will always work on either your end or our end.  If the connection with a video visit is poor, we may have to switch to a telephone visit.  With either a video or telephone visit, we are not always able to ensure that we have a secure connection.   I need to obtain your verbal consent now.   Are you willing to proceed with your visit today?   Kwesi Slicer has provided verbal consent on 02/27/2023 for a virtual visit (video or telephone).   Stevphen Meuse, [facility] 02/27/2023  12:01 PM

## 2023-03-13 IMAGING — CR DG HIP (WITH OR WITHOUT PELVIS) 2-3V*L*
3 series · 3 of 3 positions shown · non-contrast
Comparison: None.

CLINICAL DATA: Left hip pain following fall 1 month ago, initial
encounter

EXAM:
DG HIP (WITH OR WITHOUT PELVIS) 2-3V LEFT

[w pelvis upright]
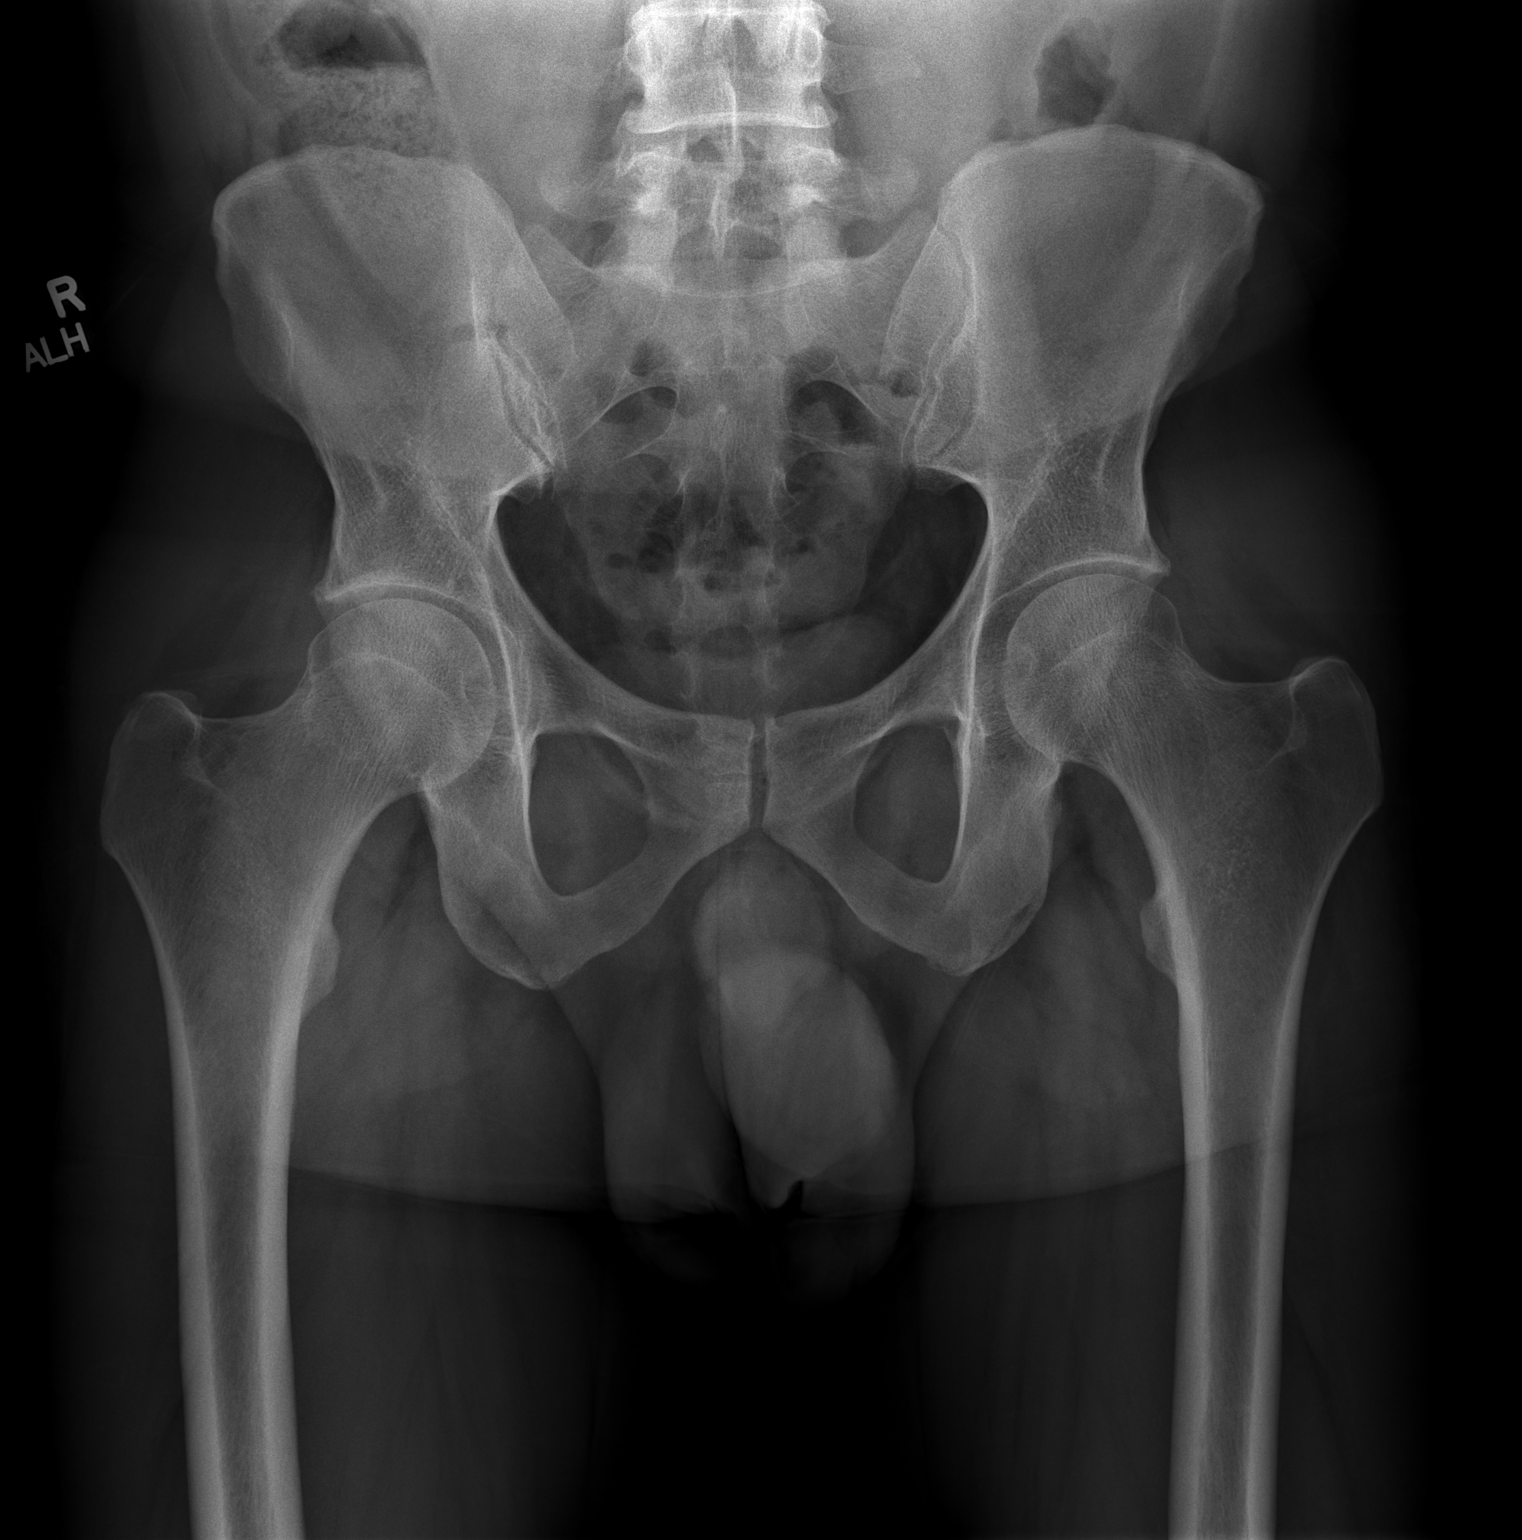

[w hip lat left (1 of 2)]
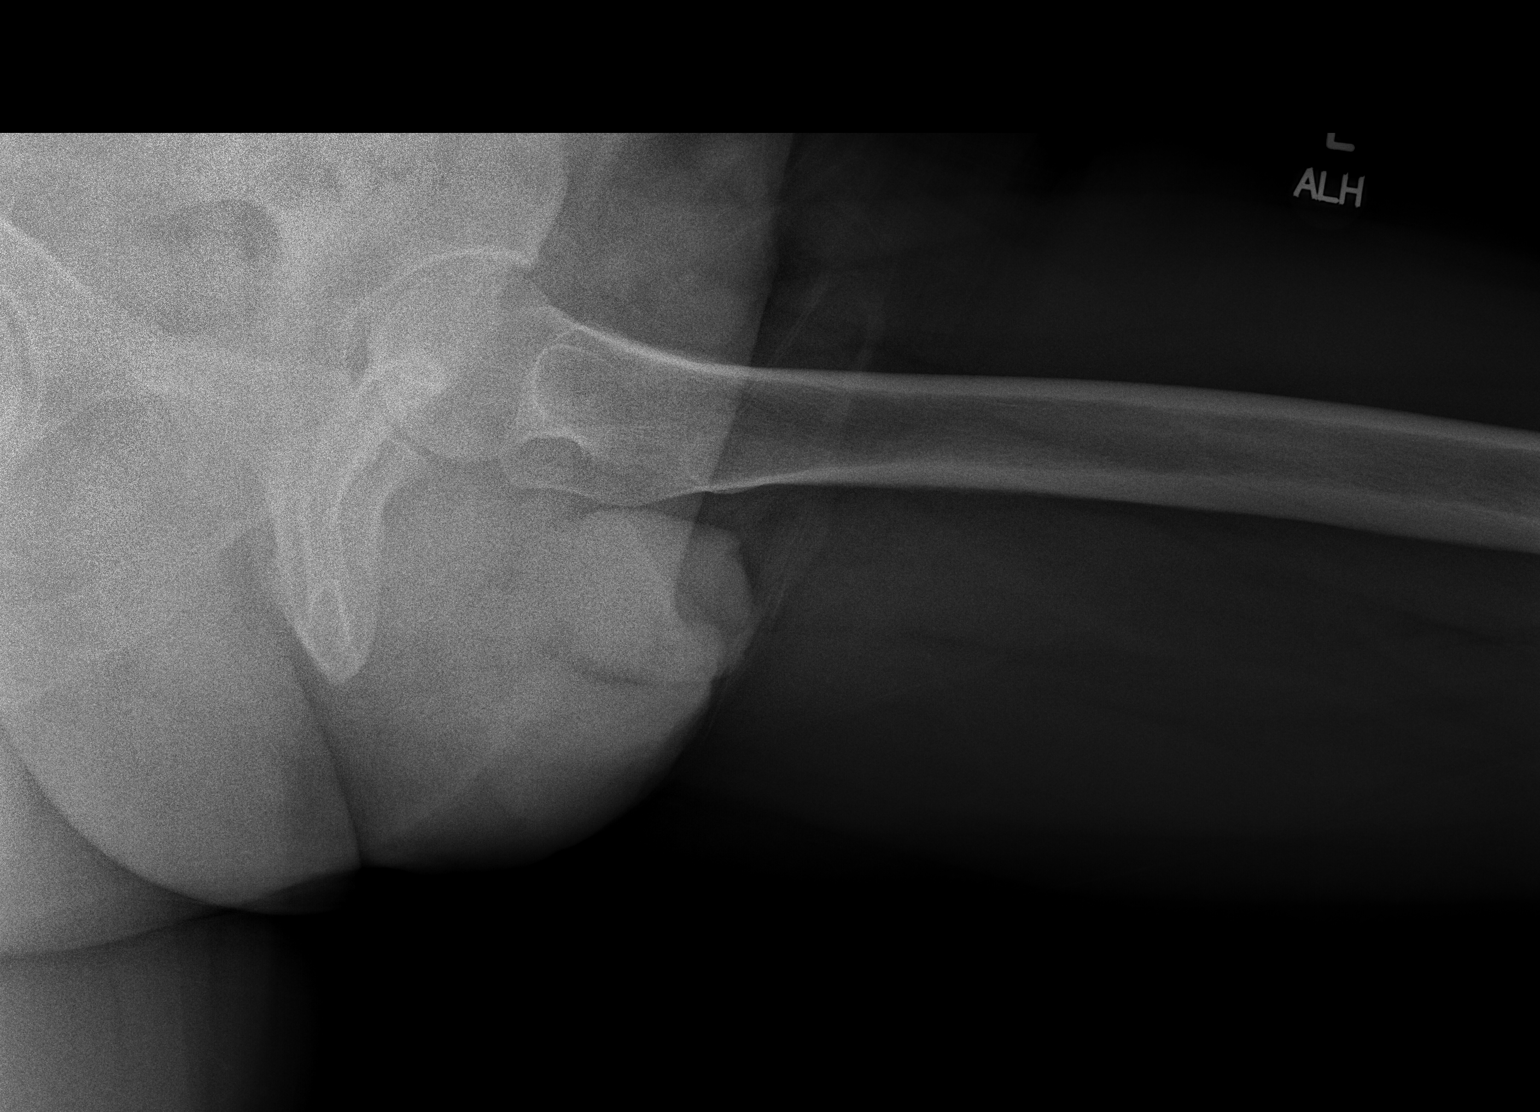

[w hip lat left (2 of 2)]
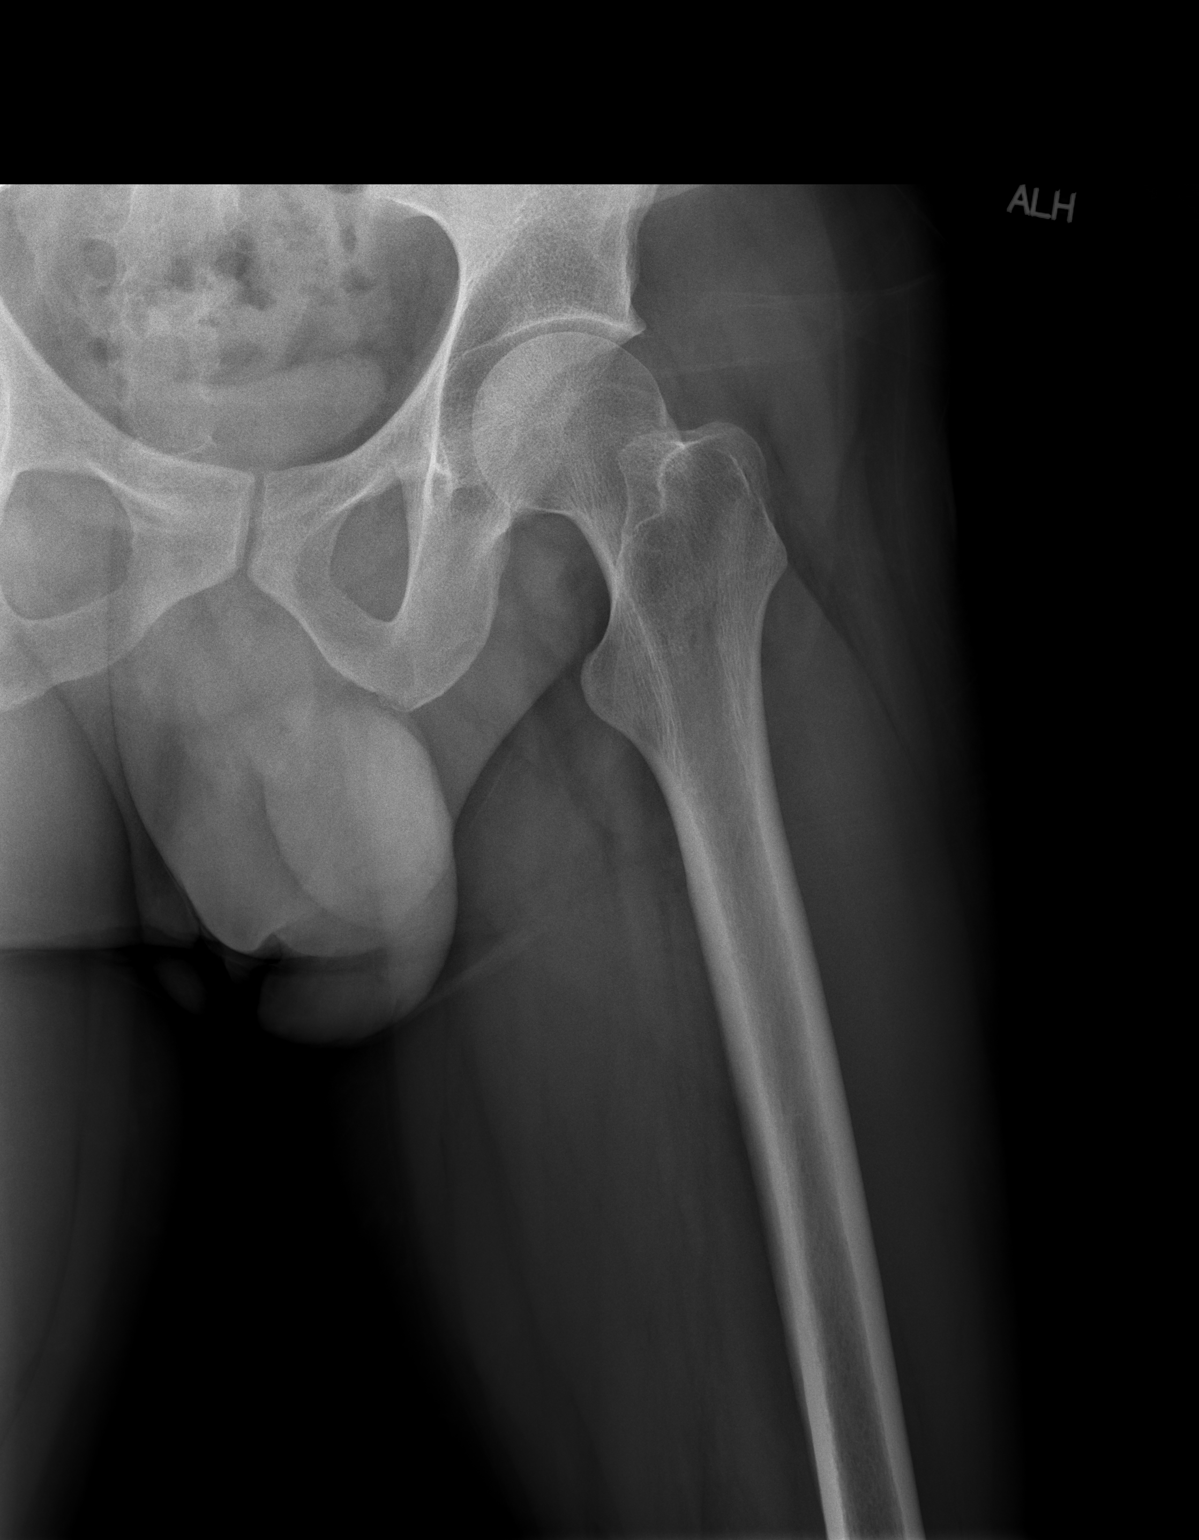

[3 of 3 positions shown; findings below may reference images not displayed]

FINDINGS: Pelvic ring is intact. No acute fracture or dislocation is noted. No
soft tissue abnormality is seen.
IMPRESSION: No acute abnormality noted.

## 2023-03-27 ENCOUNTER — Other Ambulatory Visit: Payer: Self-pay | Admitting: Behavioral Health

## 2023-03-27 ENCOUNTER — Telehealth: Payer: Self-pay | Admitting: Behavioral Health

## 2023-03-27 DIAGNOSIS — F902 Attention-deficit hyperactivity disorder, combined type: Secondary | ICD-10-CM

## 2023-03-27 MED ORDER — AMPHETAMINE-DEXTROAMPHETAMINE 10 MG PO TABS: ORAL_TABLET | ORAL | 0 refills | Status: DC

## 2023-03-27 MED ORDER — AMPHETAMINE-DEXTROAMPHETAMINE 30 MG PO TABS: ORAL_TABLET | ORAL | 0 refills | Status: DC

## 2023-03-27 NOTE — Telephone Encounter (Signed)
Pt called asking for a refill of his adderall 30 mg. Pharmacy is [facility] in [place] on [address]. Pt also said that he has gain 50 pounds since being on the seroquel since march. He either would like a med for weight loss or change the seroquel. Please give hinm a call at [phone]

## 2023-03-31 ENCOUNTER — Telehealth: Payer: Self-pay

## 2023-03-31 NOTE — Telephone Encounter (Signed)
Prior Authorization Amphetamine-Dextroamphetamine 10MG  tablets #30/30 [employer]  Approved Effective:  03/30/2023 to 03/29/2026

## 2023-04-24 ENCOUNTER — Ambulatory Visit (INDEPENDENT_AMBULATORY_CARE_PROVIDER_SITE_OTHER): Payer: 59 | Admitting: Behavioral Health

## 2023-04-24 ENCOUNTER — Encounter: Payer: Self-pay | Admitting: Behavioral Health

## 2023-04-24 DIAGNOSIS — F331 Major depressive disorder, recurrent, moderate: Secondary | ICD-10-CM

## 2023-04-24 DIAGNOSIS — F411 Generalized anxiety disorder: Secondary | ICD-10-CM

## 2023-04-24 DIAGNOSIS — F902 Attention-deficit hyperactivity disorder, combined type: Secondary | ICD-10-CM

## 2023-04-24 DIAGNOSIS — F4310 Post-traumatic stress disorder, unspecified: Secondary | ICD-10-CM | POA: Diagnosis not present

## 2023-04-24 MED ORDER — AMPHETAMINE-DEXTROAMPHETAMINE 30 MG PO TABS: ORAL_TABLET | ORAL | 0 refills | Status: DC

## 2023-04-24 MED ORDER — DULOXETINE HCL 60 MG PO CPEP: 120.0000 mg | ORAL_CAPSULE | Freq: Every day | ORAL | 1 refills | Status: DC

## 2023-04-24 MED ORDER — AMPHETAMINE-DEXTROAMPHETAMINE 10 MG PO TABS: ORAL_TABLET | ORAL | 0 refills | Status: DC

## 2023-04-24 MED ORDER — QUETIAPINE FUMARATE 150 MG PO TABS: 150.0000 mg | ORAL_TABLET | Freq: Every day | ORAL | 1 refills | Status: DC

## 2023-04-24 MED ORDER — LORAZEPAM 1 MG PO TABS: ORAL_TABLET | ORAL | 2 refills | Status: DC

## 2023-04-24 NOTE — Progress Notes (Signed)
[facility]  Patient ID: Aaron Burke,  MRN: [id number]  PCP: Clayborn Heron, MD  Date of Evaluation: 04/24/2023 Time spent:30 minutes  Chief Complaint:  Chief Complaint   Anxiety; Depression; Follow-up; Medication Refill; Medication Problem; Patient Education     HISTORY/CURRENT STATUS: HPI "Aaron Burke", 37 year old male presents to this for follow up and medication management.  Collateral information should be considered reliable.  Patient says medication has been working well. His moods have been more stable. He is requesting no medication changes this visit.  He states that he does not feel any current suicidal ideation.  Patient feels safe and he also verbally contracted for safety with this Clinical research associate.  He says that he has been using his Ativan very sparingly and that it has been approximately 2 weeks since his last use.  Current dose of his Adderall has been working well with attention and focus.  He says that his depression is currently 3/10.  And anxiety is 2/10.  He is sleeping 6 to 7 hours per night.  He denies any current mania, no psychosis, no auditory or visual hallucinations.  He denies any current cannabis use and now reports very light EtOH use.  He has acquired an attorney to handle his divorce and custody issues over his son.  He was living in an extended stays but now says that he has acquired an apartment.   Prior psychiatric medications: Adderall Cymbalta Seroquel Ativan   Individual Medical History/ Review of Systems: Changes? :No   Allergies: Percocet [oxycodone-acetaminophen]  Current Medications:  Current Outpatient Medications:    [START ON 04/26/2023] amphetamine-dextroamphetamine (ADDERALL) 10 MG tablet, Take one tablet by mouth in the afternoon for boost, Disp: 30 tablet, Rfl: 0   [START ON 04/26/2023] amphetamine-dextroamphetamine (ADDERALL) 30 MG tablet, Take one tablet by mouth daily after breakfast, Disp: 30 tablet, Rfl: 0   Apremilast  (OTEZLA) 30 MG TABS, Take 1 tablet (30 mg total) by mouth 2 (two) times daily., Disp: 60 tablet, Rfl: 0   DULoxetine (CYMBALTA) 60 MG capsule, Take 2 capsules (120 mg total) by mouth daily., Disp: 180 capsule, Rfl: 1   LORazepam (ATIVAN) 1 MG tablet, Take one tablet by mouth daily only for  severe anxiety., Disp: 30 tablet, Rfl: 2   methocarbamol (ROBAXIN) 750 MG tablet, Take 1 tablet (750 mg total) by mouth 2 (two) times daily as needed for muscle spasms., Disp: 20 tablet, Rfl: 3   pantoprazole (PROTONIX) 40 MG tablet, Take 1 tablet (40 mg total) by mouth daily., Disp: 30 tablet, Rfl: 0   predniSONE (STERAPRED UNI-PAK 21 TAB) 10 MG (21) TBPK tablet, Take as directed, Disp: 21 tablet, Rfl: 3   QUEtiapine (SEROQUEL) 50 MG tablet, Take by mouth., Disp: , Rfl:    QUEtiapine Fumarate 150 MG TABS, Take 150 mg by mouth at bedtime., Disp: 90 tablet, Rfl: 1   Vitamin D, Ergocalciferol, (DRISDOL) 1.25 MG (50000 UNIT) CAPS capsule, Take 1 capsule (50,000 Units total) by mouth once a week., Disp: 5 capsule, Rfl: 0 Medication Side Effects: none  Family Medical/ Social History: Changes? No  MENTAL HEALTH EXAM:  There were no vitals taken for this visit.There is no height or weight on file to calculate BMI.  General Appearance: Casual, Neat, and Well Groomed  Eye Contact:  Good  Speech:  Clear and Coherent  Volume:  Normal  Mood:  Anxious, Depressed, and Dysphoric  Affect:  Appropriate  Thought Process:  Coherent  Orientation:  Full (Time, Place, and Person)  Thought Content: Logical   Suicidal Thoughts:  No  Homicidal Thoughts:  No  Memory:  WNL  Judgement:  Good  Insight:  Good  Psychomotor Activity:  Normal  Concentration:  Concentration: Good  Recall:  Good  Fund of Knowledge: Good  Language: Good  Assets:  Desire for Improvement  ADL's:  Intact  Cognition: WNL  Prognosis:  Good    DIAGNOSES:    ICD-10-CM   1. Generalized anxiety disorder  F41.1 DULoxetine (CYMBALTA) 60 MG capsule     QUEtiapine Fumarate 150 MG TABS    LORazepam (ATIVAN) 1 MG tablet    2. PTSD (post-traumatic stress disorder)  F43.10 DULoxetine (CYMBALTA) 60 MG capsule    3. Major depressive disorder, recurrent episode, moderate (HCC)  F33.1 DULoxetine (CYMBALTA) 60 MG capsule    QUEtiapine Fumarate 150 MG TABS    4. Attention deficit hyperactivity disorder (ADHD), combined type  F90.2 amphetamine-dextroamphetamine (ADDERALL) 30 MG tablet    amphetamine-dextroamphetamine (ADDERALL) 10 MG tablet      Receiving Psychotherapy: No    RECOMMENDATIONS:   Greater than 50% of 30 min face to face time with patient was spent on counseling and coordination of care. Discussed his reported good stability right now. He is happy with his medication. Having some mild weight gain with Seroquel,He does not wish to adjust or change medications at this time.  Also reviewed patient's high BP and HR this visit.  On review of charts, patient has presented with high BP on other visits.  Highly encouraged patient to follow-up with PCP to discuss his BP further.  Patient was advised to check blood pressure regularly and keep log.  Patient was encouraged to reach out to counselor or emergency resources if experiencing any future SI.   We agreed to:  Will continue Seroquel 50 mg in the morning, 50 mg in the afternoon, and 150 mg at bedtime. To continue Adderall 30 mg in the a.m., then 10 mg in the afternoon for boost. Will continue duloxetine 60 mg twice daily. Will continue Ativan 1 mg by mouth daily as needed only for severe anxiety. Will report side effects or worsening symptoms promptly Provided emergency contact information to include nearest ER, [facility], 911, and after-hours number, suicide hotline. ,Discussed potential benefits, risks, and side effects of stimulants with patient to include increased heart rate, palpitations, insomnia, increased anxiety, increased irritability, or decreased appetite.  Instructed patient to  contact office if experiencing any significant tolerability issues.  Discussed potential benefits, risk, and side effects of benzodiazepines to include potential risk of tolerance and dependence, as well as possible drowsiness.  Advised patient not to drive if experiencing drowsiness and to take lowest possible effective dose to minimize risk of dependence and tolerance.  Discussed potential metabolic side effects associated with atypical antipsychotics, as well as potential risk for movement side effects. Advised pt to contact office if movement side effects occur.   Reviewed PDMP    Joan Flores, NP

## 2023-06-09 ENCOUNTER — Telehealth: Payer: Self-pay | Admitting: Behavioral Health

## 2023-06-09 ENCOUNTER — Other Ambulatory Visit: Payer: Self-pay

## 2023-06-09 DIAGNOSIS — F902 Attention-deficit hyperactivity disorder, combined type: Secondary | ICD-10-CM

## 2023-06-09 MED ORDER — AMPHETAMINE-DEXTROAMPHETAMINE 10 MG PO TABS: ORAL_TABLET | ORAL | 0 refills | Status: DC

## 2023-06-09 MED ORDER — AMPHETAMINE-DEXTROAMPHETAMINE 30 MG PO TABS: ORAL_TABLET | ORAL | 0 refills | Status: DC

## 2023-06-09 NOTE — Telephone Encounter (Signed)
Pended.

## 2023-06-09 NOTE — Telephone Encounter (Signed)
Pt called asking for a refill on adderall 30 mg and adderall 10 mg. [facility] [address]. Next appt 9/11

## 2023-07-21 ENCOUNTER — Ambulatory Visit (INDEPENDENT_AMBULATORY_CARE_PROVIDER_SITE_OTHER): Payer: 59 | Admitting: Behavioral Health

## 2023-07-24 ENCOUNTER — Telehealth: Payer: Self-pay | Admitting: Behavioral Health

## 2023-07-24 ENCOUNTER — Other Ambulatory Visit: Payer: Self-pay

## 2023-07-24 DIAGNOSIS — F411 Generalized anxiety disorder: Secondary | ICD-10-CM

## 2023-07-24 DIAGNOSIS — F902 Attention-deficit hyperactivity disorder, combined type: Secondary | ICD-10-CM

## 2023-07-24 NOTE — Telephone Encounter (Signed)
Pended.

## 2023-07-24 NOTE — Telephone Encounter (Signed)
Aaron Burke called at 4:20 to request refill of his Adderall 10mg  and Adderall 30mg  and Lorazeoan 1mg .  WE cancelled his appt 9/27.  He will be back in town tomorrow and will call to RS that appt.  Send scripts [id number] - , [place] - [address]

## 2023-07-24 NOTE — Telephone Encounter (Signed)
LF both Adderall doses 8/16, LF lorazepam 7/1

## 2023-07-25 MED ORDER — AMPHETAMINE-DEXTROAMPHETAMINE 10 MG PO TABS: ORAL_TABLET | ORAL | 0 refills | Status: DC

## 2023-07-25 MED ORDER — LORAZEPAM 1 MG PO TABS: ORAL_TABLET | ORAL | 2 refills | Status: DC

## 2023-07-25 MED ORDER — AMPHETAMINE-DEXTROAMPHETAMINE 30 MG PO TABS: ORAL_TABLET | ORAL | 0 refills | Status: DC

## 2023-08-24 ENCOUNTER — Ambulatory Visit (INDEPENDENT_AMBULATORY_CARE_PROVIDER_SITE_OTHER): Payer: 59 | Admitting: Behavioral Health

## 2023-08-24 ENCOUNTER — Encounter: Payer: Self-pay | Admitting: Behavioral Health

## 2023-08-24 DIAGNOSIS — F4310 Post-traumatic stress disorder, unspecified: Secondary | ICD-10-CM

## 2023-08-24 DIAGNOSIS — F902 Attention-deficit hyperactivity disorder, combined type: Secondary | ICD-10-CM

## 2023-08-24 DIAGNOSIS — F331 Major depressive disorder, recurrent, moderate: Secondary | ICD-10-CM

## 2023-08-24 DIAGNOSIS — F411 Generalized anxiety disorder: Secondary | ICD-10-CM | POA: Diagnosis not present

## 2023-08-24 MED ORDER — QUETIAPINE FUMARATE 150 MG PO TABS: 150.0000 mg | ORAL_TABLET | Freq: Every day | ORAL | 1 refills | Status: DC

## 2023-08-24 MED ORDER — AMPHETAMINE-DEXTROAMPHETAMINE 20 MG PO TABS: 20.0000 mg | ORAL_TABLET | Freq: Every day | ORAL | 0 refills | Status: DC

## 2023-08-24 MED ORDER — BUPROPION HCL ER (XL) 150 MG PO TB24: 150.0000 mg | ORAL_TABLET | Freq: Every day | ORAL | 1 refills | Status: DC

## 2023-08-24 MED ORDER — AMPHETAMINE-DEXTROAMPHET ER 30 MG PO CP24: 30.0000 mg | ORAL_CAPSULE | Freq: Every day | ORAL | 0 refills | Status: DC

## 2023-08-24 MED ORDER — ALPRAZOLAM 0.5 MG PO TABS: 0.5000 mg | ORAL_TABLET | ORAL | 1 refills | Status: DC | PRN

## 2023-08-24 NOTE — Progress Notes (Signed)
[facility]  Patient ID: Aaron Burke,  MRN: [id number]  PCP: Clayborn Heron, MD  Date of Evaluation: 08/24/2023 Time spent:30 minutes  Chief Complaint:  Chief Complaint   Anxiety; Depression; Follow-up; ADHD; Patient Education; Medication Refill; Medication Problem     HISTORY/CURRENT STATUS: HPI  "Aaron Burke", 37 year old male presents to this for follow up and medication management.  Collateral information should be considered reliable.  Says divorce with his wife has been very stressful and has experienced some increase in depression and anxiety.  He is requesting adjustment or change  to his medication regimen. Says that he also is not getting enough coverage in the afternoons with his ADHD medication.  He states that he does not feel any current suicidal ideation.  Patient feels safe and he also verbally contracted for safety with this Clinical research associate.  He says that he has been using his Ativan very sparingly but he would like to try something with shorter half life.   He says that his depression is currently 6/10.  And anxiety is 4/10.  He is sleeping 6 to 7 hours per night.  He denies any current mania, no psychosis, no auditory or visual hallucinations.  He denies any current cannabis use and now reports very light EtOH use.  He has acquired an attorney to handle his divorce and custody issues over his son.  He was living in an extended stays but now says that he has acquired an apartment.   Prior psychiatric medications: Adderall Cymbalta Seroquel Ativan     Individual Medical History/ Review of Systems: Changes? :No   Allergies: Percocet [oxycodone-acetaminophen]  Current Medications:  Current Outpatient Medications:    ALPRAZolam (XANAX) 0.5 MG tablet, Take 1 tablet (0.5 mg total) by mouth as needed for anxiety., Disp: 15 tablet, Rfl: 1   amphetamine-dextroamphetamine (ADDERALL XR) 30 MG 24 hr capsule, Take 1 capsule (30 mg total) by mouth daily., Disp: 30  capsule, Rfl: 0   amphetamine-dextroamphetamine (ADDERALL) 20 MG tablet, Take 1 tablet (20 mg total) by mouth daily., Disp: 30 tablet, Rfl: 0   buPROPion (WELLBUTRIN XL) 150 MG 24 hr tablet, Take 1 tablet (150 mg total) by mouth daily., Disp: 30 tablet, Rfl: 1   amphetamine-dextroamphetamine (ADDERALL) 10 MG tablet, Take one tablet by mouth in the afternoon for boost, Disp: 30 tablet, Rfl: 0   amphetamine-dextroamphetamine (ADDERALL) 30 MG tablet, Take one tablet by mouth daily after breakfast, Disp: 30 tablet, Rfl: 0   Apremilast (OTEZLA) 30 MG TABS, Take 1 tablet (30 mg total) by mouth 2 (two) times daily., Disp: 60 tablet, Rfl: 0   DULoxetine (CYMBALTA) 60 MG capsule, Take 2 capsules (120 mg total) by mouth daily., Disp: 180 capsule, Rfl: 1   methocarbamol (ROBAXIN) 750 MG tablet, Take 1 tablet (750 mg total) by mouth 2 (two) times daily as needed for muscle spasms., Disp: 20 tablet, Rfl: 3   pantoprazole (PROTONIX) 40 MG tablet, Take 1 tablet (40 mg total) by mouth daily., Disp: 30 tablet, Rfl: 0   predniSONE (STERAPRED UNI-PAK 21 TAB) 10 MG (21) TBPK tablet, Take as directed, Disp: 21 tablet, Rfl: 3   QUEtiapine (SEROQUEL) 50 MG tablet, Take by mouth., Disp: , Rfl:    QUEtiapine Fumarate 150 MG TABS, Take 150 mg by mouth at bedtime., Disp: 90 tablet, Rfl: 1   Vitamin D, Ergocalciferol, (DRISDOL) 1.25 MG (50000 UNIT) CAPS capsule, Take 1 capsule (50,000 Units total) by mouth once a week., Disp: 5 capsule, Rfl: 0 Medication Side Effects:  none  Family Medical/ Social History: Changes? No  MENTAL HEALTH EXAM:  There were no vitals taken for this visit.There is no height or weight on file to calculate BMI.  General Appearance: Casual, Neat, and Well Groomed  Eye Contact:  Good  Speech:  Clear and Coherent  Volume:  Normal  Mood:  Anxious, Depressed, and Dysphoric  Affect:  Congruent, Depressed, and Anxious  Thought Process:  Coherent  Orientation:  Full (Time, Place, and Person)  Thought  Content: Logical   Suicidal Thoughts:  No  Homicidal Thoughts:  No  Memory:  WNL  Judgement:  Good  Insight:  Good  Psychomotor Activity:  Normal  Concentration:  Concentration: Good  Recall:  Good  Fund of Knowledge: Good  Language: Good  Assets:  Desire for Improvement  ADL's:  Intact  Cognition: WNL  Prognosis:  Good    DIAGNOSES:    ICD-10-CM   1. Attention deficit hyperactivity disorder (ADHD), combined type  F90.2 amphetamine-dextroamphetamine (ADDERALL XR) 30 MG 24 hr capsule    amphetamine-dextroamphetamine (ADDERALL) 20 MG tablet    2. Major depressive disorder, recurrent episode, moderate (HCC)  F33.1 buPROPion (WELLBUTRIN XL) 150 MG 24 hr tablet    QUEtiapine Fumarate 150 MG TABS    3. PTSD (post-traumatic stress disorder)  F43.10 ALPRAZolam (XANAX) 0.5 MG tablet    buPROPion (WELLBUTRIN XL) 150 MG 24 hr tablet    4. Generalized anxiety disorder  F41.1 ALPRAZolam (XANAX) 0.5 MG tablet    buPROPion (WELLBUTRIN XL) 150 MG 24 hr tablet    QUEtiapine Fumarate 150 MG TABS      Receiving Psychotherapy: No    RECOMMENDATIONS:   Greater than 50% of 30 min face to face time with patient was spent on counseling and coordination of care. Discussed his reported good stability right now. He is happy with his medication. Having some mild weight gain with Seroquel,He does not wish to adjust or change medications at this time.  Also reviewed patient's high BP and HR this visit.  On review of charts, patient has presented with high BP on other visits.  Highly encouraged patient to follow-up with PCP to discuss his BP further.  Patient was advised to check blood pressure regularly and keep log.  Patient was encouraged to reach out to counselor or emergency resources if experiencing any future SI.   We agreed to:  Will continue Seroquel 50 mg in the morning, 50 mg in the afternoon, and 150 mg at bedtime. To increase  Adderall to 30 mg ER mg in the a.m., then 20 mg in the afternoon  for boost. Will continue duloxetine 60 mg twice daily. Will switch to xanax 0.5 mg by mouth daily as needed only for severe anxiety. #15 tablets per month provided in RX Will report side effects or worsening symptoms promptly Provided emergency contact information to include nearest ER, [facility], 911, and after-hours number, suicide hotline. ,Discussed potential benefits, risks, and side effects of stimulants with patient to include increased heart rate, palpitations, insomnia, increased anxiety, increased irritability, or decreased appetite.  Instructed patient to contact office if experiencing any significant tolerability issues.  Discussed potential benefits, risk, and side effects of benzodiazepines to include potential risk of tolerance and dependence, as well as possible drowsiness.  Advised patient not to drive if experiencing drowsiness and to take lowest possible effective dose to minimize risk of dependence and tolerance.  Discussed potential metabolic side effects associated with atypical antipsychotics, as well as potential risk for movement  side effects. Advised pt to contact office if movement side effects occur.   Reviewed PDMP         Joan Flores, NP

## 2023-09-07 ENCOUNTER — Telehealth: Payer: Self-pay | Admitting: Behavioral Health

## 2023-09-07 NOTE — Telephone Encounter (Signed)
Aaron Burke called to request a refill of his [name]. He said you are prescribing his medications now.  I told him that this is not a psych med so was not sure if he would take over this prescription.  He has run out and needs a refill.  [name] it has to go to the [facility] building [facility].  Appt 12/5

## 2023-09-07 NOTE — Telephone Encounter (Signed)
Notified patient we do not prescribe this medication.

## 2023-09-15 ENCOUNTER — Other Ambulatory Visit: Payer: Self-pay | Admitting: Behavioral Health

## 2023-09-15 DIAGNOSIS — F331 Major depressive disorder, recurrent, moderate: Secondary | ICD-10-CM

## 2023-09-15 DIAGNOSIS — F4310 Post-traumatic stress disorder, unspecified: Secondary | ICD-10-CM

## 2023-09-15 DIAGNOSIS — F411 Generalized anxiety disorder: Secondary | ICD-10-CM

## 2023-09-26 ENCOUNTER — Other Ambulatory Visit: Payer: Self-pay

## 2023-09-26 ENCOUNTER — Telehealth: Payer: Self-pay | Admitting: Behavioral Health

## 2023-09-26 DIAGNOSIS — F902 Attention-deficit hyperactivity disorder, combined type: Secondary | ICD-10-CM

## 2023-09-26 NOTE — Telephone Encounter (Signed)
He asks for 10 mg Adderall IR, but last note says increased to 20 mg. Verify what dose he is wanting.

## 2023-09-26 NOTE — Telephone Encounter (Signed)
PT lvm that he needs refills on his addderall xr 30 mg, adderall 10 mg. He also needs wellbutrin xl 150 mg. He also needs his xanax filled. [facility] [address] Next appt 12/5

## 2023-09-26 NOTE — Telephone Encounter (Addendum)
Has RF on alprazolam and Wellbutrin. Will pend both Adderall doses.   08/24/2023 08/24/2023 1  Dextroamp-Amphet Er 30 Mg Cap 30.00 30 Br Whi [id number] Nor (0391) 0/0  Comm Ins [place] 08/24/2023 08/24/2023 1  Dextroamp-Amphetamin 20 Mg Tab 30.00 30 Br Whi [id number] Nor (0391) 0/0  Comm Ins [place] 08/24/2023 08/24/2023 1  Alprazolam 0.5 Mg Tablet

## 2023-09-27 NOTE — Telephone Encounter (Signed)
LVM to RC 

## 2023-09-28 ENCOUNTER — Telehealth: Payer: Self-pay | Admitting: Behavioral Health

## 2023-09-28 ENCOUNTER — Other Ambulatory Visit: Payer: Self-pay | Admitting: Behavioral Health

## 2023-09-28 ENCOUNTER — Encounter: Payer: Self-pay | Admitting: Behavioral Health

## 2023-09-28 ENCOUNTER — Telehealth: Payer: 59 | Admitting: Behavioral Health

## 2023-09-28 DIAGNOSIS — F411 Generalized anxiety disorder: Secondary | ICD-10-CM

## 2023-09-28 DIAGNOSIS — F331 Major depressive disorder, recurrent, moderate: Secondary | ICD-10-CM

## 2023-09-28 DIAGNOSIS — F4310 Post-traumatic stress disorder, unspecified: Secondary | ICD-10-CM

## 2023-09-28 DIAGNOSIS — F902 Attention-deficit hyperactivity disorder, combined type: Secondary | ICD-10-CM

## 2023-09-28 MED ORDER — AMPHETAMINE-DEXTROAMPHETAMINE 20 MG PO TABS: 20.0000 mg | ORAL_TABLET | Freq: Every day | ORAL | 0 refills | Status: DC

## 2023-09-28 MED ORDER — QUETIAPINE FUMARATE 50 MG PO TABS: 50.0000 mg | ORAL_TABLET | Freq: Two times a day (BID) | ORAL | 1 refills | Status: DC

## 2023-09-28 MED ORDER — ALPRAZOLAM 0.5 MG PO TABS: 0.5000 mg | ORAL_TABLET | ORAL | 1 refills | Status: DC | PRN

## 2023-09-28 MED ORDER — BUPROPION HCL ER (XL) 150 MG PO TB24: 150.0000 mg | ORAL_TABLET | Freq: Every day | ORAL | 1 refills | Status: DC

## 2023-09-28 MED ORDER — QUETIAPINE FUMARATE 150 MG PO TABS: 150.0000 mg | ORAL_TABLET | Freq: Every day | ORAL | 1 refills | Status: DC

## 2023-09-28 MED ORDER — ALPRAZOLAM 0.5 MG PO TABS: ORAL_TABLET | ORAL | 1 refills | Status: DC

## 2023-09-28 MED ORDER — AMPHETAMINE-DEXTROAMPHET ER 30 MG PO CP24: 30.0000 mg | ORAL_CAPSULE | Freq: Every day | ORAL | 0 refills | Status: DC

## 2023-09-28 MED ORDER — DULOXETINE HCL 60 MG PO CPEP: 120.0000 mg | ORAL_CAPSULE | Freq: Every day | ORAL | 1 refills | Status: DC

## 2023-09-28 NOTE — Telephone Encounter (Signed)
Pharmacy called and asked that we clarify the max quantity per day on the Xanax RX. Resend with this info.

## 2023-09-28 NOTE — Progress Notes (Signed)
[facility]  Patient ID: Aaron Burke,  MRN: [id number]  PCP: Aaron Heron, MD  Date of Evaluation: 09/28/2023 Time spent:30 minutes  Chief Complaint:   HISTORY/CURRENT STATUS: HPI "Aaron Burke", 37 year old male presents to this for follow up and medication management.  Collateral information should be considered reliable.  Says he is doing ok with anxiety and depression.  Very happy with his meds right now.   He says that he has been using his Ativan very sparingly.  He says that his depression is currently 3/10.  And anxiety is 4/10.  He is sleeping 6 to 7 hours per night.  He denies any current mania, no psychosis, no auditory or visual hallucinations.  He denies any current cannabis use and now reports very light EtOH use. Still in legal battle with spouse related to divorce.  He was living in an extended stays but now says that he has acquired an apartment.   Prior psychiatric medications: Adderall Cymbalta Seroquel Ativan    Individual Medical History/ Review of Systems: Changes? :No   Allergies: Percocet [oxycodone-acetaminophen]  Current Medications:  Current Outpatient Medications:    ALPRAZolam (XANAX) 0.5 MG tablet, Take 1 tablet (0.5 mg total) by mouth as needed for anxiety., Disp: 20 tablet, Rfl: 1   amphetamine-dextroamphetamine (ADDERALL XR) 30 MG 24 hr capsule, Take 1 capsule (30 mg total) by mouth daily., Disp: 30 capsule, Rfl: 0   amphetamine-dextroamphetamine (ADDERALL) 20 MG tablet, Take 1 tablet (20 mg total) by mouth daily., Disp: 30 tablet, Rfl: 0   amphetamine-dextroamphetamine (ADDERALL) 30 MG tablet, Take one tablet by mouth daily after breakfast, Disp: 30 tablet, Rfl: 0   Apremilast (OTEZLA) 30 MG TABS, Take 1 tablet (30 mg total) by mouth 2 (two) times daily., Disp: 60 tablet, Rfl: 0   buPROPion (WELLBUTRIN XL) 150 MG 24 hr tablet, Take 1 tablet (150 mg total) by mouth daily., Disp: 90 tablet, Rfl: 1   DULoxetine (CYMBALTA) 60 MG  capsule, Take 2 capsules (120 mg total) by mouth daily., Disp: 180 capsule, Rfl: 1   methocarbamol (ROBAXIN) 750 MG tablet, Take 1 tablet (750 mg total) by mouth 2 (two) times daily as needed for muscle spasms., Disp: 20 tablet, Rfl: 3   pantoprazole (PROTONIX) 40 MG tablet, Take 1 tablet (40 mg total) by mouth daily., Disp: 30 tablet, Rfl: 0   predniSONE (STERAPRED UNI-PAK 21 TAB) 10 MG (21) TBPK tablet, Take as directed, Disp: 21 tablet, Rfl: 3   QUEtiapine (SEROQUEL) 50 MG tablet, Take 1 tablet (50 mg total) by mouth 2 (two) times daily., Disp: 180 tablet, Rfl: 1   QUEtiapine Fumarate 150 MG TABS, Take 150 mg by mouth at bedtime., Disp: 90 tablet, Rfl: 1   Vitamin D, Ergocalciferol, (DRISDOL) 1.25 MG (50000 UNIT) CAPS capsule, Take 1 capsule (50,000 Units total) by mouth once a week., Disp: 5 capsule, Rfl: 0 Medication Side Effects: none  Family Medical/ Social History: Changes? No  MENTAL HEALTH EXAM:  There were no vitals taken for this visit.There is no height or weight on file to calculate BMI.  General Appearance: Casual, Neat, and Well Groomed  Eye Contact:  Good  Speech:  Clear and Coherent  Volume:  Normal  Mood:  NA  Affect:  Appropriate  Thought Process:  Coherent  Orientation:  Full (Time, Place, and Person)  Thought Content: Logical   Suicidal Thoughts:  No  Homicidal Thoughts:  No  Memory:  WNL  Judgement:  Good  Insight:  Good  Psychomotor  Activity:  Normal  Concentration:  Concentration: Good  Recall:  Good  Fund of Knowledge: Good  Language: Good  Assets:  Desire for Improvement  ADL's:  Intact  Cognition: WNL  Prognosis:  Good    DIAGNOSES:    ICD-10-CM   1. Attention deficit hyperactivity disorder (ADHD), combined type  F90.2 amphetamine-dextroamphetamine (ADDERALL XR) 30 MG 24 hr capsule    amphetamine-dextroamphetamine (ADDERALL) 20 MG tablet    2. PTSD (post-traumatic stress disorder)  F43.10 ALPRAZolam (XANAX) 0.5 MG tablet    buPROPion  (WELLBUTRIN XL) 150 MG 24 hr tablet    DULoxetine (CYMBALTA) 60 MG capsule    3. Generalized anxiety disorder  F41.1 ALPRAZolam (XANAX) 0.5 MG tablet    buPROPion (WELLBUTRIN XL) 150 MG 24 hr tablet    QUEtiapine Fumarate 150 MG TABS    DULoxetine (CYMBALTA) 60 MG capsule    4. Major depressive disorder, recurrent episode, moderate (HCC)  F33.1 buPROPion (WELLBUTRIN XL) 150 MG 24 hr tablet    QUEtiapine Fumarate 150 MG TABS    DULoxetine (CYMBALTA) 60 MG capsule      Receiving Psychotherapy: No    RECOMMENDATIONS:    Greater than 50% of 30 min face to face time with patient was spent on counseling and coordination of care. Discussed his reported good stability right now. He is happy with his medication. He does not wish to adjust or change medications at this time.  Also reviewed patient's high BP and HR this visit.  Reinforced with  pt that he is advised to check blood pressure regularly and keep log.  Patient was encouraged to reach out to counselor or emergency resources if experiencing any future SI.   We agreed to:  Will continue Seroquel 50 mg in the morning, 50 mg in the afternoon, and 150 mg at bedtime. To continue Adderall to 30 mg ER mg in the a.m., then 20 mg in the afternoon for boost. Will continue duloxetine 60 mg twice daily. Will switch to xanax 0.5 mg by mouth daily as needed only for severe anxiety. #15 tablets per month provided in RX Will report side effects or worsening symptoms promptly Provided emergency contact information to include nearest ER, [facility], 911, and after-hours number, suicide hotline. ,Discussed potential benefits, risks, and side effects of stimulants with patient to include increased heart rate, palpitations, insomnia, increased anxiety, increased irritability, or decreased appetite.  Instructed patient to contact office if experiencing any significant tolerability issues.  Discussed potential benefits, risk, and side effects of benzodiazepines to  include potential risk of tolerance and dependence, as well as possible drowsiness.  Advised patient not to drive if experiencing drowsiness and to take lowest possible effective dose to minimize risk of dependence and tolerance.  Discussed potential metabolic side effects associated with atypical antipsychotics, as well as potential risk for movement side effects. Advised pt to contact office if movement side effects occur.   Reviewed PDMP     Joan Flores, NP

## 2023-09-28 NOTE — Telephone Encounter (Signed)
Please see message from pharmacy.

## 2023-09-28 NOTE — Telephone Encounter (Signed)
Had appt today and scripts sent.

## 2023-09-28 NOTE — Telephone Encounter (Signed)
Left second VM to RC.  

## 2023-09-28 NOTE — Addendum Note (Signed)
Addended by: Avelina Laine A on: 09/28/2023 03:56 PM   Modules accepted: Orders

## 2023-11-07 ENCOUNTER — Other Ambulatory Visit: Payer: Self-pay

## 2023-11-07 ENCOUNTER — Telehealth: Payer: Self-pay | Admitting: Behavioral Health

## 2023-11-07 DIAGNOSIS — F902 Attention-deficit hyperactivity disorder, combined type: Secondary | ICD-10-CM

## 2023-11-07 NOTE — Telephone Encounter (Signed)
 Pended Adderall 30 XR and 20 IR to [facility] on [address].

## 2023-11-07 NOTE — Telephone Encounter (Signed)
 Pt called requesting a refill on his adderall xr 30 mg and adderall 20 mg. Pharmacy is [facility] on [address]

## 2023-11-08 MED ORDER — AMPHETAMINE-DEXTROAMPHET ER 30 MG PO CP24: 30.0000 mg | ORAL_CAPSULE | Freq: Every day | ORAL | 0 refills | Status: DC

## 2023-11-08 MED ORDER — AMPHETAMINE-DEXTROAMPHETAMINE 20 MG PO TABS: 20.0000 mg | ORAL_TABLET | Freq: Every day | ORAL | 0 refills | Status: DC

## 2023-12-18 ENCOUNTER — Other Ambulatory Visit: Payer: Self-pay

## 2023-12-18 ENCOUNTER — Telehealth: Payer: Self-pay | Admitting: Behavioral Health

## 2023-12-18 DIAGNOSIS — F902 Attention-deficit hyperactivity disorder, combined type: Secondary | ICD-10-CM

## 2023-12-18 DIAGNOSIS — F411 Generalized anxiety disorder: Secondary | ICD-10-CM

## 2023-12-18 DIAGNOSIS — F4310 Post-traumatic stress disorder, unspecified: Secondary | ICD-10-CM

## 2023-12-18 MED ORDER — AMPHETAMINE-DEXTROAMPHET ER 30 MG PO CP24: 30.0000 mg | ORAL_CAPSULE | Freq: Every day | ORAL | 0 refills | Status: DC

## 2023-12-18 MED ORDER — ALPRAZOLAM 0.5 MG PO TABS: ORAL_TABLET | ORAL | 1 refills | Status: DC

## 2023-12-18 MED ORDER — AMPHETAMINE-DEXTROAMPHETAMINE 20 MG PO TABS: 20.0000 mg | ORAL_TABLET | Freq: Every day | ORAL | 0 refills | Status: DC

## 2023-12-18 NOTE — Telephone Encounter (Signed)
 Aaron Burke called upset that he has not gotten his refills filled for his medications. They are Xanax, Adderall, Duloxetine and Risperidone.   Please call to:  [facility] [phone] - [place], [place] - [address]   Phone: [phone]  Fax: [phone]

## 2023-12-18 NOTE — Telephone Encounter (Signed)
 Called patient to let him know that both doses of Adderall and Xanax had been pended to [place]. He said he had called last Thursday for RF, but there is no documentation of this. He has duloxetine available. It is not noted that patient is on risperidone, but he said he is. He said he takes for sleep. I asked him if it was Seroquel and he said no. He has a telehealth appt with Arlys John tomorrow and will discuss it with him then.

## 2023-12-18 NOTE — Telephone Encounter (Signed)
 Pended RF on both Adderall doses, 30 XR and 20 IR; alprazolam. Has RF on duloxetine, is not on risperidone.

## 2023-12-18 NOTE — Telephone Encounter (Signed)
 Pt lvm asking for a refill on his addderall xr 30 mg  and adderall 20 mg. Pharmacy is [facility] [address]

## 2023-12-19 ENCOUNTER — Encounter: Payer: Self-pay | Admitting: Behavioral Health

## 2023-12-19 ENCOUNTER — Telehealth (INDEPENDENT_AMBULATORY_CARE_PROVIDER_SITE_OTHER): Payer: 59 | Admitting: Behavioral Health

## 2023-12-19 DIAGNOSIS — F411 Generalized anxiety disorder: Secondary | ICD-10-CM

## 2023-12-19 DIAGNOSIS — F4310 Post-traumatic stress disorder, unspecified: Secondary | ICD-10-CM | POA: Diagnosis not present

## 2023-12-19 DIAGNOSIS — F902 Attention-deficit hyperactivity disorder, combined type: Secondary | ICD-10-CM | POA: Diagnosis not present

## 2023-12-19 DIAGNOSIS — F331 Major depressive disorder, recurrent, moderate: Secondary | ICD-10-CM

## 2023-12-19 DIAGNOSIS — F5105 Insomnia due to other mental disorder: Secondary | ICD-10-CM

## 2023-12-19 DIAGNOSIS — F99 Mental disorder, not otherwise specified: Secondary | ICD-10-CM

## 2023-12-19 MED ORDER — DULOXETINE HCL 60 MG PO CPEP: 120.0000 mg | ORAL_CAPSULE | Freq: Every day | ORAL | 1 refills | Status: DC

## 2023-12-19 MED ORDER — ALPRAZOLAM 0.5 MG PO TABS: ORAL_TABLET | ORAL | 1 refills | Status: DC

## 2023-12-19 MED ORDER — BUPROPION HCL ER (XL) 150 MG PO TB24: 150.0000 mg | ORAL_TABLET | Freq: Every day | ORAL | 1 refills | Status: DC

## 2023-12-19 MED ORDER — QUETIAPINE FUMARATE 150 MG PO TABS
150.0000 mg | ORAL_TABLET | Freq: Every day | ORAL | 1 refills | Status: DC
Start: 2023-12-19 — End: 2024-08-16

## 2023-12-19 MED ORDER — QUETIAPINE FUMARATE 50 MG PO TABS: 50.0000 mg | ORAL_TABLET | Freq: Two times a day (BID) | ORAL | 1 refills | Status: DC

## 2023-12-19 NOTE — Progress Notes (Signed)
 Aaron Burke [id number] 1985-12-19 38 y.o.  Virtual Visit via Video Note  I connected with pt @ on 12/19/23 at  9:30 AM EST by a video enabled telemedicine application and verified that I am speaking with the correct person using two identifiers.   I discussed the limitations of evaluation and management by telemedicine and the availability of in person appointments. The patient expressed understanding and agreed to proceed.  I discussed the assessment and treatment plan with the patient. The patient was provided an opportunity to ask questions and all were answered. The patient agreed with the plan and demonstrated an understanding of the instructions.   The patient was advised to call back or seek an in-person evaluation if the symptoms worsen or if the condition fails to improve as anticipated.  I provided 20 minutes of non-face-to-face time during this encounter.  The patient was located at home.  The provider was located at [facility].   Joan Flores, NP   Subjective:   Patient ID:  Aaron Burke is a 38 y.o. (DOB May 30, 1986) male.  Chief Complaint: No chief complaint on file.   HPI "Aaron Burke", 38 year old male presents to this for follow up and medication management.  Collateral information should be considered reliable.  He is upset because he says he has been without his medications for two weeks. Says he has left messages. We had no record of messages. Says he just needs refills today.  He states that he does not feel any current suicidal ideation.  Patient feels safe and he also verbally contracted for safety with this Clinical research associate.  He says that he has been using his Ativan very sparingly but he would like to try something with shorter half life.   He says that his depression is currently 5/10.  And anxiety is 4/10.  He is sleeping 6 to 7 hours per night.  He denies any current mania, no psychosis, no auditory or visual hallucinations.  He denies any current cannabis use  and still reports very light EtOH use.  No current mania, no psychosis, no auditory or visual hallucinations. No current SI or HI.     Prior psychiatric medications: Adderall Cymbalta Seroquel Ativan   Review of Systems:  Review of Systems  Constitutional: Negative.   Allergic/Immunologic: Negative.   Psychiatric/Behavioral: Negative.      Medications: I have reviewed the patient's current medications.  Current Outpatient Medications  Medication Sig Dispense Refill   ALPRAZolam (XANAX) 0.5 MG tablet Take one tablet by mouth daily as needed only for severe anxiety 20 tablet 1   amphetamine-dextroamphetamine (ADDERALL XR) 30 MG 24 hr capsule Take 1 capsule (30 mg total) by mouth daily. 30 capsule 0   amphetamine-dextroamphetamine (ADDERALL) 20 MG tablet Take 1 tablet (20 mg total) by mouth daily. 30 tablet 0   amphetamine-dextroamphetamine (ADDERALL) 30 MG tablet Take one tablet by mouth daily after breakfast 30 tablet 0   Apremilast (OTEZLA) 30 MG TABS Take 1 tablet (30 mg total) by mouth 2 (two) times daily. 60 tablet 0   buPROPion (WELLBUTRIN XL) 150 MG 24 hr tablet Take 1 tablet (150 mg total) by mouth daily. 90 tablet 1   DULoxetine (CYMBALTA) 60 MG capsule Take 2 capsules (120 mg total) by mouth daily. 180 capsule 1   methocarbamol (ROBAXIN) 750 MG tablet Take 1 tablet (750 mg total) by mouth 2 (two) times daily as needed for muscle spasms. 20 tablet 3   pantoprazole (PROTONIX) 40 MG tablet Take 1 tablet (40  mg total) by mouth daily. 30 tablet 0   predniSONE (STERAPRED UNI-PAK 21 TAB) 10 MG (21) TBPK tablet Take as directed 21 tablet 3   QUEtiapine (SEROQUEL) 50 MG tablet Take 1 tablet (50 mg total) by mouth 2 (two) times daily. 180 tablet 1   QUEtiapine Fumarate 150 MG TABS Take 150 mg by mouth at bedtime. 90 tablet 1   Vitamin D, Ergocalciferol, (DRISDOL) 1.25 MG (50000 UNIT) CAPS capsule Take 1 capsule (50,000 Units total) by mouth once a week. 5 capsule 0   No current  facility-administered medications for this visit.    Medication Side Effects: None  Allergies:  Allergies  Allergen Reactions   Percocet [Oxycodone-Acetaminophen] Other (See Comments)    "makes me feel uncomfortable"    Past Medical History:  Diagnosis Date   Gallstone pancreatitis 2020   GERD (gastroesophageal reflux disease)    Plaque psoriasis    Psoriatic arthritis (HCC)    Testicular torsion 2020    Family History  Problem Relation Age of Onset   Hyperlipidemia Mother    Heart attack Father        x 2, total 4 stents   Bipolar disorder Paternal Uncle    Bipolar disorder Maternal Grandmother    Heart attack Paternal Grandfather    Bipolar disorder Other     Social History   Socioeconomic History   Marital status: Legally Separated    Spouse name: Not on file   Number of children: 1   Years of education: 16   Highest education level: Bachelor's degree (e.g., BA, AB, BS)  Occupational History   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  Enjoys golf, hunting, surfing, outdoor sports.    Social Drivers of Corporate investment banker Strain: Not on file  Food Insecurity: No Food Insecurity (12/13/2022)   Hunger Vital Sign    Worried About Running Out of Food in the Last Year: Never true    Ran Out of Food in the Last Year: Never true  Transportation Needs: Patient Declined (12/13/2022)   PRAPARE - Administrator, Civil Service (Medical): Patient declined    Lack of Transportation  (Non-Medical): Patient declined  Physical Activity: Not on file  Stress: Not on file  Social Connections: Unknown (11/30/2022)   Received from [facility]   Social Network    Social Network: Not on file  Intimate Partner Violence: Patient Declined (12/13/2022)   Humiliation, Afraid, Rape, and Kick questionnaire    Fear of Current or Ex-Partner: Patient declined    Emotionally Abused: Patient declined    Physically Abused: Patient declined    Sexually Abused: Patient declined    Past Medical History, Surgical history, Social history, and Family history were reviewed and updated as appropriate.   Please see review of systems for further details on the patient's review from today.   Objective:   Physical Exam:  There were no vitals taken for this visit.  Physical Exam Constitutional:      General: He is not in acute distress.    Appearance: Normal appearance.  Neurological:     Mental Status: He is alert and oriented to person, place, and time.     Gait: Gait normal.  Psychiatric:        Attention and Perception: Attention and perception normal. He does not perceive auditory or visual hallucinations.        Mood and Affect: Mood and affect normal. Mood is not anxious or depressed. Affect is not labile.        Speech: Speech normal.        Behavior: Behavior normal. Behavior is cooperative.        Thought Content: Thought content normal.        Cognition and Memory: Cognition and memory normal.        Judgment: Judgment normal.     Lab Review:     Component Value Date/Time   NA 139 12/11/2022 1753   K 3.8 12/11/2022 1753   CL 101 12/11/2022 1753   CO2 24 12/11/2022 1753   GLUCOSE 161 (H) 12/11/2022 1753   BUN 7 12/11/2022 1753   CREATININE 0.83 12/11/2022 1753   CALCIUM 9.2 12/11/2022 1753   PROT 7.3 12/18/2022 1815   PROT 7.4 10/30/2020 1636   ALBUMIN 4.0 12/18/2022 1815   ALBUMIN 4.7 10/30/2020 1636   AST 42 (H) 12/18/2022 1815   ALT 78 (H)  12/18/2022 1815   ALKPHOS 78 12/18/2022 1815   BILITOT 0.3 12/18/2022 1815   BILITOT 0.5 10/30/2020 1636   GFRNONAA >60 12/11/2022 1753       Component Value Date/Time   WBC 7.5 12/11/2022 1753   RBC 5.91 (H) 12/11/2022 1753   HGB 17.1 (H) 12/11/2022 1753   HCT 51.6 12/11/2022 1753   PLT 219 12/11/2022 1753   MCV 87.3 12/11/2022 1753   MCH 28.9 12/11/2022 1753   MCHC 33.1 12/11/2022 1753   RDW 13.4 12/11/2022 1753   LYMPHSABS 2.5 12/11/2022 1753   MONOABS 0.3 12/11/2022 1753   EOSABS 0.2 12/11/2022 1753   BASOSABS 0.1 12/11/2022 1753    No results found for: "POCLITH", "LITHIUM"   No results found for: "PHENYTOIN", "PHENOBARB", "VALPROATE", "CBMZ"   .res Assessment: Plan:    Greater than 50% of 30 min face to face time with patient was spent on counseling and coordination of care. Discussed his reported good stability right now when he is taking his medication. Counseled pt on making sure he provide full name and DOB if leaving messages for staff, or getting live person if he has medication issues. He is happy with his medication. Having some mild weight gain with Seroquel,He does not wish to adjust or change medications at this time.  Just needs refills.   Pt says he has been monitoring BP regularly and it has be ok. Patient was encouraged to reach out to counselor or emergency resources if experiencing any future SI.   We agreed to:  Will continue Seroquel 50 mg in the morning, 50 mg in the afternoon, and 150 mg at bedtime. To increase  Adderall to 30 mg ER mg in the a.m., then 20 mg in the afternoon for boost. Will continue duloxetine 60 mg twice daily. Will switch to xanax 0.5 mg by mouth daily as needed only for severe anxiety. #20 tablets per month provided in RX Will report side effects or worsening symptoms promptly Provided emergency contact information to include nearest ER,  [facility], 911, and after-hours number, suicide hotline. ,Discussed potential benefits, risks, and  side effects of stimulants with patient to include increased heart rate, palpitations, insomnia, increased anxiety, increased irritability, or decreased appetite.  Instructed patient to contact office if experiencing any significant tolerability issues.  Discussed potential benefits, risk, and side effects of benzodiazepines to include potential risk of tolerance and dependence, as well as possible drowsiness.  Advised patient not to drive if experiencing drowsiness and to take lowest possible effective dose to minimize risk of dependence and tolerance.  Discussed potential metabolic side effects associated with atypical antipsychotics, as well as potential risk for movement side effects. Advised pt to contact office if movement side effects occur.   Reviewed PDMP      Watt Climes. Jelissa Espiritu, NP  Diagnoses and all orders for this visit:  Generalized anxiety disorder -     buPROPion (WELLBUTRIN XL) 150 MG 24 hr tablet; Take 1 tablet (150 mg total) by mouth daily. -     DULoxetine (CYMBALTA) 60 MG capsule; Take 2 capsules (120 mg total) by mouth daily. -     QUEtiapine Fumarate 150 MG TABS; Take 150 mg by mouth at bedtime. -     ALPRAZolam (XANAX) 0.5 MG tablet; Take one tablet by mouth daily as needed only for severe anxiety  PTSD (post-traumatic stress disorder) -     buPROPion (WELLBUTRIN XL) 150 MG 24 hr tablet; Take 1 tablet (150 mg total) by mouth daily. -     DULoxetine (CYMBALTA) 60 MG capsule; Take 2 capsules (120 mg total) by mouth daily. -     ALPRAZolam (XANAX) 0.5 MG tablet; Take one tablet by mouth daily as needed only for severe anxiety  Major depressive disorder, recurrent episode, moderate (HCC) -     buPROPion (WELLBUTRIN XL) 150 MG 24 hr tablet; Take 1 tablet (150 mg total) by mouth daily. -     DULoxetine (CYMBALTA) 60 MG capsule; Take 2 capsules (120 mg total) by mouth daily. -     QUEtiapine Fumarate 150 MG TABS; Take 150 mg by mouth at bedtime.  Attention deficit hyperactivity  disorder (ADHD), combined type  Insomnia due to other mental disorder -     QUEtiapine (SEROQUEL) 50 MG tablet; Take 1 tablet (50 mg total) by mouth 2 (two) times daily.     Please see After Visit Summary for patient specific instructions.  No future appointments.  No orders of the defined types were placed in this encounter.     -------------------------------

## 2024-08-15 ENCOUNTER — Inpatient Hospital Stay (HOSPITAL_COMMUNITY)
Admission: EM | Admit: 2024-08-15 | Discharge: 2024-08-20 | DRG: 438 | Disposition: A | Discharge status: Home / Self Care | Payer: MEDICAID

## 2024-08-15 ENCOUNTER — Other Ambulatory Visit: Payer: Self-pay

## 2024-08-15 ENCOUNTER — Emergency Department (HOSPITAL_COMMUNITY): Payer: MEDICAID

## 2024-08-15 ENCOUNTER — Encounter (HOSPITAL_COMMUNITY): Payer: Self-pay

## 2024-08-15 ENCOUNTER — Inpatient Hospital Stay (HOSPITAL_COMMUNITY): Payer: MEDICAID

## 2024-08-15 DIAGNOSIS — F419 Anxiety disorder, unspecified: Secondary | ICD-10-CM | POA: Diagnosis present

## 2024-08-15 DIAGNOSIS — E860 Dehydration: Secondary | ICD-10-CM | POA: Diagnosis present

## 2024-08-15 DIAGNOSIS — K219 Gastro-esophageal reflux disease without esophagitis: Secondary | ICD-10-CM | POA: Diagnosis present

## 2024-08-15 DIAGNOSIS — G8929 Other chronic pain: Secondary | ICD-10-CM | POA: Diagnosis present

## 2024-08-15 DIAGNOSIS — T730XXA Starvation, initial encounter: Secondary | ICD-10-CM | POA: Diagnosis present

## 2024-08-15 DIAGNOSIS — K2921 Alcoholic gastritis with bleeding: Secondary | ICD-10-CM | POA: Diagnosis present

## 2024-08-15 DIAGNOSIS — E8729 Other acidosis: Secondary | ICD-10-CM | POA: Diagnosis present

## 2024-08-15 DIAGNOSIS — Z66 Do not resuscitate: Secondary | ICD-10-CM | POA: Diagnosis present

## 2024-08-15 DIAGNOSIS — K852 Alcohol induced acute pancreatitis without necrosis or infection: Principal | ICD-10-CM | POA: Diagnosis present

## 2024-08-15 DIAGNOSIS — E1165 Type 2 diabetes mellitus with hyperglycemia: Secondary | ICD-10-CM | POA: Diagnosis present

## 2024-08-15 DIAGNOSIS — K861 Other chronic pancreatitis: Secondary | ICD-10-CM | POA: Insufficient documentation

## 2024-08-15 DIAGNOSIS — R109 Unspecified abdominal pain: Secondary | ICD-10-CM | POA: Insufficient documentation

## 2024-08-15 DIAGNOSIS — F1090 Alcohol use, unspecified, uncomplicated: Secondary | ICD-10-CM | POA: Diagnosis not present

## 2024-08-15 DIAGNOSIS — M545 Low back pain, unspecified: Secondary | ICD-10-CM | POA: Insufficient documentation

## 2024-08-15 DIAGNOSIS — R739 Hyperglycemia, unspecified: Secondary | ICD-10-CM | POA: Diagnosis present

## 2024-08-15 DIAGNOSIS — K8582 Other acute pancreatitis with infected necrosis: Principal | ICD-10-CM

## 2024-08-15 DIAGNOSIS — Z87891 Personal history of nicotine dependence: Secondary | ICD-10-CM | POA: Diagnosis not present

## 2024-08-15 DIAGNOSIS — K859 Acute pancreatitis without necrosis or infection, unspecified: Secondary | ICD-10-CM | POA: Diagnosis present

## 2024-08-15 DIAGNOSIS — E119 Type 2 diabetes mellitus without complications: Secondary | ICD-10-CM

## 2024-08-15 DIAGNOSIS — A419 Sepsis, unspecified organism: Secondary | ICD-10-CM

## 2024-08-15 DIAGNOSIS — R1013 Epigastric pain: Secondary | ICD-10-CM | POA: Diagnosis not present

## 2024-08-15 DIAGNOSIS — E876 Hypokalemia: Secondary | ICD-10-CM | POA: Diagnosis present

## 2024-08-15 DIAGNOSIS — D649 Anemia, unspecified: Secondary | ICD-10-CM | POA: Diagnosis not present

## 2024-08-15 DIAGNOSIS — K8689 Other specified diseases of pancreas: Secondary | ICD-10-CM | POA: Diagnosis not present

## 2024-08-15 DIAGNOSIS — H532 Diplopia: Secondary | ICD-10-CM | POA: Diagnosis present

## 2024-08-15 DIAGNOSIS — F4310 Post-traumatic stress disorder, unspecified: Secondary | ICD-10-CM | POA: Diagnosis present

## 2024-08-15 DIAGNOSIS — K921 Melena: Secondary | ICD-10-CM | POA: Insufficient documentation

## 2024-08-15 DIAGNOSIS — F10139 Alcohol abuse with withdrawal, unspecified: Secondary | ICD-10-CM | POA: Diagnosis present

## 2024-08-15 DIAGNOSIS — K760 Fatty (change of) liver, not elsewhere classified: Secondary | ICD-10-CM | POA: Diagnosis present

## 2024-08-15 DIAGNOSIS — K863 Pseudocyst of pancreas: Secondary | ICD-10-CM | POA: Diagnosis not present

## 2024-08-15 DIAGNOSIS — Y908 Blood alcohol level of 240 mg/100 ml or more: Secondary | ICD-10-CM | POA: Diagnosis present

## 2024-08-15 DIAGNOSIS — E785 Hyperlipidemia, unspecified: Secondary | ICD-10-CM | POA: Diagnosis present

## 2024-08-15 DIAGNOSIS — E878 Other disorders of electrolyte and fluid balance, not elsewhere classified: Secondary | ICD-10-CM | POA: Insufficient documentation

## 2024-08-15 DIAGNOSIS — R748 Abnormal levels of other serum enzymes: Secondary | ICD-10-CM | POA: Diagnosis not present

## 2024-08-15 DIAGNOSIS — Z789 Other specified health status: Secondary | ICD-10-CM

## 2024-08-15 DIAGNOSIS — Z9049 Acquired absence of other specified parts of digestive tract: Secondary | ICD-10-CM | POA: Diagnosis not present

## 2024-08-15 LAB — CBC WITH DIFFERENTIAL/PLATELET
Abs Immature Granulocytes: 0.12 K/uL — ABNORMAL HIGH (ref 0.00–0.07)
Basophils Absolute: 0 K/uL (ref 0.0–0.1)
Basophils Relative: 0 %
Eosinophils Absolute: 0 K/uL (ref 0.0–0.5)
Eosinophils Relative: 0 %
HCT: 46.9 % (ref 39.0–52.0)
Hemoglobin: 16.5 g/dL (ref 13.0–17.0)
Immature Granulocytes: 1 %
Lymphocytes Relative: 7 %
Lymphs Abs: 1.1 K/uL (ref 0.7–4.0)
MCH: 32.9 pg (ref 26.0–34.0)
MCHC: 35.2 g/dL (ref 30.0–36.0)
MCV: 93.4 fL (ref 80.0–100.0)
Monocytes Absolute: 1.9 K/uL — ABNORMAL HIGH (ref 0.1–1.0)
Monocytes Relative: 11 %
Neutro Abs: 13.6 K/uL — ABNORMAL HIGH (ref 1.7–7.7)
Neutrophils Relative %: 81 %
Platelets: 267 K/uL (ref 150–400)
RBC: 5.02 MIL/uL (ref 4.22–5.81)
RDW: 12.7 % (ref 11.5–15.5)
WBC: 16.7 K/uL — ABNORMAL HIGH (ref 4.0–10.5)
nRBC: 0 % (ref 0.0–0.2)

## 2024-08-15 LAB — I-STAT VENOUS BLOOD GAS, ED
Acid-Base Excess: 1 mmol/L (ref 0.0–2.0)
Bicarbonate: 23.6 mmol/L (ref 20.0–28.0)
Calcium, Ion: 0.84 mmol/L — CL (ref 1.15–1.40)
HCT: 47 % (ref 39.0–52.0)
Hemoglobin: 16 g/dL (ref 13.0–17.0)
O2 Saturation: 96 %
Potassium: 3.1 mmol/L — ABNORMAL LOW (ref 3.5–5.1)
Sodium: 130 mmol/L — ABNORMAL LOW (ref 135–145)
TCO2: 25 mmol/L (ref 22–32)
pCO2, Ven: 31.5 mmHg — ABNORMAL LOW (ref 44–60)
pH, Ven: 7.482 — ABNORMAL HIGH (ref 7.25–7.43)
pO2, Ven: 76 mmHg — ABNORMAL HIGH (ref 32–45)

## 2024-08-15 LAB — I-STAT CG4 LACTIC ACID, ED
Lactic Acid, Venous: 10 mmol/L (ref 0.5–1.9)
Lactic Acid, Venous: 10.1 mmol/L (ref 0.5–1.9)
Lactic Acid, Venous: 8.6 mmol/L (ref 0.5–1.9)

## 2024-08-15 LAB — COMPREHENSIVE METABOLIC PANEL WITH GFR
ALT: 91 U/L — ABNORMAL HIGH (ref 0–44)
AST: 182 U/L — ABNORMAL HIGH (ref 15–41)
Albumin: 2.8 g/dL — ABNORMAL LOW (ref 3.5–5.0)
Alkaline Phosphatase: 226 U/L — ABNORMAL HIGH (ref 38–126)
Anion gap: 25 — ABNORMAL HIGH (ref 5–15)
BUN: 7 mg/dL (ref 6–20)
CO2: 21 mmol/L — ABNORMAL LOW (ref 22–32)
Calcium: 7.7 mg/dL — ABNORMAL LOW (ref 8.9–10.3)
Chloride: 85 mmol/L — ABNORMAL LOW (ref 98–111)
Creatinine, Ser: 0.77 mg/dL (ref 0.61–1.24)
GFR, Estimated: >60 mL/min (ref >60)
Glucose, Bld: 322 mg/dL — ABNORMAL HIGH (ref 70–99)
Potassium: 3.6 mmol/L (ref 3.5–5.1)
Sodium: 131 mmol/L — ABNORMAL LOW (ref 135–145)
Total Bilirubin: 2.6 mg/dL — ABNORMAL HIGH (ref 0.0–1.2)
Total Protein: 6.7 g/dL (ref 6.5–8.1)

## 2024-08-15 LAB — I-STAT CHEM 8, ED
BUN: 9 mg/dL (ref 6–20)
Calcium, Ion: 0.86 mmol/L — CL (ref 1.15–1.40)
Chloride: 88 mmol/L — ABNORMAL LOW (ref 98–111)
Creatinine, Ser: 0.8 mg/dL (ref 0.61–1.24)
Glucose, Bld: 309 mg/dL — ABNORMAL HIGH (ref 70–99)
HCT: 48 % (ref 39.0–52.0)
Hemoglobin: 16.3 g/dL (ref 13.0–17.0)
Potassium: 3.1 mmol/L — ABNORMAL LOW (ref 3.5–5.1)
Sodium: 130 mmol/L — ABNORMAL LOW (ref 135–145)
TCO2: 22 mmol/L (ref 22–32)

## 2024-08-15 LAB — ETHANOL: Alcohol, Ethyl (B): 362 mg/dL (ref <15)

## 2024-08-15 LAB — BASIC METABOLIC PANEL WITH GFR
Anion gap: 26 — ABNORMAL HIGH (ref 5–15)
BUN: 7 mg/dL (ref 6–20)
CO2: 20 mmol/L — ABNORMAL LOW (ref 22–32)
Calcium: 7.6 mg/dL — ABNORMAL LOW (ref 8.9–10.3)
Chloride: 84 mmol/L — ABNORMAL LOW (ref 98–111)
Creatinine, Ser: 0.67 mg/dL (ref 0.61–1.24)
GFR, Estimated: >60 mL/min (ref >60)
Glucose, Bld: 304 mg/dL — ABNORMAL HIGH (ref 70–99)
Potassium: 3 mmol/L — ABNORMAL LOW (ref 3.5–5.1)
Sodium: 130 mmol/L — ABNORMAL LOW (ref 135–145)

## 2024-08-15 LAB — PROTIME-INR
INR: 1 (ref 0.8–1.2)
Prothrombin Time: 14.3 s (ref 11.4–15.2)

## 2024-08-15 LAB — TROPONIN I (HIGH SENSITIVITY)
Troponin I (High Sensitivity): 8 ng/L (ref <18)
Troponin I (High Sensitivity): 8 ng/L (ref <18)

## 2024-08-15 LAB — ACETAMINOPHEN LEVEL: Acetaminophen (Tylenol), Serum: <10 ug/mL — ABNORMAL LOW (ref 10–30)

## 2024-08-15 LAB — BILIRUBIN, DIRECT: Bilirubin, Direct: 1.2 mg/dL — ABNORMAL HIGH (ref 0.0–0.2)

## 2024-08-15 LAB — SALICYLATE LEVEL: Salicylate Lvl: <7 mg/dL — ABNORMAL LOW (ref 7.0–30.0)

## 2024-08-15 LAB — CBG MONITORING, ED
Glucose-Capillary: 175 mg/dL — ABNORMAL HIGH (ref 70–99)
Glucose-Capillary: 197 mg/dL — ABNORMAL HIGH (ref 70–99)
Glucose-Capillary: 283 mg/dL — ABNORMAL HIGH (ref 70–99)
Glucose-Capillary: 326 mg/dL — ABNORMAL HIGH (ref 70–99)

## 2024-08-15 LAB — AMMONIA: Ammonia: 66 umol/L — ABNORMAL HIGH (ref 9–35)

## 2024-08-15 LAB — LIPASE, BLOOD: Lipase: 276 U/L — ABNORMAL HIGH (ref 11–51)

## 2024-08-15 LAB — MAGNESIUM: Magnesium: 2.2 mg/dL (ref 1.7–2.4)

## 2024-08-15 LAB — BETA-HYDROXYBUTYRIC ACID: Beta-Hydroxybutyric Acid: 1.07 mmol/L — ABNORMAL HIGH (ref 0.05–0.27)

## 2024-08-15 MED ORDER — LACTATED RINGERS IV BOLUS (SEPSIS)
1000.0000 mL | Freq: Once | INTRAVENOUS | Status: AC
  Administered 2024-08-15: 1000 mL via INTRAVENOUS

## 2024-08-15 MED ORDER — FOLIC ACID 1 MG PO TABS
1.0000 mg | ORAL_TABLET | Freq: Every day | ORAL | Status: DC
  Administered 2024-08-15 – 2024-08-20 (×6): 1 mg via ORAL
  Filled 2024-08-15 (×6): qty 1

## 2024-08-15 MED ORDER — ENOXAPARIN SODIUM 40 MG/0.4ML IJ SOSY
40.0000 mg | PREFILLED_SYRINGE | INTRAMUSCULAR | Status: DC
Start: 2024-08-16 — End: 2024-08-20
  Administered 2024-08-16 – 2024-08-19 (×4): 40 mg via SUBCUTANEOUS
  Filled 2024-08-15 (×5): qty 0.4

## 2024-08-15 MED ORDER — METRONIDAZOLE 500 MG/100ML IV SOLN: 500.0000 mg | Freq: Once | INTRAVENOUS | Status: DC

## 2024-08-15 MED ORDER — ACETAMINOPHEN 650 MG RE SUPP: 650.0000 mg | Freq: Four times a day (QID) | RECTAL | Status: DC | PRN

## 2024-08-15 MED ORDER — INSULIN REGULAR(HUMAN) IN NACL 100-0.9 UT/100ML-% IV SOLN
INTRAVENOUS | Status: DC
  Administered 2024-08-15: 3.2 [IU]/h via INTRAVENOUS
  Administered 2024-08-15: 9 [IU]/h via INTRAVENOUS
  Filled 2024-08-15: qty 100

## 2024-08-15 MED ORDER — PANTOPRAZOLE SODIUM 40 MG IV SOLR
40.0000 mg | Freq: Every day | INTRAVENOUS | Status: DC
  Administered 2024-08-15 – 2024-08-16 (×2): 40 mg via INTRAVENOUS
  Filled 2024-08-15 (×2): qty 10

## 2024-08-15 MED ORDER — THIAMINE HCL 100 MG/ML IJ SOLN: 100.0000 mg | Freq: Every day | INTRAMUSCULAR | Status: DC

## 2024-08-15 MED ORDER — DEXTROSE IN LACTATED RINGERS 5 % IV SOLN: INTRAVENOUS | Status: DC

## 2024-08-15 MED ORDER — LACTATED RINGERS IV SOLN: INTRAVENOUS | Status: AC

## 2024-08-15 MED ORDER — ADULT MULTIVITAMIN W/MINERALS CH
1.0000 | ORAL_TABLET | Freq: Every day | ORAL | Status: DC
  Administered 2024-08-15 – 2024-08-20 (×6): 1 via ORAL
  Filled 2024-08-15 (×6): qty 1

## 2024-08-15 MED ORDER — POTASSIUM CHLORIDE 10 MEQ/100ML IV SOLN
10.0000 meq | INTRAVENOUS | Status: DC
Start: 2024-08-15 — End: 2024-08-16
  Administered 2024-08-16: 10 meq via INTRAVENOUS
  Filled 2024-08-15 (×2): qty 100

## 2024-08-15 MED ORDER — MORPHINE SULFATE (PF) 4 MG/ML IV SOLN
4.0000 mg | Freq: Once | INTRAVENOUS | Status: AC
  Administered 2024-08-15: 4 mg via INTRAVENOUS
  Filled 2024-08-15: qty 1

## 2024-08-15 MED ORDER — DEXTROSE 50 % IV SOLN: 0.0000 mL | INTRAVENOUS | Status: DC | PRN

## 2024-08-15 MED ORDER — IOHEXOL 350 MG/ML SOLN
75.0000 mL | Freq: Once | INTRAVENOUS | Status: AC | PRN
  Administered 2024-08-15: 75 mL via INTRAVENOUS

## 2024-08-15 MED ORDER — THIAMINE MONONITRATE 100 MG PO TABS
100.0000 mg | ORAL_TABLET | Freq: Every day | ORAL | Status: DC
  Administered 2024-08-15 – 2024-08-20 (×6): 100 mg via ORAL
  Filled 2024-08-15 (×6): qty 1

## 2024-08-15 MED ORDER — SODIUM CHLORIDE 0.9 % IV SOLN
2.0000 g | Freq: Once | INTRAVENOUS | Status: AC
  Administered 2024-08-15: 2 g via INTRAVENOUS
  Filled 2024-08-15: qty 20

## 2024-08-15 MED ORDER — POTASSIUM CHLORIDE 10 MEQ/100ML IV SOLN
10.0000 meq | INTRAVENOUS | Status: AC
  Administered 2024-08-15 (×2): 10 meq via INTRAVENOUS
  Filled 2024-08-15: qty 100

## 2024-08-15 MED ORDER — DIAZEPAM 5 MG/ML IJ SOLN
5.0000 mg | Freq: Once | INTRAMUSCULAR | Status: AC
  Administered 2024-08-15: 5 mg via INTRAVENOUS
  Filled 2024-08-15: qty 2

## 2024-08-15 MED ORDER — ACETAMINOPHEN 325 MG PO TABS
650.0000 mg | ORAL_TABLET | Freq: Four times a day (QID) | ORAL | Status: DC | PRN
  Administered 2024-08-16: 650 mg via ORAL
  Filled 2024-08-15: qty 2

## 2024-08-15 MED ORDER — LORAZEPAM 1 MG PO TABS: 1.0000 mg | ORAL_TABLET | ORAL | Status: DC | PRN

## 2024-08-15 MED ORDER — LORAZEPAM 2 MG/ML IJ SOLN
2.0000 mg | Freq: Once | INTRAMUSCULAR | Status: AC
  Administered 2024-08-15: 2 mg via INTRAVENOUS

## 2024-08-15 MED ORDER — LORAZEPAM 2 MG/ML IJ SOLN
1.0000 mg | INTRAMUSCULAR | Status: DC | PRN
  Filled 2024-08-15: qty 1

## 2024-08-15 MED ORDER — LACTATED RINGERS IV SOLN: INTRAVENOUS | Status: DC

## 2024-08-15 MED ORDER — OXYCODONE HCL 5 MG PO TABS
5.0000 mg | ORAL_TABLET | Freq: Four times a day (QID) | ORAL | Status: DC | PRN
  Administered 2024-08-15 – 2024-08-16 (×3): 10 mg via ORAL
  Filled 2024-08-15 (×3): qty 2

## 2024-08-15 NOTE — Assessment & Plan Note (Addendum)
-   Admit to med-tele, Dr. Orie, FMTS - Pain management  - Tylenol  650mg  Q6H PRN for mild pain   - Oxycodone 5-10mg  Q6H PRN for moderate-severe pain Workup  - RUS US   - Lipid Panel  - FOBT   - Consider GI consult if positive Treatment  - LR 166mL/hr    - Holding off on abx for now, consider restarting if fever occurs Monitoring  - BMP q4h  - cardiac telemetry - Protonix  IV 40mg  daily for potential GI bleed. Consider stopping if FOBT neg.

## 2024-08-15 NOTE — ED Notes (Signed)
 Dr. Mannie notified of critical ethanol level

## 2024-08-15 NOTE — H&P (Cosign Needed Addendum)
 Hospital Admission History and Physical Service Pager: [phone]  Patient name: Aaron Burke Medical record number: [id number] Date of Birth: July 17, 1986 Age: 38 y.o. Gender: male  Primary Care Provider: Dr. Richerd Legacy, MD Consultants:  Code Status: DNR/DNI  Preferred Emergency Contact: Molly English  Chief Complaint: Abdominal Pain  Differential and Medical Decision Making:  Aaron Burke is a 38 y.o. male presenting with abdominal pain. History and labs highly suggestive of alcoholic pancreatitis. SIRS criteria met, but no clear source of infection for Sepsis diagnosis. PUD also considered given potential melanotic stools, although Hgb stable. Pt was initailly empirically started on ceftriaxone and flagyl for meeting SIRs criteria, but imaging does not show acute intraabdominal infxn.  Differential for pancreatitis includes:  1. Alcoholic pancreatitis: Etoh 362, consistent with history, has not eaten much this week.  2. Gallstone/mass effect from possible pseudocyst: less likely based on CT imaging, and hx of cholecystectomy. RUQ US  ordered.  3. Elevated Triglycerides: Lipid panel ordered Assessment & Plan Pancreatitis Abdominal pain - Admit to med-tele, Dr. Orie, FMTS - Pain management  - Tylenol  650mg  Q6H PRN for mild pain   - Oxycodone 5-10mg  Q6H PRN for moderate-severe pain Workup  - RUS US   - Lipid Panel  - FOBT   - Consider GI consult if positive Treatment  - LR 119mL/hr    - Holding off on abx for now, consider restarting if fever occurs Monitoring  - BMP q4h  - cardiac telemetry - Protonix  IV 40mg  daily for potential GI bleed. Consider stopping if FOBT neg.  Increased anion gap metabolic acidosis Increased Anion Gap likely from a mixture of Starvation Ketosis, Lactic Acidosis from Dehydration, and potential DKA. Starvation is most likely in context of poor PO and chronic alcohol use disorder. LA 2/2 dehydration and acute panc is also likely  contributing. LA is now downtrending. DKA is less likely given no known hx of diabetes, although glucose is elevated. Will continue endo tool until anion gap closed.  Treatment  -s/p 2L LR bolus in ED  - Insulin Drip  - 60mEq KCl   - LR 150mL/hr OR D5 125mL/hr per endotool Monitoring  - BMP q4h  - cardiac telemetry Protonix  for history of dark stools and is prescribed at home. Alcohol use disorder Has hx of alcohol withdrawal seizure. Last drink 10/23 AM.  - Consider starting phenobarb taper.  - CIWA score q6h - Ativan  PRN per CIWA protocol - Thiamine  and folate daily - TOC consult for outpatient resources Chronic health problem Does not appear patient has picked up his psychiatric medications in the past several months. GERD - Protonix  40mg  daily   FEN/GI: NPO w/ sips and chips for meds until off Endotool VTE Prophylaxis: Lovenox   Disposition: Med-tele  History of Present Illness:  Aaron Burke is a 38 y.o. male presenting with abdominal pain. Pt has had worsening abdominal pain for the last week which has N/V, but the vomiting stopped about 36 hours ago. Reports not eating much this last week as well. Denies blood in vomit or stool, but does report dark sticky stools for past 3 weeks. Denies having had pancreatitis since his cholecystectomy, denies hx of diabetes or elevated triglycerides.   Pt reports daily drinking 4 double shots of mixed drinks (approx 8 servings daily), and had a withdrawal seizure about 5 years ago. Has been drinking about this same amount over the last week, last drink was in the morning of 10/23.    ED Course: Pt initially met  SIRS criteria (WBC 16.7, HR 136, RR 41, Temp 97.5). Code Sepsis called. Labs were notable for Na 130, Lipase 276, Ethanol 362, Lactic Acid 10.0, glucose 326, Direct Bili 1.2, Beta-Hydroxybutric acid 1.07, pH Venous 7.48. Imaging included:  CT Abdomen Pelvis: Acute Pancreatitis w/ diffuse phlegmon. CT Head: Negative CXR:  Negative  In ED, pt was treated with Ativan  2mg , Valium 5mg , Thiamine , folate, Ceftriaxone 2g, flagyl, morphine 4mg , Kcl 10mg , and started on Endotool  Review Of Systems: Per HPI with the following additions: Denies fever, SOB, chest pain, palpitation, diarrhea, recent vomiting.  Pertinent Past Medical History: alcohol use disorder, Suicide attempt, PTSD Remainder reviewed in history tab.   Pertinent Past Surgical History: Cholecystectomy.   Remainder reviewed in history tab.  Pertinent Social History: Tobacco use: Yes, Vape Alcohol use: 6-12 servings daily Other Substance use: denies  Pertinent Family History: Mother - Hyperlipidemia Father - Heart attack Paternal Grandfather - Heart attack Maternal Grandmother - Bipolar disorder Paternal Uncle - Bipolar disorder  Important Outpatient Medications: Med rec pending  Objective: BP (!) 134/98   Pulse (!) 117   Temp (!) 97.5 F (36.4 C)   Resp (!) 30   Ht 5' 9 (1.753 m)   Wt 66.7 kg   SpO2 98%   BMI 21.71 kg/m   Physical Exam Vitals and nursing note reviewed. Exam conducted with a chaperone present.  Constitutional:      Appearance: He is ill-appearing.  Cardiovascular:     Rate and Rhythm: Normal rate and regular rhythm.     Pulses: Normal pulses.     Heart sounds: No murmur heard. Pulmonary:     Effort: Pulmonary effort is normal. No respiratory distress.     Breath sounds: Normal breath sounds. No stridor. No wheezing or rhonchi.  Abdominal:     General: There is no distension.     Palpations: Abdomen is soft.     Tenderness: There is abdominal tenderness in the epigastric area, left upper quadrant and left lower quadrant.  Musculoskeletal:     Right lower leg: No edema.     Left lower leg: No edema.  Skin:    General: Skin is warm and dry.  Neurological:     Mental Status: He is alert.      Labs:  CBC BMET  Recent Labs  Lab 08/15/24 1750 08/15/24 1922 08/15/24 1923  WBC 16.7*  --   --   HGB  16.5   < > 16.3  HCT 46.9   < > 48.0  PLT 267  --   --    < > = values in this interval not displayed.   Recent Labs  Lab 08/15/24 1900 08/15/24 1922 08/15/24 1923  NA 130*   < > 130*  K 3.0*   < > 3.1*  CL 84*  --  88*  CO2 20*  --   --   BUN 7  --  9  CREATININE 0.67  --  0.80  GLUCOSE 304*  --  309*  CALCIUM 7.6*  --   --    < > = values in this interval not displayed.    Pertinent additional labs   Labs were notable for Na 130, Lipase 276, Ethanol 362, Lactic Acid 10.0, glucose 326, Direct Bili 1.2, Beta-Hydroxybutric acid 1.07, pH Venous 7.48.   EKG: My own interpretation (not copied from electronic read): QTcF: , sinus tachy   Imaging Studies Performed:   CT ABDOMEN PELVIS W CONTRAST Result Date: 08/15/2024 CLINICAL  DATA:  Acute abdominal pain EXAM: CT ABDOMEN AND PELVIS WITH CONTRAST TECHNIQUE: Multidetector CT imaging of the abdomen and pelvis was performed using the standard protocol following bolus administration of intravenous contrast. RADIATION DOSE REDUCTION: This exam was performed according to the departmental dose-optimization program which includes automated exposure control, adjustment of the mA and/or kV according to patient size and/or use of iterative reconstruction technique. CONTRAST:  75mL OMNIPAQUE  IOHEXOL  350 MG/ML SOLN COMPARISON:  09/27/2020 FINDINGS: Lower chest: Lung bases are free of acute infiltrate or sizable effusion. Hepatobiliary: Liver is diffusely decreased in attenuation consistent with fatty infiltration. The gallbladder has been surgically removed. No biliary ductal dilatation is seen. Pancreas: Inflammatory changes noted surrounding the midportion of the pancreas consistent with acute pancreatitis. This inflammatory change envelopment the second and third portions of the duodenum and extends along the anterior aspect of Gerota's fascia with more discrete collections identified. The largest of these measures approximately 4.0 x 1.7 cm  likely related to a developing pseudocyst. This is best seen on image number 51 of series 4. Spleen: Normal in size without focal abnormality. Adrenals/Urinary Tract: Adrenal glands are within normal limits. Kidneys show normal enhancement pattern. No renal calculi or obstructive changes are seen. Delayed imaging shows normal excretion. The bladder is well distended. Stomach/Bowel: No obstructive changes of the colon are seen. Some inflammatory changes noted surrounding the ascending colon as well as a portion of the sigmoid colon related to the pancreatic changes. The appendix is within normal limits. Small bowel and stomach are otherwise within normal limits aside from the inflammatory change surrounding the duodenum. Vascular/Lymphatic: No significant vascular findings are present. No enlarged abdominal or pelvic lymph nodes. Reproductive: Prostate is unremarkable. Other: Minimal free pelvic fluid is noted likely related to the inflammatory changes. Musculoskeletal: No acute or significant osseous findings. IMPRESSION: Changes consistent with acute pancreatitis with diffuse phlegmon as described. Some developing fluid collections are noted along the anterior aspect of Gerota's fascia suspicious for developing pseudocysts. Follow-up examination is recommended. Fatty liver. No other focal abnormality is noted. Electronically Signed   By: Oneil Devonshire M.D.   On: 08/15/2024 20:16   CT Head Wo Contrast Result Date: 08/15/2024 CLINICAL DATA:  Altered mental status EXAM: CT HEAD WITHOUT CONTRAST TECHNIQUE: Contiguous axial images were obtained from the base of the skull through the vertex without intravenous contrast. RADIATION DOSE REDUCTION: This exam was performed according to the departmental dose-optimization program which includes automated exposure control, adjustment of the mA and/or kV according to patient size and/or use of iterative reconstruction technique. COMPARISON:  None Available. FINDINGS: Brain:  No evidence of acute infarction, hemorrhage, hydrocephalus, extra-axial collection or mass lesion/mass effect. Mild atrophic changes and chronic white matter ischemic changes are seen. Vascular: No hyperdense vessel or unexpected calcification. Skull: Normal. Negative for fracture or focal lesion. Sinuses/Orbits: No acute finding. Other: None. IMPRESSION: Chronic atrophic and ischemic changes without acute abnormality. Electronically Signed   By: Oneil Devonshire M.D.   On: 08/15/2024 20:06   DG Chest Port 1 View Result Date: 08/15/2024 EXAM: 1 VIEW(S) XRAY OF THE CHEST 08/15/2024 07:12:00 PM COMPARISON: Comparison with 09/27/2020. CLINICAL HISTORY: Questionable sepsis - evaluate for abnormality. Patient in ED with complaints of not eating and drinking for the past 10 days. He has only had 2 Gatorades and had alcohol 4 times in the past 10 days. He reports that his abdomen is in severe pain. Reports he drinks 4 mixed drinks on a daily basis. Patient reports confusion and having  a hard time seeing. FINDINGS: LUNGS AND PLEURA: The right hemidiaphragm is elevated. No focal pulmonary opacity. No pulmonary edema. No pleural effusion. No pneumothorax. HEART AND MEDIASTINUM: No acute abnormality of the cardiac and mediastinal silhouettes. BONES AND SOFT TISSUES: No acute osseous abnormality. IMPRESSION: 1. No acute cardiopulmonary findings. Electronically signed by: Norman Gatlin MD 08/15/2024 07:17 PM EDT RP Workstation: [id number]    Lera Nancyann NOVAK, DO 08/15/2024, 9:22 PM PGY-1, [facility]  FPTS Intern pager: [phone], text pages welcome Secure chat group [facility] Teaching Service

## 2024-08-15 NOTE — Assessment & Plan Note (Addendum)
 Has hx of alcohol withdrawal seizure. Last drink 10/23 AM.  - Consider starting phenobarb taper.  - CIWA score q6h - Ativan  PRN per CIWA protocol - Thiamine  and folate daily - TOC consult for outpatient resources

## 2024-08-15 NOTE — ED Provider Notes (Signed)
 [employer] EMERGENCY DEPARTMENT AT [facility] Provider Note   CSN: [id number] Arrival date & time: 08/15/24  8277     Patient presents with: No chief complaint on file.   Aaron Burke is a 38 y.o. male.   HPI Patient is a 38 year old male with past medical history significant for suicide attempt, PTSD, depression, gallstone pancreatitis status post cholecystectomy, chronic alcohol use  Patient presents emergency room today with complaints of nausea vomiting abdominal pain and worsening fatigue over the past 1 week.  Seems he has had multiple episodes of nonbloody nonbilious emesis today.  No chest pain difficulty breathing.        Prior to Admission medications   Medication Sig Start Date End Date Taking? Authorizing Provider  ALPRAZolam  (XANAX ) 0.5 MG tablet Take one tablet by mouth daily as needed only for severe anxiety 12/19/23   Teresa Redell LABOR, NP  amphetamine -dextroamphetamine  (ADDERALL  XR) 30 MG 24 hr capsule Take 1 capsule (30 mg total) by mouth daily. 12/18/23 01/17/24  Teresa Redell LABOR, NP  amphetamine -dextroamphetamine  (ADDERALL ) 20 MG tablet Take 1 tablet (20 mg total) by mouth daily. 12/18/23 01/17/24  Teresa Redell LABOR, NP  amphetamine -dextroamphetamine  (ADDERALL ) 30 MG tablet Take one tablet by mouth daily after breakfast 07/25/23 08/21/23  Mozingo, Regina Nattalie, NP  Apremilast  (OTEZLA ) 30 MG TABS Take 1 tablet (30 mg total) by mouth 2 (two) times daily. 12/20/22   Tex Drilling, NP  buPROPion  (WELLBUTRIN  XL) 150 MG 24 hr tablet Take 1 tablet (150 mg total) by mouth daily. 12/19/23   Teresa Redell LABOR, NP  DULoxetine  (CYMBALTA ) 60 MG capsule Take 2 capsules (120 mg total) by mouth daily. 12/19/23   Teresa Redell LABOR, NP  methocarbamol  (ROBAXIN ) 750 MG tablet Take 1 tablet (750 mg total) by mouth 2 (two) times daily as needed for muscle spasms. 02/24/23   Jerri Kay HERO, MD  pantoprazole  (PROTONIX ) 40 MG tablet Take 1 tablet (40 mg total) by mouth daily. 12/20/22   Tex Drilling, NP  predniSONE  (STERAPRED UNI-PAK 21 TAB) 10 MG (21) TBPK tablet Take as directed 02/24/23   Jerri Kay HERO, MD  QUEtiapine  (SEROQUEL ) 50 MG tablet Take 1 tablet (50 mg total) by mouth 2 (two) times daily. 12/19/23   Teresa Redell LABOR, NP  QUEtiapine  Fumarate 150 MG TABS Take 150 mg by mouth at bedtime. 12/19/23   Teresa Redell LABOR, NP  Vitamin D , Ergocalciferol , (DRISDOL ) 1.25 MG (50000 UNIT) CAPS capsule Take 1 capsule (50,000 Units total) by mouth once a week. 12/26/22   Tex Drilling, NP    Allergies: Percocet [oxycodone-acetaminophen ]    Review of Systems  Updated Vital Signs BP (!) 133/100 (BP Location: Right Arm)   Pulse (!) 131   Temp (!) 97.5 F (36.4 C)   Resp (!) 26   Ht 5' 9 (1.753 m)   Wt 66.7 kg   SpO2 100%   BMI 21.71 kg/m   Physical Exam Vitals and nursing note reviewed.  Constitutional:      Appearance: He is ill-appearing.  HENT:     Head: Normocephalic and atraumatic.     Nose: Nose normal.     Mouth/Throat:     Mouth: Mucous membranes are dry.  Eyes:     General: No scleral icterus. Cardiovascular:     Rate and Rhythm: Regular rhythm. Tachycardia present.     Pulses: Normal pulses.     Heart sounds: Normal heart sounds.  Pulmonary:     Effort: Pulmonary effort is  normal. No respiratory distress.     Breath sounds: No wheezing.  Abdominal:     Palpations: Abdomen is soft.     Tenderness: There is abdominal tenderness. There is guarding.     Comments: Epigastric tenderness and guarding, diffuse upper abdominal tenderness  Musculoskeletal:     Cervical back: Normal range of motion.     Right lower leg: No edema.     Left lower leg: No edema.  Skin:    General: Skin is warm and dry.     Capillary Refill: Capillary refill takes less than 2 seconds.  Neurological:     Mental Status: He is alert. Mental status is at baseline.     Comments: Fatigued and somnolent but oriented and able to answer questions appropriately follow commands and has a normal  neurologic exam with normal sensation and motor function throughout  Psychiatric:        Mood and Affect: Mood normal.        Behavior: Behavior normal.     (all labs ordered are listed, but only abnormal results are displayed) Labs Reviewed  CBC WITH DIFFERENTIAL/PLATELET - Abnormal; Notable for the following components:      Result Value   WBC 16.7 (*)    Neutro Abs 13.6 (*)    Monocytes Absolute 1.9 (*)    Abs Immature Granulocytes 0.12 (*)    All other components within normal limits  COMPREHENSIVE METABOLIC PANEL WITH GFR - Abnormal; Notable for the following components:   Sodium 131 (*)    Chloride 85 (*)    CO2 21 (*)    Glucose, Bld 322 (*)    Calcium 7.7 (*)    Albumin 2.8 (*)    AST 182 (*)    ALT 91 (*)    Alkaline Phosphatase 226 (*)    Total Bilirubin 2.6 (*)    Anion gap 25 (*)    All other components within normal limits  LIPASE, BLOOD - Abnormal; Notable for the following components:   Lipase 276 (*)    All other components within normal limits  ETHANOL - Abnormal; Notable for the following components:   Alcohol, Ethyl (B) 362 (*)    All other components within normal limits  AMMONIA - Abnormal; Notable for the following components:   Ammonia 66 (*)    All other components within normal limits  I-STAT CG4 LACTIC ACID, ED - Abnormal; Notable for the following components:   Lactic Acid, Venous 10.0 (*)    All other components within normal limits  I-STAT CG4 LACTIC ACID, ED - Abnormal; Notable for the following components:   Lactic Acid, Venous 10.1 (*)    All other components within normal limits  CBG MONITORING, ED - Abnormal; Notable for the following components:   Glucose-Capillary 326 (*)    All other components within normal limits  I-STAT VENOUS BLOOD GAS, ED - Abnormal; Notable for the following components:   pH, Ven 7.482 (*)    pCO2, Ven 31.5 (*)    pO2, Ven 76 (*)    Sodium 130 (*)    Potassium 3.1 (*)    Calcium, Ion 0.84 (*)    All  other components within normal limits  I-STAT CHEM 8, ED - Abnormal; Notable for the following components:   Sodium 130 (*)    Potassium 3.1 (*)    Chloride 88 (*)    Glucose, Bld 309 (*)    Calcium, Ion 0.86 (*)    All other  components within normal limits  RESP PANEL BY RT-PCR (RSV, FLU A&B, COVID)  RVPGX2  CULTURE, BLOOD (ROUTINE X 2)  CULTURE, BLOOD (ROUTINE X 2)  MAGNESIUM   PROTIME-INR  URINALYSIS, W/ REFLEX TO CULTURE (INFECTION SUSPECTED)  RAPID URINE DRUG SCREEN, HOSP PERFORMED  SALICYLATE LEVEL  ACETAMINOPHEN  LEVEL  BILIRUBIN, DIRECT  BASIC METABOLIC PANEL WITH GFR  BASIC METABOLIC PANEL WITH GFR  BASIC METABOLIC PANEL WITH GFR  BASIC METABOLIC PANEL WITH GFR  BETA-HYDROXYBUTYRIC ACID  BETA-HYDROXYBUTYRIC ACID  BETA-HYDROXYBUTYRIC ACID  BETA-HYDROXYBUTYRIC ACID  I-STAT CG4 LACTIC ACID, ED  TROPONIN I (HIGH SENSITIVITY)  TROPONIN I (HIGH SENSITIVITY)    EKG: None  Radiology: DG Chest Port 1 View Result Date: 08/15/2024 EXAM: 1 VIEW(S) XRAY OF THE CHEST 08/15/2024 07:12:00 PM COMPARISON: Comparison with 09/27/2020. CLINICAL HISTORY: Questionable sepsis - evaluate for abnormality. Patient in ED with complaints of not eating and drinking for the past 10 days. He has only had 2 Gatorades and had alcohol 4 times in the past 10 days. He reports that his abdomen is in severe pain. Reports he drinks 4 mixed drinks on a daily basis. Patient reports confusion and having a hard time seeing. FINDINGS: LUNGS AND PLEURA: The right hemidiaphragm is elevated. No focal pulmonary opacity. No pulmonary edema. No pleural effusion. No pneumothorax. HEART AND MEDIASTINUM: No acute abnormality of the cardiac and mediastinal silhouettes. BONES AND SOFT TISSUES: No acute osseous abnormality. IMPRESSION: 1. No acute cardiopulmonary findings. Electronically signed by: Norman Gatlin MD 08/15/2024 07:17 PM EDT RP Workstation: [id number]     .Critical Care  Performed by: Neldon Hamp RAMAN,  PA Authorized by: Neldon Hamp RAMAN, PA   Critical care provider statement:    Critical care time (minutes):  35   Critical care time was exclusive of:  Separately billable procedures and treating other patients and teaching time   Critical care was necessary to treat or prevent imminent or life-threatening deterioration of the following conditions:  Sepsis and metabolic crisis   Critical care was time spent personally by me on the following activities:  Development of treatment plan with patient or surrogate, review of old charts, re-evaluation of patient's condition, pulse oximetry, ordering and review of radiographic studies, ordering and review of laboratory studies, ordering and performing treatments and interventions, obtaining history from patient or surrogate, examination of patient and evaluation of patient's response to treatment   Care discussed with: admitting provider   .Ultrasound ED Peripheral IV (Provider)  Date/Time: 08/15/2024 7:46 PM  Performed by: Neldon Hamp RAMAN, PA Authorized by: Neldon Hamp RAMAN, PA   Procedure details:    Skin Prep: chlorhexidine gluconate     Location:  Right AC   Angiocath:  18 G   Bedside Ultrasound Guided: Yes     Images: archived     Patient tolerated procedure without complications: Yes     Dressing applied: Yes      Medications Ordered in the ED  LORazepam  (ATIVAN ) tablet 1-4 mg (has no administration in time range)    Or  LORazepam  (ATIVAN ) injection 1-4 mg (has no administration in time range)  thiamine  (VITAMIN B1) tablet 100 mg (has no administration in time range)    Or  thiamine  (VITAMIN B1) injection 100 mg (has no administration in time range)  folic acid (FOLVITE) tablet 1 mg (has no administration in time range)  multivitamin with minerals tablet 1 tablet (has no administration in time range)  lactated ringers  infusion (has no administration in time range)  lactated ringers   bolus 1,000 mL (1,000 mLs Intravenous New Bag/Given  08/15/24 1919)    And  lactated ringers  bolus 1,000 mL (1,000 mLs Intravenous New Bag/Given 08/15/24 1925)  cefTRIAXone (ROCEPHIN) 2 g in sodium chloride  0.9 % 100 mL IVPB (has no administration in time range)  metroNIDAZOLE (FLAGYL) IVPB 500 mg (has no administration in time range)  diazepam (VALIUM) injection 5 mg (has no administration in time range)  insulin regular, human (MYXREDLIN) 100 units/ 100 mL infusion (has no administration in time range)  dextrose 5 % in lactated ringers  infusion (has no administration in time range)  dextrose 50 % solution 0-50 mL (has no administration in time range)  potassium chloride 10 mEq in 100 mL IVPB (has no administration in time range)  lactated ringers  infusion (has no administration in time range)  LORazepam  (ATIVAN ) injection 2 mg (2 mg Intravenous Given 08/15/24 1803)                                    Medical Decision Making Amount and/or Complexity of Data Reviewed Labs: ordered. Radiology: ordered.  Risk OTC drugs. Prescription drug management. Decision regarding hospitalization.   This patient presents to the ED for concern of AMS, this involves a number of treatment options, and is a complaint that carries with it a high risk of complications and morbidity. A differential diagnosis was considered for the patient's symptoms which is discussed below:   The differential diagnosis for AMS is extensive and includes, but is not limited to: drug overdose - opioids, alcohol, sedatives, antipsychotics, drug withdrawal, others; Metabolic: hypoxia, hypoglycemia, hyperglycemia, hypercalcemia, hypernatremia, hyponatremia, uremia, hepatic encephalopathy, hypothyroidism, hyperthyroidism, vitamin B12 or thiamine  deficiency, carbon monoxide poisoning, Wilson's disease, Lactic acidosis, DKA/HHOS; Infectious: meningitis, encephalitis, bacteremia/sepsis, urinary tract infection, pneumonia, neurosyphilis; Structural: Space-occupying lesion, (brain tumor,  subdural hematoma, hydrocephalus,); Vascular: stroke, subarachnoid hemorrhage, coronary ischemia, hypertensive encephalopathy, CNS vasculitis, thrombotic thrombocytopenic purpura, disseminated intravascular coagulation, hyperviscosity; Psychiatric: Schizophrenia, depression; Other: Seizure, hypothermia, heat stroke, dementia    Co morbidities: Discussed in HPI   Brief History:  Patient is a 38 year old male with past medical history significant for suicide attempt, PTSD, depression, gallstone pancreatitis status post cholecystectomy, chronic alcohol use  Patient presents emergency room today with complaints of nausea vomiting abdominal pain and worsening fatigue over the past 1 week.  Seems he has had multiple episodes of nonbloody nonbilious emesis today.  No chest pain difficulty breathing.     EMR reviewed including pt PMHx, past surgical history and past visits to ER.   See HPI for more details   Lab Tests:   I ordered and independently interpreted labs. Labs notable for   Imaging Studies:  Abnormal findings. I personally reviewed all imaging studies. Imaging notable for Extensive stranding around the pancreas with pancreatitis and phlegmon formation   Cardiac Monitoring:  The patient was maintained on a cardiac monitor.  I personally viewed and interpreted the cardiac monitored which showed an underlying rhythm of: Tachycardia EKG non-ischemic   Medicines ordered:  I ordered medication including Rocephin, Flagyl, morphine, lactated Ringer 's 30 mL/kg bolus, Valium, Ativan  for withdrawal, potassium and insulin and dextrose Protonix  thiamine  for possible alcohol withdrawal, intra-abdominal infection, sepsis Reevaluation of the patient after these medicines showed that the patient improved I have reviewed the patients home medicines and have made adjustments as needed   Critical Interventions:   correction of anion gap, hydration, treatment of  sepsis   Consults/Attending  Physician   I discussed this case with my attending physician who cosigned this note including patient's presenting symptoms, physical exam, and planned diagnostics and interventions. Attending physician stated agreement with plan or made changes to plan which were implemented.  Discussed case with family medicine teaching service who will admit  Reevaluation:  After the interventions noted above I re-evaluated patient and found that they have :improved   Social Determinants of Health:  The patient's social determinants of health were a factor in the care of this patient    Problem List / ED Course:  Patient with pancreatitis with phlegmon empiric antibiotics, hydration, pain control ordered.  Admitted to the hospital patient is acutely ill   Dispostion:  After consideration of the diagnostic results and the patients response to treatment, I feel that the patent would benefit from admission   Final diagnoses:  None    ED Discharge Orders     None          Neldon Hamp RAMAN, [place] 08/16/24 0003    Mannie Pac T, DO 08/16/24 2219

## 2024-08-15 NOTE — Assessment & Plan Note (Signed)
 Does not appear patient has picked up his psychiatric medications in the past several months. GERD - Protonix  40mg  daily

## 2024-08-15 NOTE — ED Provider Triage Note (Signed)
 Emergency Medicine Provider Triage Evaluation Note  Aaron Burke , a 38 y.o. male  was evaluated in triage.  Pt complains of epigastric pain for the past 10 days and some confusion. Reports constatnt nausea. Did have some vomiting int he beginning. Denies any chest pain or SOB. Reports some darker stools as well. Denies urinary symptoms. He reports that he usually has 4 mixed drinks per day. He has only had 4 mixed drinks all together over the past few days. Denies any seizures, but has had alcohol withdrawal seizures before. He reports some hallucinations for the past few days. Additionally reports diplopia for the past 18 hours. Bilateral. No headache. Reports generalized weakness.   Review of Systems  Positive:  Negative:   Physical Exam  BP (!) 134/109 (BP Location: Left Arm)   Pulse (!) 136   Temp (!) 97.5 F (36.4 C)   Resp (!) 22   Ht 5' 9 (1.753 m)   Wt 66.7 kg   SpO2 96%   BMI 21.71 kg/m  Gen:   Uncomfortable, A&Ox4 Resp:  Normal effort  MSK:   Moves extremities without difficulty  Other:  Cranial nerves grossly intact. Strength intact and symmetric in the upper and lower bilateral extremities. Answers questions appropriately. No facial droop or slurred speech appreciated. No tremor. Epigastric tenderness to palpation. Tachycardia. Dry mucous membranes.   Medical Decision Making  Medically screening exam initiated at 5:56 PM.  Appropriate orders placed.  Aaron Burke was informed that the remainder of the evaluation will be completed by another provider, this initial triage assessment does not replace that evaluation, and the importance of remaining in the ED until their evaluation is complete.  Patient is being roomed to the [facility] zone now.    Aaron Ernst, PA-C 08/15/24 1800

## 2024-08-15 NOTE — Hospital Course (Addendum)
 This is a 38 year old male with PMH alcohol use disorder, PTSD, MDD, GERD, GAD, HLD who was admitted to the [facility] family medicine teaching service for acute pancreatitis and starvation/alcohol ketoacidosis.  His hospital course is detailed below:  Acute pancreatitis Patient admitted with several days of poor p.o. intake, abdominal pain, nausea vomiting with lipase greater than 3 times normal limits and CT findings with pancreatic inflammation and likely developing pseudocyst, in the setting of elevated ethanol and history of significant alcohol use.  Patient started on 1.5 maintenance and scheduled pain control regimen.  Patient's pain and nausea improved during admission, and diet was advance as tolerated.  Patient tolerated soft diet at time of discharge.  Starvation/alcohol ketoacidosis AUD Transaminitis  Refeeding Syndrome Urinary ketones, hypokalemia, hyperglycemia, and anion gap on admission.  With patient reporting poor p.o. take and four drinks daily. patient placed on insulin drip with IVF. Insulin drip discontinued when patient's glucose returned to normal and anion gap improved, notably A1c 8.0 less likely to be diabetic ketoacidosis. IV thiamine  administered. AST: ALT ratio greater than 2.  Right upper quadrant ultrasound hepatic steatosis, lipid and hepatitis panel within normal limits.  Patient interested in alcohol cessation, and detox done with phenobarbital taper inpatient and continued on discharge, to end on 11/2. Potassium and phosphorus recurrently depleted and supplementation scheduled.     Lumbar pain, LE weakness Chronic lumbar pain without neuropathic pain symptoms; however, acute onset of lower extremity weakness and new report of urinary hesitancy.  Lumbar MRI did not reveal any stenosis. Patient started on Cymbalta , which he had taken previously.  Melena Patient reported 3-week history of melanous stools.  No hematochezia.  Hemoglobin within normal limits.  PPI twice  daily started for possible upper GI bleed. FOBT positive but hemoglobin stable. Protonix  reduced to once daily dosing for GERD.   PCP recommendations: Consider referral for colonoscopy or endoscopy  Discuss initiation of naltrexone for AUD after opioid cessation Recheck BMP for electrolytes given refeeding syndrome inpatient Evaluate efficacy of Cymbalta , and consider other medication for depression  Discuss seeing psychiatry for further treatment of depression and AUD Initiate treatment for new DM diagnosis, A1C 8.0

## 2024-08-15 NOTE — Assessment & Plan Note (Deleted)
 Reports history of dark tarry stool for a while. Not anemic per labs. - Protonix  40mg  daily - FOBT  - Consider GI consult if positive.

## 2024-08-15 NOTE — Sepsis Progress Note (Signed)
 Notified bedside nurse of need to draw blood cultures. Via secure chat

## 2024-08-15 NOTE — Assessment & Plan Note (Addendum)
 Increased Anion Gap likely from a mixture of Starvation Ketosis, Lactic Acidosis from Dehydration, and potential DKA. Starvation is most likely in context of poor PO and chronic alcohol use disorder. LA 2/2 dehydration and acute panc is also likely contributing. LA is now downtrending. DKA is less likely given no known hx of diabetes, although glucose is elevated. Will continue endo tool until anion gap closed.  Treatment  -s/p 2L LR bolus in ED  - Insulin Drip  - 60mEq KCl   - LR 150mL/hr OR D5 125mL/hr per endotool Monitoring  - BMP q4h  - cardiac telemetry Protonix  for history of dark stools and is prescribed at home.

## 2024-08-15 NOTE — ED Triage Notes (Signed)
 Patient in ED with complaints of not eating and drinking for the past 10 days. He has only had 2 Gatorades and had alcohol 4 times in the past 10 days. He reports that his abdomen is in severe pain. Reports he drinks 4 mixed drinks on a daily basis.  Patient reports confusion and having a hard time seeing.

## 2024-08-15 NOTE — Sepsis Progress Note (Signed)
 Elink monitoring for the code sepsis protocol.

## 2024-08-15 NOTE — Sepsis Progress Note (Signed)
 Notified bedside nurse of need to draw repeat lactic acid. Via secure Chat

## 2024-08-16 ENCOUNTER — Inpatient Hospital Stay (HOSPITAL_COMMUNITY): Payer: MEDICAID

## 2024-08-16 DIAGNOSIS — F1090 Alcohol use, unspecified, uncomplicated: Secondary | ICD-10-CM

## 2024-08-16 DIAGNOSIS — R739 Hyperglycemia, unspecified: Secondary | ICD-10-CM | POA: Diagnosis present

## 2024-08-16 DIAGNOSIS — K921 Melena: Secondary | ICD-10-CM | POA: Insufficient documentation

## 2024-08-16 DIAGNOSIS — E860 Dehydration: Secondary | ICD-10-CM | POA: Diagnosis present

## 2024-08-16 DIAGNOSIS — K852 Alcohol induced acute pancreatitis without necrosis or infection: Secondary | ICD-10-CM

## 2024-08-16 DIAGNOSIS — K859 Acute pancreatitis without necrosis or infection, unspecified: Secondary | ICD-10-CM | POA: Diagnosis present

## 2024-08-16 LAB — CBG MONITORING, ED
Glucose-Capillary: 149 mg/dL — ABNORMAL HIGH (ref 70–99)
Glucose-Capillary: 188 mg/dL — ABNORMAL HIGH (ref 70–99)
Glucose-Capillary: 248 mg/dL — ABNORMAL HIGH (ref 70–99)
Glucose-Capillary: 160 mg/dL — ABNORMAL HIGH (ref 70–99)
Glucose-Capillary: 117 mg/dL — ABNORMAL HIGH (ref 70–99)
Glucose-Capillary: 103 mg/dL — ABNORMAL HIGH (ref 70–99)
Glucose-Capillary: 142 mg/dL — ABNORMAL HIGH (ref 70–99)
Glucose-Capillary: 150 mg/dL — ABNORMAL HIGH (ref 70–99)
Glucose-Capillary: 233 mg/dL — ABNORMAL HIGH (ref 70–99)

## 2024-08-16 LAB — BASIC METABOLIC PANEL WITH GFR
Anion gap: 10 (ref 5–15)
Anion gap: 12 (ref 5–15)
Anion gap: 16 — ABNORMAL HIGH (ref 5–15)
Anion gap: 16 — ABNORMAL HIGH (ref 5–15)
BUN: 5 mg/dL — ABNORMAL LOW (ref 6–20)
BUN: 6 mg/dL (ref 6–20)
BUN: 6 mg/dL (ref 6–20)
BUN: 7 mg/dL (ref 6–20)
CO2: 24 mmol/L (ref 22–32)
CO2: 24 mmol/L (ref 22–32)
CO2: 26 mmol/L (ref 22–32)
CO2: 26 mmol/L (ref 22–32)
Calcium: 7 mg/dL — ABNORMAL LOW (ref 8.9–10.3)
Calcium: 7.3 mg/dL — ABNORMAL LOW (ref 8.9–10.3)
Calcium: 7.3 mg/dL — ABNORMAL LOW (ref 8.9–10.3)
Calcium: 7.8 mg/dL — ABNORMAL LOW (ref 8.9–10.3)
Chloride: 91 mmol/L — ABNORMAL LOW (ref 98–111)
Chloride: 92 mmol/L — ABNORMAL LOW (ref 98–111)
Chloride: 94 mmol/L — ABNORMAL LOW (ref 98–111)
Chloride: 95 mmol/L — ABNORMAL LOW (ref 98–111)
Creatinine, Ser: 0.46 mg/dL — ABNORMAL LOW (ref 0.61–1.24)
Creatinine, Ser: 0.49 mg/dL — ABNORMAL LOW (ref 0.61–1.24)
Creatinine, Ser: 0.55 mg/dL — ABNORMAL LOW (ref 0.61–1.24)
Creatinine, Ser: 0.55 mg/dL — ABNORMAL LOW (ref 0.61–1.24)
GFR, Estimated: >60 mL/min (ref >60)
GFR, Estimated: >60 mL/min (ref >60)
GFR, Estimated: >60 mL/min (ref >60)
GFR, Estimated: >60 mL/min (ref >60)
Glucose, Bld: 131 mg/dL — ABNORMAL HIGH (ref 70–99)
Glucose, Bld: 154 mg/dL — ABNORMAL HIGH (ref 70–99)
Glucose, Bld: 176 mg/dL — ABNORMAL HIGH (ref 70–99)
Glucose, Bld: 89 mg/dL (ref 70–99)
Potassium: 3.1 mmol/L — ABNORMAL LOW (ref 3.5–5.1)
Potassium: 3.4 mmol/L — ABNORMAL LOW (ref 3.5–5.1)
Potassium: 3.7 mmol/L (ref 3.5–5.1)
Potassium: 3.9 mmol/L (ref 3.5–5.1)
Sodium: 131 mmol/L — ABNORMAL LOW (ref 135–145)
Sodium: 131 mmol/L — ABNORMAL LOW (ref 135–145)
Sodium: 132 mmol/L — ABNORMAL LOW (ref 135–145)
Sodium: 132 mmol/L — ABNORMAL LOW (ref 135–145)

## 2024-08-16 LAB — URINALYSIS, W/ REFLEX TO CULTURE (INFECTION SUSPECTED)
Bacteria, UA: NONE SEEN
Bilirubin Urine: NEGATIVE
Glucose, UA: >=500 mg/dL — AB
Hgb urine dipstick: NEGATIVE
Ketones, ur: 20 mg/dL — AB
Leukocytes,Ua: NEGATIVE
Nitrite: NEGATIVE
Protein, ur: NEGATIVE mg/dL
Specific Gravity, Urine: >1.046 — ABNORMAL HIGH (ref 1.005–1.030)
pH: 6 (ref 5.0–8.0)

## 2024-08-16 LAB — RAPID URINE DRUG SCREEN, HOSP PERFORMED
Amphetamines: NOT DETECTED
Barbiturates: NOT DETECTED
Benzodiazepines: NOT DETECTED
Cocaine: NOT DETECTED
Opiates: POSITIVE — AB
Tetrahydrocannabinol: NOT DETECTED

## 2024-08-16 LAB — RESP PANEL BY RT-PCR (RSV, FLU A&B, COVID)  RVPGX2
Influenza A by PCR: NEGATIVE
Influenza B by PCR: NEGATIVE
Resp Syncytial Virus by PCR: NEGATIVE
SARS Coronavirus 2 by RT PCR: NEGATIVE

## 2024-08-16 LAB — LIPID PANEL
Cholesterol: 147 mg/dL (ref 0–200)
HDL: 33 mg/dL — ABNORMAL LOW (ref >40)
LDL Cholesterol: 85 mg/dL (ref 0–99)
Total CHOL/HDL Ratio: 4.5 ratio
Triglycerides: 144 mg/dL (ref <150)
VLDL: 29 mg/dL (ref 0–40)

## 2024-08-16 LAB — I-STAT CG4 LACTIC ACID, ED: Lactic Acid, Venous: 5.5 mmol/L (ref 0.5–1.9)

## 2024-08-16 LAB — MAGNESIUM: Magnesium: 1.9 mg/dL (ref 1.7–2.4)

## 2024-08-16 LAB — MRSA NEXT GEN BY PCR, NASAL: MRSA by PCR Next Gen: NOT DETECTED

## 2024-08-16 LAB — CK: Total CK: 38 U/L — ABNORMAL LOW (ref 49–397)

## 2024-08-16 LAB — BETA-HYDROXYBUTYRIC ACID
Beta-Hydroxybutyric Acid: 0.32 mmol/L — ABNORMAL HIGH (ref 0.05–0.27)
Beta-Hydroxybutyric Acid: 0.47 mmol/L — ABNORMAL HIGH (ref 0.05–0.27)

## 2024-08-16 LAB — GLUCOSE, CAPILLARY: Glucose-Capillary: 152 mg/dL — ABNORMAL HIGH (ref 70–99)

## 2024-08-16 LAB — HIV ANTIBODY (ROUTINE TESTING W REFLEX): HIV Screen 4th Generation wRfx: NONREACTIVE

## 2024-08-16 LAB — PHOSPHORUS: Phosphorus: <1 mg/dL — CL (ref 2.5–4.6)

## 2024-08-16 LAB — HEMOGLOBIN A1C
Hgb A1c MFr Bld: 8 % — ABNORMAL HIGH (ref 4.8–5.6)
Mean Plasma Glucose: 182.9 mg/dL

## 2024-08-16 LAB — OCCULT BLOOD X 1 CARD TO LAB, STOOL: Fecal Occult Bld: POSITIVE — AB

## 2024-08-16 MED ORDER — ORAL CARE MOUTH RINSE: 15.0000 mL | OROMUCOSAL | Status: DC | PRN

## 2024-08-16 MED ORDER — ADULT MULTIVITAMIN W/MINERALS CH: 1.0000 | ORAL_TABLET | Freq: Every day | ORAL | Status: DC

## 2024-08-16 MED ORDER — GADOBUTROL 1 MMOL/ML IV SOLN
7.0000 mL | Freq: Once | INTRAVENOUS | Status: AC | PRN
  Administered 2024-08-16: 7 mL via INTRAVENOUS

## 2024-08-16 MED ORDER — HYDROMORPHONE HCL 1 MG/ML IJ SOLN
0.5000 mg | Freq: Four times a day (QID) | INTRAMUSCULAR | Status: DC | PRN
  Administered 2024-08-16: 0.5 mg via INTRAVENOUS
  Filled 2024-08-16: qty 1

## 2024-08-16 MED ORDER — HYDROMORPHONE HCL 1 MG/ML IJ SOLN
0.5000 mg | Freq: Once | INTRAMUSCULAR | Status: AC
  Administered 2024-08-16: 0.5 mg via INTRAVENOUS
  Filled 2024-08-16: qty 0.5

## 2024-08-16 MED ORDER — ACETAMINOPHEN 650 MG RE SUPP: 650.0000 mg | Freq: Four times a day (QID) | RECTAL | Status: DC

## 2024-08-16 MED ORDER — THIAMINE HCL 100 MG/ML IJ SOLN
500.0000 mg | Freq: Once | INTRAMUSCULAR | Status: AC
  Administered 2024-08-16: 500 mg via INTRAVENOUS
  Filled 2024-08-16: qty 5

## 2024-08-16 MED ORDER — LORAZEPAM 1 MG PO TABS: 1.0000 mg | ORAL_TABLET | ORAL | Status: DC | PRN

## 2024-08-16 MED ORDER — HYDROMORPHONE HCL 1 MG/ML IJ SOLN
0.5000 mg | INTRAMUSCULAR | Status: AC
  Administered 2024-08-16: 0.5 mg via INTRAVENOUS
  Filled 2024-08-16: qty 1

## 2024-08-16 MED ORDER — POTASSIUM CHLORIDE 10 MEQ/100ML IV SOLN
10.0000 meq | INTRAVENOUS | Status: AC
  Administered 2024-08-16 (×5): 10 meq via INTRAVENOUS
  Filled 2024-08-16 (×4): qty 100

## 2024-08-16 MED ORDER — LIVING WELL WITH DIABETES BOOK
Freq: Once | Status: AC
  Filled 2024-08-16: qty 1

## 2024-08-16 MED ORDER — LORAZEPAM 2 MG/ML IJ SOLN: 1.0000 mg | INTRAMUSCULAR | Status: DC | PRN

## 2024-08-16 MED ORDER — PHENOBARBITAL 32.4 MG PO TABS
32.4000 mg | ORAL_TABLET | Freq: Three times a day (TID) | ORAL | Status: DC
  Administered 2024-08-18 – 2024-08-19 (×4): 32.4 mg via ORAL
  Filled 2024-08-16 (×4): qty 1

## 2024-08-16 MED ORDER — LORAZEPAM 2 MG/ML IJ SOLN
1.0000 mg | INTRAMUSCULAR | Status: AC
  Administered 2024-08-16: 1 mg via INTRAVENOUS
  Filled 2024-08-16: qty 1

## 2024-08-16 MED ORDER — INSULIN ASPART 100 UNIT/ML IJ SOLN
0.0000 [IU] | Freq: Three times a day (TID) | INTRAMUSCULAR | Status: DC
  Administered 2024-08-16: 5 [IU] via SUBCUTANEOUS
  Administered 2024-08-16 – 2024-08-17 (×2): 2 [IU] via SUBCUTANEOUS
  Administered 2024-08-17 – 2024-08-18 (×2): 3 [IU] via SUBCUTANEOUS
  Administered 2024-08-18: 5 [IU] via SUBCUTANEOUS
  Administered 2024-08-19 (×2): 2 [IU] via SUBCUTANEOUS
  Administered 2024-08-19: 5 [IU] via SUBCUTANEOUS
  Administered 2024-08-20: 3 [IU] via SUBCUTANEOUS
  Administered 2024-08-20: 2 [IU] via SUBCUTANEOUS

## 2024-08-16 MED ORDER — OXYCODONE HCL 5 MG PO TABS
10.0000 mg | ORAL_TABLET | Freq: Four times a day (QID) | ORAL | Status: DC | PRN
  Administered 2024-08-16 – 2024-08-17 (×3): 10 mg via ORAL
  Filled 2024-08-16 (×3): qty 2

## 2024-08-16 MED ORDER — POTASSIUM & SODIUM PHOSPHATES 280-160-250 MG PO PACK
1.0000 | PACK | Freq: Once | ORAL | Status: AC
  Administered 2024-08-16: 1 via ORAL
  Filled 2024-08-16: qty 1

## 2024-08-16 MED ORDER — HYDROMORPHONE HCL 1 MG/ML IJ SOLN
0.5000 mg | Freq: Four times a day (QID) | INTRAMUSCULAR | Status: DC | PRN
  Administered 2024-08-16 – 2024-08-17 (×3): 0.5 mg via INTRAVENOUS
  Filled 2024-08-16 (×2): qty 0.5
  Filled 2024-08-16: qty 1
  Filled 2024-08-16: qty 0.5

## 2024-08-16 MED ORDER — GLYCERIN (LAXATIVE) 2 G RE SUPP
1.0000 | Freq: Once | RECTAL | Status: AC
  Administered 2024-08-16: 1 via RECTAL
  Filled 2024-08-16: qty 1

## 2024-08-16 MED ORDER — INSULIN STARTER KIT- PEN NEEDLES (ENGLISH)
1.0000 | Freq: Once | Status: DC
  Filled 2024-08-16: qty 1

## 2024-08-16 MED ORDER — PHENOBARBITAL 32.4 MG PO TABS
64.8000 mg | ORAL_TABLET | Freq: Three times a day (TID) | ORAL | Status: DC
  Administered 2024-08-16: 64.8 mg via ORAL
  Filled 2024-08-16: qty 2

## 2024-08-16 MED ORDER — POTASSIUM CHLORIDE 20 MEQ PO PACK
40.0000 meq | PACK | Freq: Once | ORAL | Status: AC
  Administered 2024-08-16: 40 meq via ORAL
  Filled 2024-08-16: qty 2

## 2024-08-16 MED ORDER — ACETAMINOPHEN 325 MG PO TABS
650.0000 mg | ORAL_TABLET | Freq: Four times a day (QID) | ORAL | Status: DC
  Administered 2024-08-16 – 2024-08-17 (×4): 650 mg via ORAL
  Filled 2024-08-16 (×4): qty 2

## 2024-08-16 MED ORDER — THIAMINE MONONITRATE 100 MG PO TABS: 100.0000 mg | ORAL_TABLET | Freq: Every day | ORAL | Status: DC

## 2024-08-16 MED ORDER — FOLIC ACID 1 MG PO TABS: 1.0000 mg | ORAL_TABLET | Freq: Every day | ORAL | Status: DC

## 2024-08-16 MED ORDER — PANTOPRAZOLE SODIUM 40 MG IV SOLR
40.0000 mg | Freq: Two times a day (BID) | INTRAVENOUS | Status: DC
  Administered 2024-08-16: 40 mg via INTRAVENOUS
  Filled 2024-08-16: qty 10

## 2024-08-16 MED ORDER — THIAMINE HCL 100 MG/ML IJ SOLN: 100.0000 mg | Freq: Every day | INTRAMUSCULAR | Status: DC

## 2024-08-16 MED ORDER — OXYCODONE HCL 5 MG PO TABS
5.0000 mg | ORAL_TABLET | Freq: Four times a day (QID) | ORAL | Status: DC | PRN
  Filled 2024-08-16: qty 1

## 2024-08-16 MED ORDER — PHENOBARBITAL 32.4 MG PO TABS
64.8000 mg | ORAL_TABLET | Freq: Three times a day (TID) | ORAL | Status: AC
  Administered 2024-08-16 – 2024-08-17 (×5): 64.8 mg via ORAL
  Filled 2024-08-16 (×5): qty 2

## 2024-08-16 NOTE — Inpatient Diabetes Management (Addendum)
 Inpatient Diabetes Program Recommendations  AACE/ADA: New Consensus Statement on Inpatient Glycemic Control (2015)  Target Ranges:  Prepandial:   less than 140 mg/dL      Peak postprandial:   less than 180 mg/dL (1-2 hours)      Critically ill patients:  140 - 180 mg/dL   Lab Results  Component Value Date   GLUCAP 103 (H) 08/16/2024   HGBA1C 8.0 (H) 08/16/2024    Review of Glycemic Control  Latest Reference Range & Units 08/16/24 05:05 08/16/24 06:11 08/16/24 07:19 08/16/24 08:28  Glucose-Capillary 70 - 99 mg/dL 751 (H) 839 (H) 882 (H) 103 (H)  (H): Data is abnormally high Diabetes history: No DM hx Outpatient Diabetes medications: none Current orders for Inpatient glycemic control: IV insulin  Inpatient Diabetes Program Recommendations:    Noted A1C meets ADA guidelines for DM diagnosis. Once diagnosis made, can begin teaching.   AddendumBETHA Rosa SHORTEN, ref [id number], dietitian consult and attached education to DC summary.  Spoke with patient regarding outpatient diabetes management. Admits to recently feeling polyuria and polydipsia. Reviewed patient's current A1c of 8%. Explained what a A1c is and what it measures. Also reviewed goal A1c with patient, importance of good glucose control @ home, and blood sugar goals. Reviewed path of DM, role of pancreas, potential impact of pseudocyst on beta cell production, hypo vs hyper glycemia, interventions, vascular changes, 70/30 [employer] relion, and commorbidities.  Patient will need meter at discharge. Reviewed recommended frequency for checking CBGs and when to follow up with PCP. TOC consult placed.  Admits to drinking sodas/ ETOH. Reviewed alternatives to sugary beverages, importance of protein and being mindful of CHO intake.  Of note, has intentionally lost 90+ lbs with intermittent fasting (3-5 day increments). Encouraged more frequent meals that are of balance, protein and minimum daily CHO alottments. Discussed starvation ketosis and  importance of daily meals. Educated patient on insulin pen use at home. Reviewed contents of insulin flexpen starter kit. Reviewed all steps of insulin pen including attachment of needle, 2-unit air shot, dialing up dose, giving injection, removing needle, disposal of sharps, storage of unused insulin, disposal of insulin etc. Patient able to provide successful return demonstration. Also reviewed troubleshooting with insulin pen. MD to give patient Rxs for insulin pens and insulin pen needles.  Thanks, Tinnie Minus, MSN, RNC-OB Diabetes Coordinator [phone] (8a-5p)

## 2024-08-16 NOTE — ED Notes (Signed)
 CBG 188, not crossing over from glucometer

## 2024-08-16 NOTE — Assessment & Plan Note (Addendum)
 Suspect primarily alcoholic/starvation ketoacidosis.  Hemoglobin A1c 8.0.  Ethanol level elevated 362.  Elevated AST: ALT >2.  Lactic acid now 5.5.  RUQ US  with hepatic steatosis. Lipid panel wnl.  - Repleted K with 40 meq - 500 mg thiamine  IV - Phos now - Discontinue insulin drip, ACHS moderate SSI - Continue IV fluids -1 more every 4 hours BMP, if gap still closed, AM CMP, mag, Phos - hepatitis panel

## 2024-08-16 NOTE — Assessment & Plan Note (Deleted)
 Lipase elevated initially at 276.  History of cholecystectomy. *** Inflammatory changes noted surrounding the midportion of the pancreas consistent with acute pancreatitis. This inflammatory change envelopment the second and third portions of the duodenum and extends along the anterior aspect of Gerota's fascia with more discrete collections identified. The largest of these measures approximately 4.0 x 1.7 cm likely related to a developing pseudocyst. -Continue LR at 150 mL/h - Continue clear liquid diet, advance as tolerated - Pain management: Will schedule Tylenol  650 mg every 6 hours, Oxycodone 5-10mg  Q6H PRN for moderate-severe pain  ***    - FOBT   - Consider GI consult if positive Treatment  - LR 134mL/hr    - Holding off on abx for now, consider restarting if fever occurs Monitoring  - BMP q4h  - cardiac telemetry - Protonix  IV 40mg  daily for potential GI bleed. Consider stopping if FOBT neg.

## 2024-08-16 NOTE — ED Notes (Signed)
 Patient moved onto hospital bed for comfort. Socks placed for fall risk. Patient taking off socks, refusing to wear.

## 2024-08-16 NOTE — ED Notes (Signed)
 Patient transported to MRI

## 2024-08-16 NOTE — Progress Notes (Signed)
 Daily Progress Note Intern Pager: [phone]  Patient name: Aaron Burke Medical record number: [id number] Date of birth: 21-Feb-1986 Age: 38 y.o. Gender: male  Primary Care Provider: Loretha Richerd SAUNDERS, MD Consultants: None Code Status: DNR  Pt Overview and Major Events to Date:  10/23-admitted  Assessment and Plan:  This is a 38 year old male with PMH PTSD, MDD, GERD, GAD, HLD presenting with nausea, vomiting, abdominal pain with recent poor p.o. intake, lipase greater than 3 times normal limits, elevated ethanol and history of significant alcohol use, and CT findings with pancreatic inflammation and likely developing pseudocyst suggestive of acute pancreatitis.  Also with elevated anion gap, and ketones in the urine in association with poor p.o. and significant alcohol use suspect starvation ketoacidosis; though glucose was elevated on admission at 326 and A1c is 8.0 could represent diabetic ketoacidosis, notably venous pH is alkalotic. Assessment & Plan Acute pancreatitis Patient with minimal abdominal pain, no nausea or vomiting this morning.  Lipase elevated initially at 276.  History of cholecystectomy. Inflammatory changes noted surrounding the midportion of the pancreas consistent with acute pancreatitis. This inflammatory change envelopment the second and third portions of the duodenum and extends along the anterior aspect of Gerota's fascia with more discrete collections identified. The largest of these measures approximately 4.0 x 1.7 cm likely related to a developing pseudocyst. - Continue LR at 150 mL/h - Continue clear liquid diet, advance diet as tolerated - Pain management: Will schedule Tylenol  650 mg every 6 hours, Oxycodone 5-10mg  Q6H PRN for moderate-severe pain  -EKG revealed prolonged QTc, and since patient is not nauseous we will not add any as needed antiemetics at this time Increased anion gap metabolic acidosis Suspect primarily alcoholic/starvation  ketoacidosis.  Hemoglobin A1c 8.0.  Ethanol level elevated 362.  Elevated AST: ALT >2.  Lactic acid now 5.5.  RUQ US  with hepatic steatosis. Lipid panel wnl.  - Repleted K with 40 meq - 500 mg thiamine  IV - Phos now - Discontinue insulin drip, ACHS moderate SSI - Continue IV fluids -1 more every 4 hours BMP, if gap still closed, AM CMP, mag, Phos - hepatitis panel Alcohol use disorder Has hx of alcohol withdrawal seizure. Last drink 10/23 AM.  - Continue CIWA's - d/c ativan  - Phenobarb taper - Vitamin supplementation per CIWA protocol - considering naltrexone, plans to detox inpatient Melena Reports 3 weeks of melena, no iron supplementation.  Hemoglobin within normal limits - Follow FOBT -Has not stooled, ordered glycerin suppository - Increase IV Protonix  to 40 mg twice daily Low back pain His primary concern is aching low back pain that on exam appears myofascial, tenderness over the left SI joint and right thoracic paraspinal musculature; however he also reports several days of leg weakness and is unable to lift his legs in bed.  Also notes new difficulties urinating, but has had 725 mL UOP this morning, so will not add bladder scans at this time. - Pain management as above - lumbar MRI - rpr, b12  - 500 mg thiamine  Chronic health problem Does not appear patient has picked up his psychiatric medications in the past several months. GERD - Protonix  40mg  daily adjusted as above Glycerin suppository  FEN/GI: Liquid diet PPx: Lovenox  Dispo:Home pending clinical improvement .   Subjective:  Reports aching back and leg pain.  No burning, numbness, tingling.  Pain is primarily lumbosacral.  Minimal abdominal pain.  No nausea or vomiting.  Objective: Temp:  [97.5 F (36.4 C)-98.4 F (36.9 C)] 98.4  F (36.9 C) (10/24 0610) Pulse Rate:  [98-136] 109 (10/24 0630) Resp:  [19-41] 26 (10/24 0630) BP: (93-156)/(61-112) 128/83 (10/24 0630) SpO2:  [89 %-100 %] 97 % (10/24  0630) Weight:  [66.7 kg] 66.7 kg (10/23 1755) Physical Exam: General: Slightly sweaty male lying on his side in bed, watching basketball Cardiovascular: Tachycardic, regular rhythm, no murmurs Respiratory: CTAB, breathing comfortably on room air Abdomen: Mild tenderness with guarding in the left lower quadrant and epigastrium, slight abdominal distention but abdomen is soft Lumbar: No spinous process tenderness, tenderness to palpation over the left SI joint, tenderness to palpation over the right thoracic paraspinal region, unable to lift his legs in bed but able to move them around and reposition himself  Laboratory: Most recent CBC Lab Results  Component Value Date   WBC 16.7 (H) 08/15/2024   HGB 16.3 08/15/2024   HCT 48.0 08/15/2024   MCV 93.4 08/15/2024   PLT 267 08/15/2024   Most recent BMP    Latest Ref Rng & Units 08/16/2024    3:53 AM  BMP  Glucose 70 - 99 mg/dL 823   BUN 6 - 20 mg/dL 5   Creatinine 9.38 - 8.75 mg/dL 9.53   Sodium 864 - 854 mmol/L 131   Potassium 3.5 - 5.1 mmol/L 3.4   Chloride 98 - 111 mmol/L 91   CO2 22 - 32 mmol/L 24   Calcium 8.9 - 10.3 mg/dL 7.0    Magnesium  1.9  Imaging/Diagnostic Tests: None Alena Morrison, Elio, MD 08/16/2024, 7:12 AM  PGY-1, [facility] FPTS Intern pager: [phone], text pages welcome Secure chat group [facility] Teaching Service

## 2024-08-16 NOTE — Assessment & Plan Note (Addendum)
 His primary concern is aching low back pain that on exam appears myofascial, tenderness over the left SI joint and right thoracic paraspinal musculature; however he also reports several days of leg weakness and is unable to lift his legs in bed.  Also notes new difficulties urinating, but has had 725 mL UOP this morning, so will not add bladder scans at this time. - Pain management as above - lumbar MRI - rpr, b12  - 500 mg thiamine 

## 2024-08-16 NOTE — Assessment & Plan Note (Addendum)
 Has hx of alcohol withdrawal seizure. Last drink 10/23 AM.  - Continue CIWA's - d/c ativan  - Phenobarb taper - Vitamin supplementation per CIWA protocol - considering naltrexone, plans to detox inpatient

## 2024-08-16 NOTE — ED Notes (Signed)
 CBG 248. Not crossing over from glucometer

## 2024-08-16 NOTE — Assessment & Plan Note (Addendum)
 Reports 3 weeks of melena, no iron supplementation.  Hemoglobin within normal limits - Follow FOBT -Has not stooled, ordered glycerin suppository - Increase IV Protonix  to 40 mg twice daily

## 2024-08-16 NOTE — Assessment & Plan Note (Addendum)
 Patient with minimal abdominal pain, no nausea or vomiting this morning.  Lipase elevated initially at 276.  History of cholecystectomy. Inflammatory changes noted surrounding the midportion of the pancreas consistent with acute pancreatitis. This inflammatory change envelopment the second and third portions of the duodenum and extends along the anterior aspect of Gerota's fascia with more discrete collections identified. The largest of these measures approximately 4.0 x 1.7 cm likely related to a developing pseudocyst. - Continue LR at 150 mL/h - Continue clear liquid diet, advance diet as tolerated - Pain management: Will schedule Tylenol  650 mg every 6 hours, Oxycodone 5-10mg  Q6H PRN for moderate-severe pain  -EKG revealed prolonged QTc, and since patient is not nauseous we will not add any as needed antiemetics at this time

## 2024-08-16 NOTE — Assessment & Plan Note (Addendum)
 Does not appear patient has picked up his psychiatric medications in the past several months. GERD - Protonix  40mg  daily adjusted as above Glycerin suppository

## 2024-08-16 NOTE — ED Notes (Signed)
 Checked patient cbg 48 notified RN of blood sugar patient is resting with call bell in reach

## 2024-08-17 LAB — BLOOD CULTURE ID PANEL (REFLEXED) - BCID2

## 2024-08-17 LAB — PHOSPHORUS
Phosphorus: 1.1 mg/dL — ABNORMAL LOW (ref 2.5–4.6)
Phosphorus: <1 mg/dL — CL (ref 2.5–4.6)

## 2024-08-17 LAB — COMPREHENSIVE METABOLIC PANEL WITH GFR
ALT: 159 U/L — ABNORMAL HIGH (ref 0–44)
AST: 461 U/L — ABNORMAL HIGH (ref 15–41)
Albumin: 2.1 g/dL — ABNORMAL LOW (ref 3.5–5.0)
Alkaline Phosphatase: 224 U/L — ABNORMAL HIGH (ref 38–126)
Anion gap: 12 (ref 5–15)
BUN: 5 mg/dL — ABNORMAL LOW (ref 6–20)
CO2: 26 mmol/L (ref 22–32)
Calcium: 7.7 mg/dL — ABNORMAL LOW (ref 8.9–10.3)
Chloride: 91 mmol/L — ABNORMAL LOW (ref 98–111)
Creatinine, Ser: 0.64 mg/dL (ref 0.61–1.24)
GFR, Estimated: >60 mL/min (ref >60)
Glucose, Bld: 224 mg/dL — ABNORMAL HIGH (ref 70–99)
Potassium: 3.8 mmol/L (ref 3.5–5.1)
Sodium: 129 mmol/L — ABNORMAL LOW (ref 135–145)
Total Bilirubin: 4.3 mg/dL — ABNORMAL HIGH (ref 0.0–1.2)
Total Protein: 5 g/dL — ABNORMAL LOW (ref 6.5–8.1)

## 2024-08-17 LAB — RPR: RPR Ser Ql: NONREACTIVE

## 2024-08-17 LAB — GLUCOSE, CAPILLARY
Glucose-Capillary: 137 mg/dL — ABNORMAL HIGH (ref 70–99)
Glucose-Capillary: 192 mg/dL — ABNORMAL HIGH (ref 70–99)
Glucose-Capillary: 148 mg/dL — ABNORMAL HIGH (ref 70–99)
Glucose-Capillary: 153 mg/dL — ABNORMAL HIGH (ref 70–99)

## 2024-08-17 LAB — HEPATITIS PANEL, ACUTE
HCV Ab: NONREACTIVE
Hep A IgM: NONREACTIVE
Hep B C IgM: NONREACTIVE
Hepatitis B Surface Ag: NONREACTIVE

## 2024-08-17 LAB — VITAMIN B12: Vitamin B-12: 696 pg/mL (ref 180–914)

## 2024-08-17 LAB — MAGNESIUM: Magnesium: 1.6 mg/dL — ABNORMAL LOW (ref 1.7–2.4)

## 2024-08-17 MED ORDER — POTASSIUM & SODIUM PHOSPHATES 280-160-250 MG PO PACK
1.0000 | PACK | Freq: Three times a day (TID) | ORAL | Status: DC
  Administered 2024-08-17 – 2024-08-20 (×10): 1 via ORAL
  Filled 2024-08-17 (×16): qty 1

## 2024-08-17 MED ORDER — OXYCODONE HCL 5 MG PO TABS
5.0000 mg | ORAL_TABLET | Freq: Four times a day (QID) | ORAL | Status: DC
Start: 2024-08-17 — End: 2024-08-20
  Administered 2024-08-17 – 2024-08-20 (×13): 5 mg via ORAL
  Filled 2024-08-17 (×13): qty 1

## 2024-08-17 MED ORDER — HYDROXYZINE HCL 25 MG PO TABS
25.0000 mg | ORAL_TABLET | Freq: Once | ORAL | Status: AC
  Administered 2024-08-17: 25 mg via ORAL
  Filled 2024-08-17: qty 1

## 2024-08-17 MED ORDER — ACETAMINOPHEN 650 MG RE SUPP: 650.0000 mg | Freq: Four times a day (QID) | RECTAL | Status: DC

## 2024-08-17 MED ORDER — POTASSIUM & SODIUM PHOSPHATES 280-160-250 MG PO PACK
2.0000 | PACK | Freq: Once | ORAL | Status: AC
  Administered 2024-08-17: 2 via ORAL
  Filled 2024-08-17: qty 2

## 2024-08-17 MED ORDER — OXYCODONE HCL 5 MG PO TABS
10.0000 mg | ORAL_TABLET | Freq: Four times a day (QID) | ORAL | Status: DC | PRN
  Administered 2024-08-18 – 2024-08-19 (×4): 10 mg via ORAL
  Filled 2024-08-17 (×4): qty 2

## 2024-08-17 MED ORDER — HYDROMORPHONE HCL 1 MG/ML IJ SOLN
0.5000 mg | INTRAMUSCULAR | Status: DC | PRN
  Administered 2024-08-17 – 2024-08-18 (×3): 0.5 mg via INTRAVENOUS
  Filled 2024-08-17 (×4): qty 0.5

## 2024-08-17 MED ORDER — ACETAMINOPHEN 500 MG PO TABS
1000.0000 mg | ORAL_TABLET | Freq: Four times a day (QID) | ORAL | Status: DC
  Administered 2024-08-17 – 2024-08-20 (×11): 1000 mg via ORAL
  Filled 2024-08-17 (×11): qty 2

## 2024-08-17 MED ORDER — ENSURE PLUS HIGH PROTEIN PO LIQD
237.0000 mL | Freq: Two times a day (BID) | ORAL | Status: DC
  Administered 2024-08-17: 237 mL via ORAL

## 2024-08-17 MED ORDER — PANTOPRAZOLE SODIUM 40 MG PO TBEC
40.0000 mg | DELAYED_RELEASE_TABLET | Freq: Two times a day (BID) | ORAL | Status: DC
  Administered 2024-08-17 – 2024-08-20 (×7): 40 mg via ORAL
  Filled 2024-08-17 (×7): qty 1

## 2024-08-17 NOTE — Progress Notes (Addendum)
 Initial Nutrition Assessment  DOCUMENTATION CODES:   Not applicable  INTERVENTION:   -Continue full liquid diet; RD will follow for diet advancement and adjust supplement regimen as appropriate  -Ensure Plus High Protein po BID, each supplement provides 350 kcal and 20 grams of protein  -MVI with minerals daily -Continue 1 mg folic acid daily -Continue 100 mg thiamine  daily -RD provided Plate Method and Carbohydrate Counting for People with Diabetes handouts from [facility] Nutrition Care Manual; attached to AVS/ discharge summary   NUTRITION DIAGNOSIS:   Increased nutrient needs related to acute illness as evidenced by estimated needs.  GOAL:   Patient will meet greater than or equal to 90% of their needs  MONITOR:   PO intake, Supplement acceptance  REASON FOR ASSESSMENT:   Consult Diet education  ASSESSMENT:   38 y.o. male presenting with abdominal pain. History and labs highly suggestive of alcoholic pancreatitis  Patient admitted with acute pancreatitis.   10/25- advanced to a full liquid diet  Reviewed I/O's: +1.7 L x 24 hours and +3.9 L since admission  UOP: 929 ml x 24 hours  Patient unavailable at time of visit. Attempted to speak with patient via call to hospital room phone, however, unable to reach. RD unable to obtain further nutrition-related history or complete nutrition-focused physical exam at this time.    Per MD notes, MRI revealed extensive retroperitoneal inflammation secondary to pancreatitis.   Noted patient was on insulin drip (d/c on 08/16/24) secondary to increased anion gap metabolic acidosis suspicious for alcoholic starvation ketoacidosis.   Per DM coordinator notes, patient has been drinking sugary beverages and alcohol PTA. He also endorses an intentional 90# weight loss related to intermittent fasting. Plan to discharge home on insulin. He is uninsured.     No recent weight history available to assess, but noted history of weight loss.  Patient is at high risk for malnutrition secondary to alcohol abuse and weight loss, however, unable to identify at this time.   Medications reviewed and include folic acid, protonix , thiamine , and potassium and sodium phosphates.   Lab Results  Component Value Date   HGBA1C 8.0 (H) 08/16/2024   PTA DM medications are none.   Labs reviewed: Na: 129, Phos: 1.1 (on PO supplementation), Mg: 1.6, CBGS: 152-233 (inpatient orders for glycemic control are  0-15 units insulin aspart TID with meals).    Diet Order:   Diet Order             Diet full liquid Room service appropriate? Yes; Fluid consistency: Thin  Diet effective now                   EDUCATION NEEDS:   No education needs have been identified at this time  Skin:  Skin Assessment: Reviewed RN Assessment  Last BM:  08/15/24  Height:   Ht Readings from Last 1 Encounters:  08/16/24 5' 9 (1.753 m)    Weight:   Wt Readings from Last 1 Encounters:  08/16/24 74.4 kg    Ideal Body Weight:  72.7 kg  BMI:  Body mass index is 24.22 kg/m.  Estimated Nutritional Needs:   Kcal:  2200-2400  Protein:  120-135 grams  Fluid:  2.0-2.2 L    Margery ORN, RD, LDN, CDCES Registered Dietitian III Certified Diabetes Care and Education Specialist If unable to reach this RD, please use RD Inpatient group chat on secure chat between hours of 8am-4 pm daily

## 2024-08-17 NOTE — Assessment & Plan Note (Signed)
 FOBT positive but considering normal hemoglobin levels will not pursue workup at this time, continue to monitor with plan as below -Continue oral Protonix  twice daily

## 2024-08-17 NOTE — Evaluation (Signed)
 Physical Therapy Evaluation Patient Details Name: Aaron Burke MRN: [id number] DOB: April 04, 1986 Today's Date: 08/17/2024  History of Present Illness  This is a 38 year old male admitted 10/23 presenting with nausea vomiting and abdominal pain found to be acute pancreatitis.  Given his significant recent alcohol use suspect this is alcoholic pancreatitis in the setting of major depressive episode. PMH including PTSD, MDD, GERD, GAD, HLD  Clinical Impression  Pt admitted with above diagnosis. Pt was able to ambulate with slow and guarded gait. May need RW - pt didn't want to use today but was needing steadying support and reaching for furniture. Pt reports bil LE pain. Will follow acutely and progress pt as able.  Pt currently with functional limitations due to the deficits listed below (see PT Problem List). Pt will benefit from acute skilled PT to increase their independence and safety with mobility to allow discharge.           If plan is discharge home, recommend the following: A little help with walking and/or transfers;A little help with bathing/dressing/bathroom;Assistance with cooking/housework;Assist for transportation;Help with stairs or ramp for entrance   Can travel by private vehicle        Equipment Recommendations Rolling walker (2 wheels)  Recommendations for Other Services       Functional Status Assessment Patient has had a recent decline in their functional status and demonstrates the ability to make significant improvements in function in a reasonable and predictable amount of time.     Precautions / Restrictions Precautions Precautions: Fall Restrictions Weight Bearing Restrictions Per Provider Order: No      Mobility  Bed Mobility Overal bed mobility: Independent             General bed mobility comments: slow moving due to pain but no assist    Transfers Overall transfer level: Needs assistance Equipment used: None Transfers: Sit to/from  Stand Sit to Stand: Contact guard assist           General transfer comment: Needs guard assist as he is slow to power up due to pain.    Ambulation/Gait Ambulation/Gait assistance: Contact guard assist Gait Distance (Feet): 15 Feet (15x 2) Assistive device: Standard walker, Bilateral platform walker, None Gait Pattern/deviations: Step-through pattern, Decreased stride length, Trunk flexed, Drifts right/left, Wide base of support, Antalgic   Gait velocity interpretation: <1.31 ft/sec, indicative of household ambulator   General Gait Details: Pt with shortened step length, moves slowly due to pain.  Reaches for furniture but refused RW.  Not grossly unsteady but slow and guarded gait. walked to bathroom and urinated and then back to bed.  Declined sitting up in chair.  Stairs            Wheelchair Mobility     Tilt Bed    Modified Rankin (Stroke Patients Only)       Balance Overall balance assessment: Needs assistance Sitting-balance support: No upper extremity supported, Feet supported Sitting balance-Leahy Scale: Good     Standing balance support: No upper extremity supported, During functional activity Standing balance-Leahy Scale: Fair                               Pertinent Vitals/Pain Pain Assessment Pain Assessment: Faces Faces Pain Scale: Hurts whole lot Pain Location: bil Les Pain Descriptors / Indicators: Discomfort, Grimacing, Guarding, Aching Pain Intervention(s): Limited activity within patient's tolerance, Monitored during session, Repositioned    Home Living Family/patient expects to be  discharged to:: Private residence Living Arrangements: Alone Available Help at Discharge: Family;Available PRN/intermittently Type of Home: Apartment Home Access: Stairs to enter Entrance Stairs-Rails: Doctor, General Practice of Steps: 9   Home Layout: One level Home Equipment: None      Prior Function Prior Level of Function :  Independent/Modified Independent                     Extremity/Trunk Assessment   Upper Extremity Assessment Upper Extremity Assessment: Defer to OT evaluation    Lower Extremity Assessment Lower Extremity Assessment: Generalized weakness;RLE deficits/detail;LLE deficits/detail RLE: Unable to fully assess due to pain LLE: Unable to fully assess due to pain    Cervical / Trunk Assessment Cervical / Trunk Assessment: Normal  Communication   Communication Communication: No apparent difficulties    Cognition Arousal: Alert Behavior During Therapy: WFL for tasks assessed/performed   PT - Cognitive impairments: No apparent impairments                         Following commands: Intact       Cueing       General Comments General comments (skin integrity, edema, etc.): HR 120's, need3ed assist to don socks    Exercises     Assessment/Plan    PT Assessment Patient needs continued PT services  PT Problem List Decreased activity tolerance;Decreased balance;Decreased mobility;Decreased knowledge of use of DME;Decreased safety awareness;Decreased knowledge of precautions       PT Treatment Interventions DME instruction;Gait training;Functional mobility training;Therapeutic activities;Therapeutic exercise;Balance training;Patient/family education;Stair training    PT Goals (Current goals can be found in the Care Plan section)  Acute Rehab PT Goals Patient Stated Goal: to gohome PT Goal Formulation: With patient Time For Goal Achievement: 08/31/24 Potential to Achieve Goals: Good    Frequency Min 2X/week     Co-evaluation               AM-PAC PT 6 Clicks Mobility  Outcome Measure Help needed turning from your back to your side while in a flat bed without using bedrails?: None Help needed moving from lying on your back to sitting on the side of a flat bed without using bedrails?: None Help needed moving to and from a bed to a chair  (including a wheelchair)?: A Little Help needed standing up from a chair using your arms (e.g., wheelchair or bedside chair)?: A Little Help needed to walk in hospital room?: Total Help needed climbing 3-5 steps with a railing? : Total 6 Click Score: 16    End of Session   Activity Tolerance: Patient limited by fatigue Patient left: in bed;with call bell/phone within reach;with bed alarm set Nurse Communication: Mobility status PT Visit Diagnosis: Unsteadiness on feet (R26.81);Muscle weakness (generalized) (M62.81);Pain Pain - Right/Left:  (bil) Pain - part of body: Leg;Knee;Hip;Ankle and joints of foot    Time: 1415-1442 PT Time Calculation (min) (ACUTE ONLY): 27 min   Charges:   PT Evaluation $PT Eval Moderate Complexity: 1 Mod PT Treatments $Gait Training: 8-22 mins PT General Charges $$ ACUTE PT VISIT: 1 Visit         Charice Zuno M,PT Acute Rehab Services [phone]   [name] 08/17/2024, 3:31 PM

## 2024-08-17 NOTE — Assessment & Plan Note (Addendum)
 Anion gap now normalized x 2.  Patient still has multiple electrolyte abnormalities including sodium phosphorus and magnesium .  Suspect some degree of refeeding syndrome, will replete as appropriate and continue to monitor. - s/p 500 mg thiamine  IV - continue PO thiamine  100mg  daily - daily multivitamin - daily folic acid 1mg  - PhosNak now, will plan to repeat CMP this afternoon and tomorrow morning for further monitoring - Moderate SSI with ACHS, plan to add basal insulin as able. - hepatitis panel and RPR pending

## 2024-08-17 NOTE — Assessment & Plan Note (Signed)
 Does not appear patient has picked up his psychiatric medications in the past several months. GERD - Protonix  40mg  daily adjusted as above Glycerin suppository

## 2024-08-17 NOTE — Progress Notes (Signed)
 Daily Progress Note Intern Pager: [phone]  Patient name: Aaron Burke Medical record number: [id number] Date of birth: [birth year] Age: 38 y.o. Gender: male  Primary Care Provider: Loretha Richerd SAUNDERS, MD Consultants: None Code Status: DNR  Pt Overview and Major Events to Date:  10/23- admitted  Assessment and Plan:  This is a 38 year old male with a PMH including PTSD, MDD, GERD, GAD, HLD presenting with nausea vomiting and abdominal pain found to be acute pancreatitis.  Given his significant recent alcohol use suspect this is alcoholic pancreatitis in the setting of major depressive episode. Assessment & Plan Acute pancreatitis Low back pain Patient remains stable on plan as below.  Repeat LFTs today significantly elevated compared to admission, question whether this is related to malnutrition versus alcoholic liver disease versus other etiology. - Full liquid diet this AM, advance diet as tolerated - Pain management:    -increase scheduled tylenol  to 1000mg  q6h  -schedule 5 mg oxycodone -continue q6h PRN oxy 10 mg, with dilaudid  0.5 mg q6h PRN for severe pain - MRI showed extensive retroperitoneal inflammation likely related to his pancreatitis, continue pain control as above -PT/OT eval and treat for increased mobilization Increased anion gap metabolic acidosis Anion gap now normalized x 2.  Patient still has multiple electrolyte abnormalities including sodium phosphorus and magnesium .  Suspect some degree of refeeding syndrome, will replete as appropriate and continue to monitor. - s/p 500 mg thiamine  IV - continue PO thiamine  100mg  daily - daily multivitamin - daily folic acid 1mg  - PhosNak now, will plan to repeat CMP this afternoon and tomorrow morning for further monitoring - Moderate SSI with ACHS, plan to add basal insulin as able. - hepatitis panel and RPR pending Alcohol use disorder CIWA's between 3 and 5 overnight, continue plan as below - Continue  CIWA's - Phenobarb taper - Vitamin supplementation per CIWA protocol - considering naltrexone, plans to detox inpatient Melena FOBT positive but considering normal hemoglobin levels will not pursue workup at this time, continue to monitor with plan as below -Continue oral Protonix  twice daily Chronic health problem Does not appear patient has picked up his psychiatric medications in the past several months. GERD - Protonix  40mg  daily adjusted as above Glycerin suppository  FEN/GI: Full liquids, advance as tolerated PPx: Lovenox  Dispo:Home pending clinical improvement .  Subjective:  Resting comfortably in bed on exam this morning.  Reports ongoing pain in his back and legs, but does report that he was able to get up and walk without help today.  Subjectively he feels that his pain and weakness are worse than when he came in.  He does endorse improved appetite.  Objective: Temp:  [97.7 F (36.5 C)-100.5 F (38.1 C)] 100.5 F (38.1 C) (10/25 0722) Pulse Rate:  [99-113] 107 (10/25 0722) Resp:  [13-24] 17 (10/25 0722) BP: (109-157)/(71-105) 126/76 (10/25 0722) SpO2:  [92 %-99 %] 95 % (10/25 0722) Weight:  [74.4 kg] 74.4 kg (10/24 1654) Physical Exam: General: Well appearing, no distress Cardiovascular: RRR, no m/r/g Respiratory: CTAB no increased WOB Abdomen: Flat, soft. Mild tenderness in the epigastrum Extremities: warm, well perfused.  Equal sensation bilaterally, 5 out of 5 strength bilaterally.  Movement does exacerbate pain in patient's back consistent with psoas inflammation seen on imaging.  Laboratory: Most recent CBC Lab Results  Component Value Date   WBC 16.7 (H) 08/15/2024   HGB 16.3 08/15/2024   HCT 48.0 08/15/2024   MCV 93.4 08/15/2024   PLT 267 08/15/2024   Most  recent BMP    Latest Ref Rng & Units 08/17/2024    2:36 AM  BMP  Glucose 70 - 99 mg/dL 775   BUN 6 - 20 mg/dL 5   Creatinine 9.38 - 8.75 mg/dL 9.35   Sodium 864 - 854 mmol/L 129   Potassium  3.5 - 5.1 mmol/L 3.8   Chloride 98 - 111 mmol/L 91   CO2 22 - 32 mmol/L 26   Calcium 8.9 - 10.3 mg/dL 7.7     Cleotilde Lukes, DO 08/17/2024, 7:25 AM  PGY-2, [facility] FPTS Intern pager: [phone], text pages welcome Secure chat group [facility] Teaching Service

## 2024-08-17 NOTE — Discharge Instructions (Signed)

## 2024-08-17 NOTE — Assessment & Plan Note (Addendum)
 Patient remains stable on plan as below.  Repeat LFTs today significantly elevated compared to admission, question whether this is related to malnutrition versus alcoholic liver disease versus other etiology. - Full liquid diet this AM, advance diet as tolerated - Pain management:    -increase scheduled tylenol  to 1000mg  q6h  -schedule 5 mg oxycodone -continue q6h PRN oxy 10 mg, with dilaudid  0.5 mg q6h PRN for severe pain - MRI showed extensive retroperitoneal inflammation likely related to his pancreatitis, continue pain control as above -PT/OT eval and treat for increased mobilization

## 2024-08-17 NOTE — Assessment & Plan Note (Signed)
 CIWA's between 3 and 5 overnight, continue plan as below - Continue CIWA's - Phenobarb taper - Vitamin supplementation per CIWA protocol - considering naltrexone, plans to detox inpatient

## 2024-08-17 NOTE — Progress Notes (Signed)
 PHARMACY - PHYSICIAN COMMUNICATION CRITICAL VALUE ALERT - BLOOD CULTURE IDENTIFICATION (BCID)  Aaron Burke is an 38 y.o. male who presented to [facility] on 08/15/2024 with a chief complaint of N/V/abdominal pain found to have acute pancreatitis.   Assessment:  1/4 blood cultures with staph spp (no R) -afebrile, WBC elevated -Imaging showing acute pancreatitis   Name of physician (or Provider) Contacted: Dr. Elicia  Current antibiotics: N/A  Changes to prescribed antibiotics recommended:   -Continue to monitor patient off antibiotics given contaminate   Results for orders placed or performed during the hospital encounter of 08/15/24  Blood Culture ID Panel (Reflexed) (Collected: 08/15/2024  7:00 PM)  Result Value Ref Range   Enterococcus faecalis NOT DETECTED NOT DETECTED   Enterococcus Faecium NOT DETECTED NOT DETECTED   Listeria monocytogenes NOT DETECTED NOT DETECTED   Staphylococcus species DETECTED (A) NOT DETECTED   Staphylococcus aureus (BCID) NOT DETECTED NOT DETECTED   Staphylococcus epidermidis NOT DETECTED NOT DETECTED   Staphylococcus lugdunensis NOT DETECTED NOT DETECTED   Streptococcus species NOT DETECTED NOT DETECTED   Streptococcus agalactiae NOT DETECTED NOT DETECTED   Streptococcus pneumoniae NOT DETECTED NOT DETECTED   Streptococcus pyogenes NOT DETECTED NOT DETECTED   A.calcoaceticus-baumannii NOT DETECTED NOT DETECTED   Bacteroides fragilis NOT DETECTED NOT DETECTED   Enterobacterales NOT DETECTED NOT DETECTED   Enterobacter cloacae complex NOT DETECTED NOT DETECTED   Escherichia coli NOT DETECTED NOT DETECTED   Klebsiella aerogenes NOT DETECTED NOT DETECTED   Klebsiella oxytoca NOT DETECTED NOT DETECTED   Klebsiella pneumoniae NOT DETECTED NOT DETECTED   Proteus species NOT DETECTED NOT DETECTED   Salmonella species NOT DETECTED NOT DETECTED   Serratia marcescens NOT DETECTED NOT DETECTED   Haemophilus influenzae NOT DETECTED NOT DETECTED    Neisseria meningitidis NOT DETECTED NOT DETECTED   Pseudomonas aeruginosa NOT DETECTED NOT DETECTED   Stenotrophomonas maltophilia NOT DETECTED NOT DETECTED   Candida albicans NOT DETECTED NOT DETECTED   Candida auris NOT DETECTED NOT DETECTED   Candida glabrata NOT DETECTED NOT DETECTED   Candida krusei NOT DETECTED NOT DETECTED   Candida parapsilosis NOT DETECTED NOT DETECTED   Candida tropicalis NOT DETECTED NOT DETECTED   Cryptococcus neoformans/gattii NOT DETECTED NOT DETECTED    Lynwood Poplar, PharmD, BCPS Clinical Pharmacist 08/17/2024 12:45 AM

## 2024-08-18 DIAGNOSIS — E119 Type 2 diabetes mellitus without complications: Secondary | ICD-10-CM

## 2024-08-18 DIAGNOSIS — E878 Other disorders of electrolyte and fluid balance, not elsewhere classified: Secondary | ICD-10-CM | POA: Insufficient documentation

## 2024-08-18 DIAGNOSIS — T730XXA Starvation, initial encounter: Secondary | ICD-10-CM

## 2024-08-18 LAB — GLUCOSE, CAPILLARY
Glucose-Capillary: 205 mg/dL — ABNORMAL HIGH (ref 70–99)
Glucose-Capillary: 149 mg/dL — ABNORMAL HIGH (ref 70–99)
Glucose-Capillary: 153 mg/dL — ABNORMAL HIGH (ref 70–99)
Glucose-Capillary: 121 mg/dL — ABNORMAL HIGH (ref 70–99)

## 2024-08-18 LAB — COMPREHENSIVE METABOLIC PANEL WITH GFR
ALT: 137 U/L — ABNORMAL HIGH (ref 0–44)
AST: 262 U/L — ABNORMAL HIGH (ref 15–41)
Albumin: 1.8 g/dL — ABNORMAL LOW (ref 3.5–5.0)
Alkaline Phosphatase: 243 U/L — ABNORMAL HIGH (ref 38–126)
Anion gap: 9 (ref 5–15)
BUN: <5 mg/dL — ABNORMAL LOW (ref 6–20)
CO2: 30 mmol/L (ref 22–32)
Calcium: 7.7 mg/dL — ABNORMAL LOW (ref 8.9–10.3)
Chloride: 93 mmol/L — ABNORMAL LOW (ref 98–111)
Creatinine, Ser: 0.57 mg/dL — ABNORMAL LOW (ref 0.61–1.24)
GFR, Estimated: >60 mL/min (ref >60)
Glucose, Bld: 123 mg/dL — ABNORMAL HIGH (ref 70–99)
Potassium: 3.4 mmol/L — ABNORMAL LOW (ref 3.5–5.1)
Sodium: 132 mmol/L — ABNORMAL LOW (ref 135–145)
Total Bilirubin: 4 mg/dL — ABNORMAL HIGH (ref 0.0–1.2)
Total Protein: 4.6 g/dL — ABNORMAL LOW (ref 6.5–8.1)

## 2024-08-18 LAB — CBC
HCT: 32.2 % — ABNORMAL LOW (ref 39.0–52.0)
Hemoglobin: 11.1 g/dL — ABNORMAL LOW (ref 13.0–17.0)
MCH: 32.7 pg (ref 26.0–34.0)
MCHC: 34.5 g/dL (ref 30.0–36.0)
MCV: 95 fL (ref 80.0–100.0)
Platelets: 167 K/uL (ref 150–400)
RBC: 3.39 MIL/uL — ABNORMAL LOW (ref 4.22–5.81)
RDW: 13.1 % (ref 11.5–15.5)
WBC: 12.2 K/uL — ABNORMAL HIGH (ref 4.0–10.5)
nRBC: 0 % (ref 0.0–0.2)

## 2024-08-18 LAB — CULTURE, BLOOD (ROUTINE X 2)

## 2024-08-18 LAB — MAGNESIUM: Magnesium: 1.7 mg/dL (ref 1.7–2.4)

## 2024-08-18 LAB — PHOSPHORUS: Phosphorus: 1.4 mg/dL — ABNORMAL LOW (ref 2.5–4.6)

## 2024-08-18 MED ORDER — POTASSIUM CHLORIDE 20 MEQ PO PACK
40.0000 meq | PACK | Freq: Once | ORAL | Status: AC
  Administered 2024-08-18: 40 meq via ORAL
  Filled 2024-08-18: qty 2

## 2024-08-18 MED ORDER — ONDANSETRON 4 MG PO TBDP
4.0000 mg | ORAL_TABLET | Freq: Three times a day (TID) | ORAL | Status: DC | PRN
  Administered 2024-08-20: 4 mg via ORAL
  Filled 2024-08-18: qty 1

## 2024-08-18 MED ORDER — GLYCERIN (LAXATIVE) 2 G RE SUPP
1.0000 | Freq: Once | RECTAL | Status: AC
Start: 2024-08-19 — End: 2024-08-18
  Administered 2024-08-18: 1 via RECTAL
  Filled 2024-08-18: qty 1

## 2024-08-18 NOTE — Assessment & Plan Note (Addendum)
  Patient ate breakfast well this morning with some mild nausea and is asking for more food and soda.  No abdominal or thoracic back pain.  Primarily reports low back and leg pain, that has been chronic. EKG with appropriate Qtc this morning.  -zofran  4 mg ODT q8h PRN -Progress to soft diet - Continue scheduled Tylenol  and oxycodone 5 mg, continue as needed 10 mg oxycodone, discontinue as needed Dilaudid

## 2024-08-18 NOTE — Assessment & Plan Note (Addendum)
 Phosphorus, potassium low this morning. AST/ALT improved this AM. Hepatitis panel, RPR nonreactive.  - AM CMP, Mg, Phos - additional K 40 meq supplementation this morning as only 7 meq in scheduled supplement - continue scheduled phos-NaK supplement - continue other supplements per CIWA protocol

## 2024-08-18 NOTE — Plan of Care (Signed)

## 2024-08-18 NOTE — Assessment & Plan Note (Addendum)
 GERD - Protonix  40mg  daily adjusted as above

## 2024-08-18 NOTE — Assessment & Plan Note (Signed)
 Phosphorus, potassium low this morning. AST/ALT improved this AM. Hepatitis panel, RPR nonreactive.  - AM CMP, Mg, Phos - additional K 40 meq supplementation this morning as only 7 meq in scheduled supplement - continue scheduled phos-NaK supplement - continue other supplements per CIWA protocol

## 2024-08-18 NOTE — Plan of Care (Signed)
   Problem: Education: Goal: Ability to describe self-care measures that may prevent or decrease complications (Diabetes Survival Skills Education) will improve Outcome: Progressing

## 2024-08-18 NOTE — Assessment & Plan Note (Addendum)
 CIWA's less than 3 overnight, but slightly tachycardic and increasingly hypertensive this morning. Today is 72 hours post last drink - Continue CIWA's - Continue Phenobarb taper - Vitamin supplementation per CIWA protocol - considering naltrexone, plans to detox inpatient

## 2024-08-18 NOTE — Assessment & Plan Note (Addendum)
 Glucose ranged from 123-205 yesterday.  8 units aspart given.  New diagnosis DM this admission. - Continue moderate SSI ACHS

## 2024-08-18 NOTE — Plan of Care (Signed)
 Received page from RN that patient's pain was 9/10 and he was requesting oxycodone.   Went to assess patient. He reports his pain is in a different location more located around the umbilical area. This pain feels different from his presenting pain, however it has responded to oxycodone.   Patient is at risk for complications from pancreatitis. Since pain has improved with oxycodone, will hold on repeat CT AP.   If pain becomes worse and does not respond to current pain meds, will order CTAP.   Damien Pinal, DO [facility], PGY-3 08/18/24 9:23 PM

## 2024-08-18 NOTE — Progress Notes (Signed)
 Daily Progress Note Intern Pager: [phone]  Patient name: Aaron Burke Medical record number: [id number] Date of birth: April 29, 1986 Age: 38 y.o. Gender: male  Primary Care Provider: Loretha Richerd SAUNDERS, MD Consultants: none Code Status: DNR  Pt Overview and Major Events to Date:  10/23-admitted  Assessment and Plan:  This is a 38 year old male with a PMH including PTSD, MDD, GERD, GAD, HLD who presented with poor p.o., nausea, vomiting, and abdominal pain secondary to acute pancreatitis, also presenting with alcoholic/starvation ketoacidosis. Assessment & Plan Acute pancreatitis Low back pain   Patient ate breakfast well this morning with some mild nausea and is asking for more food and soda.  No abdominal or thoracic back pain.  Primarily reports low back and leg pain, that has been chronic. EKG with appropriate Qtc this morning.  -zofran  4 mg ODT q8h PRN -Progress to soft diet - Continue scheduled Tylenol  and oxycodone 5 mg, continue as needed 10 mg oxycodone, discontinue as needed Dilaudid  Increased anion gap metabolic acidosis (Resolved: 08/18/2024) Refeeding syndrome Starvation ketoacidosis (Resolved: 08/18/2024) Phosphorus, potassium low this morning. AST/ALT improved this AM. Hepatitis panel, RPR nonreactive.  - AM CMP, Mg, Phos - additional K 40 meq supplementation this morning as only 7 meq in scheduled supplement - continue scheduled phos-NaK supplement - continue other supplements per CIWA protocol Diabetes mellitus without complication (HCC) Glucose ranged from 123-205 yesterday.  8 units aspart given.  New diagnosis DM this admission. - Continue moderate SSI ACHS Alcohol use disorder CIWA's less than 3 overnight, but slightly tachycardic and increasingly hypertensive this morning. Today is 72 hours post last drink - Continue CIWA's - Continue Phenobarb taper - Vitamin supplementation per CIWA protocol - considering naltrexone, plans to detox  inpatient Melena FOBT positive with hgb drop from 16s to 11.1 this AM.  Patient is asymptomatic.  Question if his initial hemoglobin was hemoconcentrated with poor p.o. intake and now diluted, with drops in all cell lines, with fluid resuscitation.  Will continue to monitor - AM CBC -Continue oral Protonix  twice daily Chronic health problem GERD - Protonix  40mg  daily adjusted as above  FEN/GI: Soft PPx: Lovenox  Dispo:Home pending clinical improvement .   Subjective:  Patient with minimal nausea after breakfast.  No vomiting.  Sleeping well.  Concerns of low back and leg pain.  Objective: Temp:  [97.6 F (36.4 C)-100.8 F (38.2 C)] 99.1 F (37.3 C) (10/26 1102) Pulse Rate:  [88-110] 108 (10/26 1102) Resp:  [15-20] 19 (10/26 1102) BP: (126-149)/(85-101) 136/86 (10/26 1102) SpO2:  [94 %-98 %] 95 % (10/26 1102) Physical Exam: General: Comfortable appearing male sleeping in bed Cardiovascular: Regular rhythm, no murmurs Respiratory: CTAB, breathing comfortably on room air Abdomen: Soft, mild tenderness to palpation with guarding diffusely Extremities: Strength with straight leg raise 4+ out of 5 bilaterally  Laboratory: Most recent CBC Lab Results  Component Value Date   WBC 12.2 (H) 08/18/2024   HGB 11.1 (L) 08/18/2024   HCT 32.2 (L) 08/18/2024   MCV 95.0 08/18/2024   PLT 167 08/18/2024   Most recent BMP    Latest Ref Rng & Units 08/18/2024    4:22 AM  BMP  Glucose 70 - 99 mg/dL 876   BUN 6 - 20 mg/dL <5   Creatinine 9.38 - 1.24 mg/dL 9.42   Sodium 864 - 854 mmol/L 132   Potassium 3.5 - 5.1 mmol/L 3.4   Chloride 98 - 111 mmol/L 93   CO2 22 - 32 mmol/L 30   Calcium  8.9 - 10.3 mg/dL 7.7    AST/ALT 737/862  Imaging/Diagnostic Tests:  EKG sinus tachycardia.  No PR or QTc prolongation  Alena Morrison, [place], MD 08/18/2024, 12:17 PM  PGY-1, [facility] FPTS Intern pager: [phone], text pages welcome Secure chat group [facility] Teaching Service

## 2024-08-18 NOTE — Assessment & Plan Note (Addendum)
 FOBT positive with hgb drop from 16s to 11.1 this AM.  Patient is asymptomatic.  Question if his initial hemoglobin was hemoconcentrated with poor p.o. intake and now diluted, with drops in all cell lines, with fluid resuscitation.  Will continue to monitor - AM CBC -Continue oral Protonix  twice daily

## 2024-08-19 LAB — GLUCOSE, CAPILLARY
Glucose-Capillary: 221 mg/dL — ABNORMAL HIGH (ref 70–99)
Glucose-Capillary: 143 mg/dL — ABNORMAL HIGH (ref 70–99)
Glucose-Capillary: 142 mg/dL — ABNORMAL HIGH (ref 70–99)
Glucose-Capillary: 119 mg/dL — ABNORMAL HIGH (ref 70–99)

## 2024-08-19 LAB — COMPREHENSIVE METABOLIC PANEL WITH GFR
ALT: 109 U/L — ABNORMAL HIGH (ref 0–44)
AST: 182 U/L — ABNORMAL HIGH (ref 15–41)
Albumin: 1.8 g/dL — ABNORMAL LOW (ref 3.5–5.0)
Alkaline Phosphatase: 298 U/L — ABNORMAL HIGH (ref 38–126)
Anion gap: 11 (ref 5–15)
BUN: <5 mg/dL — ABNORMAL LOW (ref 6–20)
CO2: 26 mmol/L (ref 22–32)
Calcium: 7.5 mg/dL — ABNORMAL LOW (ref 8.9–10.3)
Chloride: 90 mmol/L — ABNORMAL LOW (ref 98–111)
Creatinine, Ser: 0.5 mg/dL — ABNORMAL LOW (ref 0.61–1.24)
GFR, Estimated: >60 mL/min (ref >60)
Glucose, Bld: 173 mg/dL — ABNORMAL HIGH (ref 70–99)
Potassium: 3.3 mmol/L — ABNORMAL LOW (ref 3.5–5.1)
Sodium: 127 mmol/L — ABNORMAL LOW (ref 135–145)
Total Bilirubin: 3.7 mg/dL — ABNORMAL HIGH (ref 0.0–1.2)
Total Protein: 4.7 g/dL — ABNORMAL LOW (ref 6.5–8.1)

## 2024-08-19 LAB — PHOSPHORUS: Phosphorus: 1.3 mg/dL — ABNORMAL LOW (ref 2.5–4.6)

## 2024-08-19 LAB — MAGNESIUM: Magnesium: 1.6 mg/dL — ABNORMAL LOW (ref 1.7–2.4)

## 2024-08-19 MED ORDER — PHENOBARBITAL 32.4 MG PO TABS: 32.4000 mg | ORAL_TABLET | Freq: Three times a day (TID) | ORAL | Status: DC

## 2024-08-19 MED ORDER — POTASSIUM CHLORIDE 20 MEQ PO PACK
40.0000 meq | PACK | Freq: Four times a day (QID) | ORAL | Status: AC
  Administered 2024-08-19 (×2): 40 meq via ORAL
  Filled 2024-08-19 (×2): qty 2

## 2024-08-19 MED ORDER — LORAZEPAM 1 MG PO TABS
1.0000 mg | ORAL_TABLET | ORAL | Status: DC | PRN
  Administered 2024-08-19: 1 mg via ORAL
  Filled 2024-08-19: qty 1

## 2024-08-19 MED ORDER — PHENOBARBITAL 32.4 MG PO TABS
32.4000 mg | ORAL_TABLET | Freq: Four times a day (QID) | ORAL | Status: DC
  Administered 2024-08-19 – 2024-08-20 (×4): 32.4 mg via ORAL
  Filled 2024-08-19 (×4): qty 1

## 2024-08-19 MED ORDER — LORAZEPAM 2 MG/ML IJ SOLN: 1.0000 mg | INTRAMUSCULAR | Status: DC | PRN

## 2024-08-19 MED ORDER — PHENOBARBITAL 32.4 MG PO TABS
32.4000 mg | ORAL_TABLET | Freq: Four times a day (QID) | ORAL | Status: DC | PRN
  Administered 2024-08-20: 32.4 mg via ORAL
  Filled 2024-08-19: qty 1

## 2024-08-19 MED ORDER — LORAZEPAM 1 MG PO TABS
1.0000 mg | ORAL_TABLET | Freq: Once | ORAL | Status: DC
Start: 2024-08-19 — End: 2024-08-19

## 2024-08-19 MED ORDER — MAGNESIUM SULFATE 2 GM/50ML IV SOLN
2.0000 g | Freq: Once | INTRAVENOUS | Status: AC
  Administered 2024-08-19: 2 g via INTRAVENOUS
  Filled 2024-08-19: qty 50

## 2024-08-19 MED ORDER — OXYCODONE HCL 5 MG PO TABS
10.0000 mg | ORAL_TABLET | Freq: Three times a day (TID) | ORAL | Status: DC | PRN
Start: 2024-08-19 — End: 2024-08-20
  Administered 2024-08-19 (×2): 10 mg via ORAL
  Filled 2024-08-19 (×2): qty 2

## 2024-08-19 NOTE — Progress Notes (Signed)
 Daily Progress Note Intern Pager: [phone]  Patient name: Aaron Burke Medical record number: [id number] Date of birth: July 18, 1986 Age: 38 y.o. Gender: male  Primary Care Provider: Loretha Richerd SAUNDERS, MD Consultants: None Code Status: DNR   Pt Overview and Major Events to Date:  10/23-admitted   Assessment and Plan:   This is a 38 year old male with a PMH including PTSD, MDD, GERD, GAD, HLD who presented with poor p.o., nausea, vomiting, and abdominal pain secondary to acute pancreatitis, also presenting with alcoholic/starvation ketoacidosis. Assessment & Plan Acute pancreatitis Low back pain   Patient eating food well.  Ongoing abdominal pain.  -zofran  4 mg ODT q8h PRN -Progress to soft diet - Continue scheduled Tylenol  and oxycodone 5 mg, continue as needed 10 mg oxycodone. Refeeding syndrome Mag and Phos repleted this morning. AST/ALT improved this AM. Hepatitis panel, RPR nonreactive.  - AM CMP, Mg, Phos - continue scheduled phos-NaK supplement - continue other supplements per CIWA protocol Diabetes mellitus without complication (HCC) New diagnosis DM this admission.  - Continue moderate SSI ACHS Alcohol use disorder Has had diaphoresis, shaking, and anxiety. Is on Ativan  at baseline. Last drink was 10/23 AM. - Continue CIWA's - Increasing Phenobarb taper to 32.4mg  4x daily with 32.4mg  PRN for CIWA >5 - Vitamin supplementation per CIWA protocol - considering naltrexone, plans to detox inpatient Melena FOBT positive with hgb drop from 16s to 11.1 this AM.  Patient is asymptomatic.  Question if his initial hemoglobin was hemoconcentrated with poor p.o. intake and now diluted, with drops in all cell lines, with fluid resuscitation.  Will continue to monitor - AM CBC -Continue oral Protonix  40mg  twice daily Chronic health problem GERD - Protonix  as above  FEN/GI: Soft PPx: Lovenox  Dispo:Home pending clinical improvement .   Subjective:  Pt has been eating  well, but continues to have ongoing abdominal pain. Reports diaphoresis and tremors. Reports ongoing anxiety and would like to something to help with the anxiety.  Objective: Temp:  [98.3 F (36.8 C)-99.1 F (37.3 C)] 98.8 F (37.1 C) (10/27 1152) Pulse Rate:  [83-106] 97 (10/27 1112) Resp:  [15-20] 18 (10/27 1152) BP: (119-137)/(76-95) 131/95 (10/27 1152) SpO2:  [97 %-100 %] 97 % (10/27 1152)  Physical Exam Vitals and nursing note reviewed.  Constitutional:      General: He is not in acute distress.    Appearance: He is not toxic-appearing.  Cardiovascular:     Rate and Rhythm: Normal rate and regular rhythm.     Pulses: Normal pulses.     Heart sounds: No murmur heard. Pulmonary:     Effort: Pulmonary effort is normal. No respiratory distress.     Breath sounds: Normal breath sounds. No stridor. No wheezing or rhonchi.  Abdominal:     General: There is no distension.     Palpations: Abdomen is soft.     Tenderness: There is abdominal tenderness.  Musculoskeletal:     Right lower leg: No edema.     Left lower leg: No edema.  Skin:    General: Skin is warm and dry.  Neurological:     Mental Status: He is alert and oriented to person, place, and time.     Motor: Tremor present.    Laboratory: Most recent CBC Lab Results  Component Value Date   WBC 12.2 (H) 08/18/2024   HGB 11.1 (L) 08/18/2024   HCT 32.2 (L) 08/18/2024   MCV 95.0 08/18/2024   PLT 167 08/18/2024   Most recent  BMP    Latest Ref Rng & Units 08/19/2024    3:43 AM  BMP  Glucose 70 - 99 mg/dL 826   BUN 6 - 20 mg/dL <5   Creatinine 9.38 - 1.24 mg/dL 9.49   Sodium 864 - 854 mmol/L 127   Potassium 3.5 - 5.1 mmol/L 3.3   Chloride 98 - 111 mmol/L 90   CO2 22 - 32 mmol/L 26   Calcium 8.9 - 10.3 mg/dL 7.5     Imaging/Diagnostic Tests: Radiologist Impression: No results found.  My interpretation: n/a Lera Nancyann KATHEE ROSALEA 08/19/2024, 1:37 PM  PGY-1, [facility] FPTS Intern pager:  [phone], text pages welcome Secure chat group [facility] Teaching Service

## 2024-08-19 NOTE — Plan of Care (Signed)
  Problem: Nutritional: Goal: Maintenance of adequate nutrition will improve Outcome: Progressing Goal: Progress toward achieving an optimal weight will improve Outcome: Progressing   Problem: Skin Integrity: Goal: Risk for impaired skin integrity will decrease Outcome: Progressing   Problem: Tissue Perfusion: Goal: Adequacy of tissue perfusion will improve Outcome: Progressing   Problem: Activity: Goal: Risk for activity intolerance will decrease Outcome: Progressing   Problem: Nutrition: Goal: Adequate nutrition will be maintained Outcome: Progressing   Problem: Coping: Goal: Level of anxiety will decrease Outcome: Progressing   Problem: Elimination: Goal: Will not experience complications related to bowel motility Outcome: Progressing Goal: Will not experience complications related to urinary retention Outcome: Progressing   Problem: Pain Managment: Goal: General experience of comfort will improve and/or be controlled Outcome: Progressing   Problem: Safety: Goal: Ability to remain free from injury will improve Outcome: Progressing

## 2024-08-19 NOTE — Assessment & Plan Note (Signed)
  Patient eating food well.  Ongoing abdominal pain.  -zofran  4 mg ODT q8h PRN -Progress to soft diet - Continue scheduled Tylenol  and oxycodone 5 mg, continue as needed 10 mg oxycodone.

## 2024-08-19 NOTE — Plan of Care (Signed)
  Problem: Education: Goal: Ability to describe self-care measures that may prevent or decrease complications (Diabetes Survival Skills Education) will improve Outcome: Progressing Goal: Individualized Educational Video(s) Outcome: Progressing   Problem: Coping: Goal: Ability to adjust to condition or change in health will improve Outcome: Progressing   Problem: Fluid Volume: Goal: Ability to maintain a balanced intake and output will improve Outcome: Progressing   Problem: Health Behavior/Discharge Planning: Goal: Ability to identify and utilize available resources and services will improve Outcome: Progressing Goal: Ability to manage health-related needs will improve Outcome: Progressing   Problem: Metabolic: Goal: Ability to maintain appropriate glucose levels will improve Outcome: Progressing   Problem: Nutritional: Goal: Maintenance of adequate nutrition will improve Outcome: Progressing Goal: Progress toward achieving an optimal weight will improve Outcome: Progressing   Problem: Skin Integrity: Goal: Risk for impaired skin integrity will decrease Outcome: Progressing   Problem: Tissue Perfusion: Goal: Adequacy of tissue perfusion will improve Outcome: Progressing   Problem: Education: Goal: Knowledge of General Education information will improve Description: Including pain rating scale, medication(s)/side effects and non-pharmacologic comfort measures Outcome: Progressing   Problem: Health Behavior/Discharge Planning: Goal: Ability to manage health-related needs will improve Outcome: Progressing   Problem: Clinical Measurements: Goal: Ability to maintain clinical measurements within normal limits will improve Outcome: Progressing Goal: Will remain free from infection Outcome: Progressing Goal: Diagnostic test results will improve Outcome: Progressing Goal: Respiratory complications will improve Outcome: Progressing Goal: Cardiovascular complication will  be avoided Outcome: Progressing   Problem: Activity: Goal: Risk for activity intolerance will decrease Outcome: Progressing   Problem: Nutrition: Goal: Adequate nutrition will be maintained Outcome: Progressing   Problem: Coping: Goal: Level of anxiety will decrease Outcome: Progressing   Problem: Elimination: Goal: Will not experience complications related to bowel motility Outcome: Progressing Goal: Will not experience complications related to urinary retention Outcome: Progressing

## 2024-08-19 NOTE — Assessment & Plan Note (Signed)
 New diagnosis DM this admission.  - Continue moderate SSI ACHS

## 2024-08-19 NOTE — TOC Initial Note (Addendum)
 Transition of Care [facility]) - Initial/Assessment Note    Patient Details  Name: Aaron Burke MRN: [id number] Date of Birth: August 09, 1986  Transition of [facility]) CM/SW Contact:    Roxie KANDICE Stain, RN Phone Number: 08/19/2024, 4:20 PM  Clinical Narrative:                 Spoke to patient regarding transition needs.  Patient is agreeable to [facility] assessing for medicaid. Patient states he isn't sure if [facility], Richerd SAUNDERS, MD will accept him back and is agreeable for NCM to find new PCP. Sent referral to CMA to make PCP apt. Patient declines OP-PT . [facility] ordered walker from [employer]. Patient does have transportation. Patient will need MATCH letter at discharge.   ICM (Inpatient Care Management) will continue to follow for needs.  Expected Discharge Plan: OP Rehab Barriers to Discharge: Continued Medical Work up   Patient Goals and CMS Choice Patient states their goals for this hospitalization and ongoing recovery are:: return home          Expected Discharge Plan and Services   Discharge Planning Services: CM Consult Post Acute Care Choice: Durable Medical Equipment Living arrangements for the past 2 months: Single Family Home                 DME Arranged: Walker rolling DME Agency: [employer] Date DME Agency Contacted: 08/19/24 Time DME Agency Contacted: 1620 Representative spoke with at DME Agency: Zack            Prior Living Arrangements/Services Living arrangements for the past 2 months: Single Family Home Lives with:: Spouse Patient language and need for interpreter reviewed:: Yes Do you feel safe going back to the place where you live?: Yes      Need for Family Participation in Patient Care: Yes (Comment) Care giver support system in place?: Yes (comment)   Criminal Activity/Legal Involvement Pertinent to Current Situation/Hospitalization: No - Comment as needed  Activities of Daily Living   ADL Screening (condition at time of admission) Independently  performs ADLs?: Yes (appropriate for developmental age) Is the patient deaf or have difficulty hearing?: No Does the patient have difficulty seeing, even when wearing glasses/contacts?: No Does the patient have difficulty concentrating, remembering, or making decisions?: No  Permission Sought/Granted                  Emotional Assessment Appearance:: Appears stated age Attitude/Demeanor/Rapport: Engaged Affect (typically observed): Accepting Orientation: : Oriented to Self, Oriented to Place, Oriented to  Time, Oriented to Situation   Psych Involvement: No (comment)  Admission diagnosis:  Acute pancreatitis [K85.90] Pancreatitis [K85.90] Hyperglycemia [R73.9] Other acute pancreatitis with infected necrosis [K85.82] Sepsis, due to unspecified organism, unspecified whether acute organ dysfunction present [facility]) [A41.9] Patient Active Problem List   Diagnosis Date Noted   Refeeding syndrome 08/18/2024   Diabetes mellitus without complication (HCC) 08/18/2024   Acute pancreatitis 08/16/2024   Hyperglycemia 08/16/2024   Dehydration 08/16/2024   Melena 08/16/2024   Pancreatitis 08/15/2024   Abdominal pain 08/15/2024   Chronic health problem 08/15/2024   Low back pain 02/24/2023   MDD (major depressive disorder), recurrent severe, without psychosis (HCC) 12/13/2022   Alcohol use disorder 12/13/2022   GERD (gastroesophageal reflux disease) 12/13/2022   Attention deficit hyperactivity disorder (ADHD) 12/13/2022   GAD (generalized anxiety disorder) 12/13/2022   Suicide attempt (HCC) 12/12/2022   Alcohol intoxication in active alcohol use disorder 12/12/2022   PTSD (post-traumatic stress disorder) 12/12/2022   Hyperlipidemia LDL goal <70  09/29/2020   Pancreatic mass 09/29/2020   Elevated LFTs 09/29/2020   Pre-diabetes 09/29/2020   Chest pain with moderate risk for cardiac etiology 09/27/2020   PCP:  Loretha Richerd SAUNDERS, MD Pharmacy:   [facility] [phone] [facility] - [place], [place] -  [address] [place] [address] Phone: [phone] Fax: [phone]  [id number] - [place], [place] - [address] AT [facility] [address] [facility] [address] [place] [address] Phone: [phone] Fax: [phone]  [facility] [address] [place] [address] Phone: [phone] Fax: [phone]     Social Drivers of Health (SDOH) Social History: SDOH Screenings   Food Insecurity: No Food Insecurity (08/16/2024)  Housing: Low Risk  (08/16/2024)  Transportation Needs: Patient Declined (08/16/2024)  Utilities: Not At Risk (08/16/2024)  Alcohol Screen: Low Risk  (12/13/2022)  Depression (PHQ2-9): Medium Risk (02/22/2023)  Social Connections: Unknown (11/30/2022)   Received from [facility]  Tobacco Use: Medium Risk (08/15/2024)   SDOH Interventions:     Readmission Risk Interventions     No data to display

## 2024-08-19 NOTE — Assessment & Plan Note (Signed)
 Mag and Phos repleted this morning. AST/ALT improved this AM. Hepatitis panel, RPR nonreactive.  - AM CMP, Mg, Phos - continue scheduled phos-NaK supplement - continue other supplements per CIWA protocol

## 2024-08-19 NOTE — Assessment & Plan Note (Signed)
 GERD - Protonix  as above

## 2024-08-19 NOTE — Progress Notes (Signed)
 OT Cancellation Note  Patient Details Name: Aaron Burke MRN: [id number] DOB: Feb 23, 1986   Cancelled Treatment:    Reason Eval/Treat Not Completed: Patient declined, no reason specified (pt declines acute OT evaluation. states he is mobilizing  independently in room, has no concerns regarding ADLs and taking care of self at home. Will s/o. Please reconsult if pt has change in status/further needs.)  Shuan Statzer K, OTD, OTR/L SecureChat Preferred Acute Rehab [phone]   Laneta MARLA Pereyra 08/19/2024, 8:54 AM

## 2024-08-19 NOTE — Assessment & Plan Note (Signed)
 FOBT positive with hgb drop from 16s to 11.1 this AM.  Patient is asymptomatic.  Question if his initial hemoglobin was hemoconcentrated with poor p.o. intake and now diluted, with drops in all cell lines, with fluid resuscitation.  Will continue to monitor - AM CBC -Continue oral Protonix  40mg  twice daily

## 2024-08-19 NOTE — Assessment & Plan Note (Signed)
 Has had diaphoresis, shaking, and anxiety. Is on Ativan  at baseline. Last drink was 10/23 AM. - Continue CIWA's - Increasing Phenobarb taper to 32.4mg  4x daily with 32.4mg  PRN for CIWA >5 - Vitamin supplementation per CIWA protocol - considering naltrexone, plans to detox inpatient

## 2024-08-20 ENCOUNTER — Other Ambulatory Visit (HOSPITAL_COMMUNITY): Payer: Self-pay

## 2024-08-20 LAB — BASIC METABOLIC PANEL WITH GFR
Anion gap: 11 (ref 5–15)
BUN: <5 mg/dL — ABNORMAL LOW (ref 6–20)
CO2: 26 mmol/L (ref 22–32)
Calcium: 7.5 mg/dL — ABNORMAL LOW (ref 8.9–10.3)
Chloride: 92 mmol/L — ABNORMAL LOW (ref 98–111)
Creatinine, Ser: 0.48 mg/dL — ABNORMAL LOW (ref 0.61–1.24)
GFR, Estimated: >60 mL/min (ref >60)
Glucose, Bld: 97 mg/dL (ref 70–99)
Potassium: 3.2 mmol/L — ABNORMAL LOW (ref 3.5–5.1)
Sodium: 129 mmol/L — ABNORMAL LOW (ref 135–145)

## 2024-08-20 LAB — CULTURE, BLOOD (ROUTINE X 2)
Culture: NO GROWTH
Special Requests: ADEQUATE

## 2024-08-20 LAB — CBC
HCT: 32 % — ABNORMAL LOW (ref 39.0–52.0)
Hemoglobin: 11.1 g/dL — ABNORMAL LOW (ref 13.0–17.0)
MCH: 33 pg (ref 26.0–34.0)
MCHC: 34.7 g/dL (ref 30.0–36.0)
MCV: 95.2 fL (ref 80.0–100.0)
Platelets: 183 K/uL (ref 150–400)
RBC: 3.36 MIL/uL — ABNORMAL LOW (ref 4.22–5.81)
RDW: 13.4 % (ref 11.5–15.5)
WBC: 11.4 K/uL — ABNORMAL HIGH (ref 4.0–10.5)
nRBC: 0 % (ref 0.0–0.2)

## 2024-08-20 LAB — MAGNESIUM: Magnesium: 1.8 mg/dL (ref 1.7–2.4)

## 2024-08-20 LAB — GLUCOSE, CAPILLARY
Glucose-Capillary: 161 mg/dL — ABNORMAL HIGH (ref 70–99)
Glucose-Capillary: 140 mg/dL — ABNORMAL HIGH (ref 70–99)

## 2024-08-20 LAB — PHOSPHORUS: Phosphorus: 1.5 mg/dL — ABNORMAL LOW (ref 2.5–4.6)

## 2024-08-20 MED ORDER — PHENOBARBITAL 32.4 MG PO TABS: 32.4000 mg | ORAL_TABLET | Freq: Three times a day (TID) | ORAL | Status: DC

## 2024-08-20 MED ORDER — DULOXETINE HCL 30 MG PO CPEP: 30.0000 mg | ORAL_CAPSULE | Freq: Every day | ORAL | Status: DC

## 2024-08-20 MED ORDER — PANTOPRAZOLE SODIUM 40 MG PO TBEC
40.0000 mg | DELAYED_RELEASE_TABLET | Freq: Every day | ORAL | Status: DC
Start: 2024-08-21 — End: 2024-08-20

## 2024-08-20 MED ORDER — THIAMINE HCL 100 MG PO TABS
100.0000 mg | ORAL_TABLET | Freq: Every day | ORAL | 0 refills | Status: DC
  Filled 2024-08-20: qty 30, 30d supply, fill #0

## 2024-08-20 MED ORDER — OXYCODONE HCL 5 MG PO TABS: 5.0000 mg | ORAL_TABLET | Freq: Three times a day (TID) | ORAL | Status: DC | PRN

## 2024-08-20 MED ORDER — POTASSIUM CHLORIDE 20 MEQ PO PACK: 40.0000 meq | PACK | Freq: Once | ORAL | Status: DC

## 2024-08-20 MED ORDER — POTASSIUM CHLORIDE 20 MEQ PO PACK: 40.0000 meq | PACK | Freq: Three times a day (TID) | ORAL | Status: DC

## 2024-08-20 MED ORDER — DULOXETINE HCL 30 MG PO CPEP
30.0000 mg | ORAL_CAPSULE | Freq: Every day | ORAL | 0 refills | Status: AC
  Filled 2024-08-20: qty 30, 30d supply, fill #0

## 2024-08-20 MED ORDER — OXYCODONE HCL 5 MG PO TABS
5.0000 mg | ORAL_TABLET | Freq: Four times a day (QID) | ORAL | 0 refills | Status: DC | PRN
  Filled 2024-08-20: qty 20, 5d supply, fill #0

## 2024-08-20 MED ORDER — LIDOCAINE 5 % EX PTCH: 1.0000 | MEDICATED_PATCH | CUTANEOUS | Status: DC

## 2024-08-20 MED ORDER — PANTOPRAZOLE SODIUM 40 MG PO TBEC
40.0000 mg | DELAYED_RELEASE_TABLET | Freq: Every day | ORAL | 0 refills | Status: AC
  Filled 2024-08-20: qty 30, 30d supply, fill #0

## 2024-08-20 MED ORDER — ACETAMINOPHEN 500 MG PO TABS: 1000.0000 mg | ORAL_TABLET | Freq: Four times a day (QID) | ORAL | Status: AC | PRN

## 2024-08-20 MED ORDER — POTASSIUM & SODIUM PHOSPHATES 280-160-250 MG PO PACK
1.0000 | PACK | Freq: Three times a day (TID) | ORAL | 0 refills | Status: AC
  Filled 2024-08-20: qty 28, 7d supply, fill #0

## 2024-08-20 MED ORDER — FOLIC ACID 1 MG PO TABS
1.0000 mg | ORAL_TABLET | Freq: Every day | ORAL | 0 refills | Status: AC
Start: 2024-08-21 — End: ?
  Filled 2024-08-20: qty 30, 30d supply, fill #0

## 2024-08-20 MED ORDER — ADULT MULTIVITAMIN W/MINERALS CH
1.0000 | ORAL_TABLET | Freq: Every day | ORAL | 0 refills | Status: AC
  Filled 2024-08-20: qty 30, 30d supply, fill #0

## 2024-08-20 MED ORDER — PHENOBARBITAL 32.4 MG PO TABS
ORAL_TABLET | ORAL | 0 refills | Status: DC
  Filled 2024-08-20: qty 10, 6d supply, fill #0

## 2024-08-20 NOTE — Assessment & Plan Note (Signed)
 Glucose 119-161 with 7 units insulin aspart in the last 24 hours.  - Continue moderate SSI ACHS

## 2024-08-20 NOTE — Assessment & Plan Note (Addendum)
 Continues to have electrolyte depletion despite scheduled supplements as below. - AM BMP, Mg, Phos - supplement 40 meq of K, will schedule for 3 doses today - continue scheduled phos-NaK supplement - continue other supplements per CIWA protocol

## 2024-08-20 NOTE — TOC Transition Note (Signed)
 Transition of Care [facility]) - Discharge Note   Patient Details  Name: Aaron Burke MRN: [id number] Date of Birth: 02/26/1986  Transition of Care [facility]) CM/SW Contact:  Sudie Erminio Deems, RN Phone Number: 08/20/2024, 2:13 PM   Clinical Narrative:  ICM covering this patient today. Patient is without insurance. MATCH completed and submitted to [facility]. No further needs identified.   MATCH MEDICATION ASSISTANCE CARD Pharmacies please call [phone] for claim processing assistance.  Rx BIN: [id number] Rx Group: [id number] Rx PCN: [id number] Relationship Code: 1 Person Code: 01  Patient ID (MRN): [id number]    Patient Name: Aaron Burke   Patient DOB: 04-Mar-1986   Discharge Date: 08/20/24  Expiration Date: 08/28/24 (must be filled within 7 days of discharge)      Final next level of care: Home/Self Care Barriers to Discharge: No Barriers Identified   Patient Goals and CMS Choice Patient states their goals for this hospitalization and ongoing recovery are:: return home  Discharge Plan and Services Additional resources added to the After Visit Summary for     Discharge Planning Services: CM Consult Post Acute Care Choice: Durable Medical Equipment          DME Arranged: Vannie rolling DME Agency: [employer] Date DME Agency Contacted: 08/19/24 Time DME Agency Contacted: 1620 Representative spoke with at DME Agency: Zack   Social Drivers of Health (SDOH) Interventions SDOH Screenings   Food Insecurity: No Food Insecurity (08/16/2024)  Housing: Low Risk  (08/16/2024)  Transportation Needs: Patient Declined (08/16/2024)  Utilities: Not At Risk (08/16/2024)  Alcohol Screen: Low Risk  (12/13/2022)  Depression (PHQ2-9): Medium Risk (02/22/2023)  Social Connections: Unknown (11/30/2022)   Received from [facility]  Tobacco Use: Medium Risk (08/15/2024)   Readmission Risk Interventions     No data to display

## 2024-08-20 NOTE — Progress Notes (Signed)
 Physical Therapy Treatment and d/C Patient Details Name: Aaron Burke MRN: [id number] DOB: 1986/06/17 Today's Date: 08/20/2024   History of Present Illness This is a 38 year old male admitted 10/23 presenting with nausea vomiting and abdominal pain found to be acute pancreatitis.  Given his significant recent alcohol use suspect this is alcoholic pancreatitis in the setting of major depressive episode. PMH including PTSD, MDD, GERD, GAD, HLD    PT Comments  Pt admitted with above diagnosis. Pt is ambulating independently wtihout LOB and with good safety. Pt states he doesn't feel like he needs PT and he gets up when he needs to.  Pt much better than last PT session. Will sign off.    If plan is discharge home, recommend the following: Assistance with cooking/housework   Can travel by private vehicle        Equipment Recommendations  None recommended by PT    Recommendations for Other Services       Precautions / Restrictions Precautions Precautions: Fall Restrictions Weight Bearing Restrictions Per Provider Order: No     Mobility  Bed Mobility Overal bed mobility: Independent             General bed mobility comments: slow moving due to pain but no assist    Transfers Overall transfer level: Needs assistance Equipment used: None Transfers: Sit to/from Stand Sit to Stand: Independent                Ambulation/Gait Ambulation/Gait assistance: Independent Gait Distance (Feet): 80 Feet Assistive device: None Gait Pattern/deviations: Step-through pattern, Decreased stride length, Antalgic   Gait velocity interpretation: 1.31 - 2.62 ft/sec, indicative of limited community ambulator   General Gait Details: Pt still with guarded gait. No LOB with min challenges.  Pt reports less pain and pt not reaching for furniture as he was on evaluation. Pt states he gets around when he wants to and doesnt need therapy.  Declined sitting up in chair.   Stairs Stairs:   (declines practice of steps)           Wheelchair Mobility     Tilt Bed    Modified Rankin (Stroke Patients Only)       Balance Overall balance assessment: Needs assistance Sitting-balance support: No upper extremity supported, Feet supported Sitting balance-Leahy Scale: Good     Standing balance support: No upper extremity supported, During functional activity Standing balance-Leahy Scale: Good                              Communication Communication Communication: No apparent difficulties  Cognition Arousal: Alert Behavior During Therapy: WFL for tasks assessed/performed   PT - Cognitive impairments: No apparent impairments                         Following commands: Intact      Cueing    Exercises      General Comments General comments (skin integrity, edema, etc.): VSS      Pertinent Vitals/Pain Pain Assessment Pain Assessment: Faces Faces Pain Scale: Hurts little more Pain Location: bil Les Pain Descriptors / Indicators: Discomfort, Aching Pain Intervention(s): Limited activity within patient's tolerance, Monitored during session, Repositioned, Premedicated before session    Home Living                          Prior Function  PT Goals (current goals can now be found in the care plan section) Acute Rehab PT Goals Patient Stated Goal: to gohome PT Goal Formulation: All assessment and education complete, DC therapy Progress towards PT goals: Goals met/education completed, patient discharged from PT    Frequency    Min 2X/week      PT Plan      Co-evaluation              AM-PAC PT 6 Clicks Mobility   Outcome Measure  Help needed turning from your back to your side while in a flat bed without using bedrails?: None Help needed moving from lying on your back to sitting on the side of a flat bed without using bedrails?: None Help needed moving to and from a bed to a chair (including a  wheelchair)?: None Help needed standing up from a chair using your arms (e.g., wheelchair or bedside chair)?: None Help needed to walk in hospital room?: None Help needed climbing 3-5 steps with a railing? : A Little 6 Click Score: 23    End of Session   Activity Tolerance: Patient tolerated treatment well Patient left: in bed;with call bell/phone within reach;with bed alarm set Nurse Communication: Mobility status PT Visit Diagnosis: Unsteadiness on feet (R26.81);Muscle weakness (generalized) (M62.81);Pain Pain - Right/Left:  (bil) Pain - part of body: Leg;Knee;Hip;Ankle and joints of foot     Time: 9096-9088 PT Time Calculation (min) (ACUTE ONLY): 8 min  Charges:    $Gait Training: 8-22 mins PT General Charges $$ ACUTE PT VISIT: 1 Visit                     Aamya Orellana M,PT Acute Rehab Services [phone]    [name] 08/20/2024, 10:05 AM

## 2024-08-20 NOTE — Assessment & Plan Note (Addendum)
 CIWA 6 overnight, but now 2.  - continue CIWA - Decrease phenobarb taper to q8h today, but maintain PRN phenobarb for CIWA >5 - Vitamin supplementation per CIWA protocol

## 2024-08-20 NOTE — Progress Notes (Signed)
 Physical Therapy Discharge Patient Details Name: Aaron Burke MRN: [id number] DOB: Mar 10, 1986 Today's Date: 08/20/2024 Time: 9096-9088 PT Time Calculation (min) (ACUTE ONLY): 8 min  Patient discharged from PT services secondary to goals met and no further PT needs identified.  Please see latest therapy progress note for current level of functioning and progress toward goals.    Progress and discharge plan discussed with patient and/or caregiver: Patient/Caregiver agrees with plan  GP     [name] 08/20/2024, 10:06 AM  Zarin Knupp M,PT Acute Rehab Services [phone]

## 2024-08-20 NOTE — Assessment & Plan Note (Signed)
 GERD - Protonix  as above

## 2024-08-20 NOTE — Progress Notes (Signed)
     Daily Progress Note Intern Pager: [phone]  Patient name: Aaron Burke Medical record number: [id number] Date of birth: [birth year] Age: 38 y.o. Gender: male  Primary Care Provider: Loretha Richerd SAUNDERS, MD Consultants: None Code Status: DNR  Pt Overview and Major Events to Date:  10/23-admitted   Assessment and Plan:  This is a 38 year old male with a PMH of PTSD, MDD, GERD, GAD, HLD who was admitted for acute pancreatitis and alcoholic/starvation ketoacidosis, now with refeeding syndrome and going through withdrawal. Assessment & Plan Acute pancreatitis (Resolved: 08/20/2024) Low back pain   He does have lower abdominal pain today and persistent low back pain.  He has been eating well.  He got as needed Zofran  1 time this morning for some nausea, but has not vomited.  -Can continue Zofran   - will reduce PRN oxy from 10 to 5 mg - will add Lidocaine  patch, heating pad for LBP Refeeding syndrome Continues to have electrolyte depletion despite scheduled supplements as below. - AM BMP, Mg, Phos - supplement 40 meq of K, will schedule for 3 doses today - continue scheduled phos-NaK supplement - continue other supplements per CIWA protocol Diabetes mellitus without complication (HCC) Glucose 119-161 with 7 units insulin aspart in the last 24 hours.  - Continue moderate SSI ACHS Alcohol use disorder CIWA 6 overnight, but now 2.  - continue CIWA - Decrease phenobarb taper to q8h today, but maintain PRN phenobarb for CIWA >5 - Vitamin supplementation per CIWA protocol Melena CBC this morning with stable hemoglobin.  Suspect hemoconcentrated on initial CBC with downtrending of all cell lines subsequently. - Reduce Protonix  dosing to oral once daily, given low likelihood of upper GI bleed Chronic health problem GERD - Protonix  as above  FEN/GI: Soft PPx: Lovenox  Dispo:Home pending clinical improvement .   Subjective:  He reports he has low abdominal pain and low back  pain. 3 full, loose stools in the last 24 hours. He says he is irritated that he has more people asking questions. Mild nausea, no vomiting. Eating well.  Objective: Temp:  [98.3 F (36.8 C)-99.1 F (37.3 C)] 98.5 F (36.9 C) (10/28 0727) Pulse Rate:  [85-99] 97 (10/28 0727) Resp:  [15-20] 20 (10/28 0727) BP: (101-135)/(66-96) 101/74 (10/28 0727) SpO2:  [94 %-97 %] 94 % (10/28 0727) Physical Exam: General: well-appearing male lying in bed Cardiovascular: RRR Respiratory: breathing comfortably on room air Abdomen: soft, nontender, mild distension  Laboratory: Most recent CBC Lab Results  Component Value Date   WBC 12.2 (H) 08/18/2024   HGB 11.1 (L) 08/18/2024   HCT 32.2 (L) 08/18/2024   MCV 95.0 08/18/2024   PLT 167 08/18/2024   Most recent BMP    Latest Ref Rng & Units 08/19/2024    3:43 AM  BMP  Glucose 70 - 99 mg/dL 826   BUN 6 - 20 mg/dL <5   Creatinine 9.38 - 1.24 mg/dL 9.49   Sodium 864 - 854 mmol/L 127   Potassium 3.5 - 5.1 mmol/L 3.3   Chloride 98 - 111 mmol/L 90   CO2 22 - 32 mmol/L 26   Calcium 8.9 - 10.3 mg/dL 7.5    Phos 1.5 Mg 1.8  Imaging/Diagnostic Tests: None  Alena Morrison, Elio, MD 08/20/2024, 7:45 AM  PGY-1, [facility] FPTS Intern pager: [phone], text pages welcome Secure chat group [facility] Teaching Service

## 2024-08-20 NOTE — Assessment & Plan Note (Addendum)
  He does have lower abdominal pain today and persistent low back pain.  He has been eating well.  He got as needed Zofran  1 time this morning for some nausea, but has not vomited.  -Can continue Zofran   - will reduce PRN oxy from 10 to 5 mg - will add Lidocaine  patch, heating pad for LBP

## 2024-08-20 NOTE — Discharge Summary (Signed)
 Family Medicine Teaching [facility] Discharge Summary  Patient name: Aaron Burke Medical record number: [id number] Date of birth: December 26, 1985 Age: 38 y.o. Gender: male Date of Admission: 08/15/2024  Date of Discharge: 08/20/2024 Admitting Physician: Twyla Nearing, MD  Primary Care Provider: Loretha Richerd SAUNDERS, MD Consultants: none  Indication for Hospitalization: acute pancreatitis  Discharge Diagnoses/Problem List:  Principal Problem for Admission: Acute pancreatitis Other Problems addressed during stay:    Hyperglycemia   Alcohol use disorder   Pancreatic mass   Dehydration   Low back pain   Melena   Refeeding syndrome   Diabetes mellitus without complication [facility])   Brief Hospital Course:  This is a 38 year old male with PMH alcohol use disorder, PTSD, MDD, GERD, GAD, HLD who was admitted to the [facility] family medicine teaching service for acute pancreatitis and starvation/alcohol ketoacidosis.  His hospital course is detailed below:  Acute pancreatitis Patient admitted with several days of poor p.o. intake, abdominal pain, nausea vomiting with lipase greater than 3 times normal limits and CT findings with pancreatic inflammation and likely developing pseudocyst, in the setting of elevated ethanol and history of significant alcohol use.  Patient started on 1.5 maintenance and scheduled pain control regimen.  Patient's pain and nausea improved during admission, and diet was advance as tolerated.  Patient tolerated soft diet at time of discharge.  Starvation/alcohol ketoacidosis AUD Transaminitis  Refeeding Syndrome Urinary ketones, hypokalemia, hyperglycemia, and anion gap on admission.  With patient reporting poor p.o. take and four drinks daily. patient placed on insulin drip with IVF. Insulin drip discontinued when patient's glucose returned to normal and anion gap improved, notably A1c 8.0 less likely to be diabetic ketoacidosis. IV thiamine  administered. AST:  ALT ratio greater than 2.  Right upper quadrant ultrasound hepatic steatosis, lipid and hepatitis panel within normal limits.  Patient interested in alcohol cessation, and detox done with phenobarbital taper inpatient and continued on discharge, to end on 11/2. Potassium and phosphorus recurrently depleted and supplementation scheduled.     Lumbar pain, LE weakness Chronic lumbar pain without neuropathic pain symptoms; however, acute onset of lower extremity weakness and new report of urinary hesitancy.  Lumbar MRI did not reveal any stenosis. Patient started on Cymbalta , which he had taken previously.  Melena Patient reported 3-week history of melanous stools.  No hematochezia.  Hemoglobin within normal limits.  PPI twice daily started for possible upper GI bleed. FOBT positive but hemoglobin stable. Protonix  reduced to once daily dosing for GERD.   PCP recommendations: Consider referral for colonoscopy or endoscopy  Discuss initiation of naltrexone for AUD after opioid cessation Recheck BMP for electrolytes given refeeding syndrome inpatient Evaluate efficacy of Cymbalta , and consider other medication for depression  Discuss seeing psychiatry for further treatment of depression and AUD Initiate treatment for new DM diagnosis, A1C 8.0    Results/Tests Pending at Time of Discharge:  None  Disposition: home  Discharge Condition: stable  Discharge Exam:  Vitals:   08/20/24 0727 08/20/24 1116  BP: 101/74 103/71  Pulse: 97 92  Resp: 20 14  Temp: 98.5 F (36.9 C) 99.1 F (37.3 C)  SpO2: 94% 96%   General: well-appearing male lying in bed Cardiovascular: RRR Respiratory: breathing comfortably on room air Abdomen: soft, nontender, mild distension  Significant Procedures: None  Significant Labs and Imaging:  Recent Labs  Lab 08/20/24 0827  WBC 11.4*  HGB 11.1*  HCT 32.0*  PLT 183   Recent Labs  Lab 08/19/24 0343 08/20/24 0827  NA  127* 129*  K 3.3* 3.2*  CL 90* 92*   CO2 26 26  GLUCOSE 173* 97  BUN <5* <5*  CREATININE 0.50* 0.48*  CALCIUM 7.5* 7.5*  MG 1.6* 1.8  PHOS 1.3* 1.5*  ALKPHOS 298*  --   AST 182*  --   ALT 109*  --   ALBUMIN 1.8*  --     MR Lumbar Spine W Wo Contrast Result Date: 08/16/2024 EXAM: MR Lumbar Spine with and without intravenous contrast. 08/16/2024 01:19:00 PM TECHNIQUE: Multiplanar multisequence MRI of the lumbar spine was performed with and without the administration of intravenous contrast. COMPARISON: CT abdomen/pelvis 08/15/2024. CLINICAL HISTORY: Low back pain, cauda equina syndrome suspected. FINDINGS: BONES AND ALIGNMENT: Conventional lumbosacral anatomy is assumed with 5 non-rib-bearing, lumbar-type vertebral bodies. Normal vertebral body heights. Normal bone marrow signal. No abnormal enhancement. Normal alignment. Modic type 1 degenerative endplate marrow signal changes at L4-L5. SPINAL CORD: The conus terminates at T12-L1. SOFT TISSUES: Extensive inflammatory changes in the retroperitoneum, compatible with acute pancreatitis seen on recent abdominal CT. Mild fatty atrophy of the paraspinal muscles. L1-L2: No disc herniation. No spinal canal stenosis or neural foraminal narrowing. L2-L3: No disc herniation. No spinal canal stenosis or neural foraminal narrowing. L3-L4: No disc herniation. No spinal canal stenosis or neural foraminal narrowing. L4-L5: Small central disc extrusion without significant mass effect. No spinal canal stenosis or neural foraminal narrowing. L5-S1: Small left central disc protrusion without significant mass effect. No spinal canal stenosis or neural foraminal narrowing. Mild bilateral facet arthropathy. IMPRESSION: 1. No spinal canal or neural foraminal stenosis in the lumbar spine. 2. Extensive retroperitoneal inflammatory changes compatible with acute pancreatitis, as seen on recent abdominal CT. Electronically signed by: Ryan Chess MD 08/16/2024 01:45 PM EDT RP Workstation: [id number]   US   Abdomen Limited RUQ (LIVER/GB) Result Date: 08/15/2024 CLINICAL DATA:  Pancreatitis EXAM: ULTRASOUND ABDOMEN LIMITED RIGHT UPPER QUADRANT COMPARISON:  CT 08/15/2024 FINDINGS: Gallbladder: Surgically absent Common bile duct: Diameter: 2.4 mm Liver: Diffusely echogenic. No focal hepatic abnormality. Portal vein is patent on color Doppler imaging with normal direction of blood flow towards the liver. Other: Trace free fluid. IMPRESSION: 1. Status post cholecystectomy. No biliary dilatation. 2. Diffusely echogenic liver consistent with hepatic steatosis. Trace free fluid. Electronically Signed   By: Luke Bun M.D.   On: 08/15/2024 22:36   CT ABDOMEN PELVIS W CONTRAST Result Date: 08/15/2024 CLINICAL DATA:  Acute abdominal pain EXAM: CT ABDOMEN AND PELVIS WITH CONTRAST TECHNIQUE: Multidetector CT imaging of the abdomen and pelvis was performed using the standard protocol following bolus administration of intravenous contrast. RADIATION DOSE REDUCTION: This exam was performed according to the departmental dose-optimization program which includes automated exposure control, adjustment of the mA and/or kV according to patient size and/or use of iterative reconstruction technique. CONTRAST:  75mL OMNIPAQUE  IOHEXOL  350 MG/ML SOLN COMPARISON:  09/27/2020 FINDINGS: Lower chest: Lung bases are free of acute infiltrate or sizable effusion. Hepatobiliary: Liver is diffusely decreased in attenuation consistent with fatty infiltration. The gallbladder has been surgically removed. No biliary ductal dilatation is seen. Pancreas: Inflammatory changes noted surrounding the midportion of the pancreas consistent with acute pancreatitis. This inflammatory change envelopment the second and third portions of the duodenum and extends along the anterior aspect of Gerota's fascia with more discrete collections identified. The largest of these measures approximately 4.0 x 1.7 cm likely related to a developing pseudocyst. This is best  seen on image number 51 of series 4. Spleen: Normal in size without focal abnormality.  Adrenals/Urinary Tract: Adrenal glands are within normal limits. Kidneys show normal enhancement pattern. No renal calculi or obstructive changes are seen. Delayed imaging shows normal excretion. The bladder is well distended. Stomach/Bowel: No obstructive changes of the colon are seen. Some inflammatory changes noted surrounding the ascending colon as well as a portion of the sigmoid colon related to the pancreatic changes. The appendix is within normal limits. Small bowel and stomach are otherwise within normal limits aside from the inflammatory change surrounding the duodenum. Vascular/Lymphatic: No significant vascular findings are present. No enlarged abdominal or pelvic lymph nodes. Reproductive: Prostate is unremarkable. Other: Minimal free pelvic fluid is noted likely related to the inflammatory changes. Musculoskeletal: No acute or significant osseous findings. IMPRESSION: Changes consistent with acute pancreatitis with diffuse phlegmon as described. Some developing fluid collections are noted along the anterior aspect of Gerota's fascia suspicious for developing pseudocysts. Follow-up examination is recommended. Fatty liver. No other focal abnormality is noted. Electronically Signed   By: Oneil Devonshire M.D.   On: 08/15/2024 20:16   CT Head Wo Contrast Result Date: 08/15/2024 CLINICAL DATA:  Altered mental status EXAM: CT HEAD WITHOUT CONTRAST TECHNIQUE: Contiguous axial images were obtained from the base of the skull through the vertex without intravenous contrast. RADIATION DOSE REDUCTION: This exam was performed according to the departmental dose-optimization program which includes automated exposure control, adjustment of the mA and/or kV according to patient size and/or use of iterative reconstruction technique. COMPARISON:  None Available. FINDINGS: Brain: No evidence of acute infarction, hemorrhage,  hydrocephalus, extra-axial collection or mass lesion/mass effect. Mild atrophic changes and chronic white matter ischemic changes are seen. Vascular: No hyperdense vessel or unexpected calcification. Skull: Normal. Negative for fracture or focal lesion. Sinuses/Orbits: No acute finding. Other: None. IMPRESSION: Chronic atrophic and ischemic changes without acute abnormality. Electronically Signed   By: Oneil Devonshire M.D.   On: 08/15/2024 20:06   DG Chest Port 1 View Result Date: 08/15/2024 EXAM: 1 VIEW(S) XRAY OF THE CHEST 08/15/2024 07:12:00 PM COMPARISON: Comparison with 09/27/2020. CLINICAL HISTORY: Questionable sepsis - evaluate for abnormality. Patient in ED with complaints of not eating and drinking for the past 10 days. He has only had 2 Gatorades and had alcohol 4 times in the past 10 days. He reports that his abdomen is in severe pain. Reports he drinks 4 mixed drinks on a daily basis. Patient reports confusion and having a hard time seeing. FINDINGS: LUNGS AND PLEURA: The right hemidiaphragm is elevated. No focal pulmonary opacity. No pulmonary edema. No pleural effusion. No pneumothorax. HEART AND MEDIASTINUM: No acute abnormality of the cardiac and mediastinal silhouettes. BONES AND SOFT TISSUES: No acute osseous abnormality. IMPRESSION: 1. No acute cardiopulmonary findings. Electronically signed by: Norman Gatlin MD 08/15/2024 07:17 PM EDT RP Workstation: [id number]      Discharge Medications:  Allergies as of 08/20/2024       Reactions   Percocet [oxycodone-acetaminophen ] Other (See Comments)   makes me feel uncomfortable        Medication List     TAKE these medications    acetaminophen  500 MG tablet Commonly known as: TYLENOL  Take 2 tablets (1,000 mg total) by mouth every 6 (six) hours as needed (pain).   DULoxetine  30 MG capsule Commonly known as: CYMBALTA  Take 1 capsule (30 mg total) by mouth daily.   folic acid 1 MG tablet Commonly known as: FOLVITE Take 1 tablet  (1 mg total) by mouth daily. Start taking on: August 21, 2024   multivitamin with minerals  Tabs tablet Take 1 tablet by mouth daily. Start taking on: August 21, 2024   oxyCODONE 5 MG immediate release tablet Commonly known as: Oxy IR/ROXICODONE Take 1 tablet (5 mg total) by mouth every 6 (six) hours as needed for severe pain (pain score 7-10).   pantoprazole  40 MG tablet Commonly known as: PROTONIX  Take 1 tablet (40 mg total) by mouth daily. Start taking on: August 21, 2024   PHENobarbital 32.4 MG tablet Commonly known as: LUMINAL On 10/28, take one tab at 10 pm On 10/29, take one tab every 8 hours (3 times in the day) On 10/30 and 10/31, take one tab every 12 hours (2 times in the day) On 11/1 and 11/2, take one tab only (once in the day)   potassium & sodium phosphates 280-160-250 MG Pack Commonly known as: PHOS-NAK Take 1 packet by mouth 4 (four) times daily -  with meals and at bedtime for 7 days.   thiamine  100 MG tablet Commonly known as: VITAMIN B1 Take 1 tablet (100 mg total) by mouth daily. Start taking on: August 21, 2024               Durable Medical Equipment  (From admission, onward)           Start     Ordered   08/19/24 1616  For home use only DME Walker rolling  Once       Question Answer Comment  Walker: With 5 Inch Wheels   Patient needs a walker to treat with the following condition Physical deconditioning      08/19/24 1616            Discharge Instructions: Please refer to Patient Instructions section of EMR for full details.  Patient was counseled important signs and symptoms that should prompt return to medical care, changes in medications, dietary instructions, activity restrictions, and follow up appointments.   Follow-Up Appointments:  Follow-up Information     Delayne Artist PARAS, MD Follow up.   Specialty: Family Medicine Why: TIME : 11:30 AM   PLEASE ARRIVE AT  11:00 AM DATE : MOVEMBER 12 , 2025 WEDNESDAY   PLEASE  BRING ALL CURRENT MEDICATION, ID and INS CARD Contact information: [address] [place] [address] [phone]                 Alena Morrison, Elio, MD 08/20/2024, 1:39 PM PGY-1, [facility] Family Medicine

## 2024-08-20 NOTE — TOC Progression Note (Signed)
 Transition of Care [facility]) - Progression Note    Patient Details  Name: Aaron Burke MRN: [id number] Date of Birth: 1986-05-28  Transition of Care [facility]) CM/SW Contact  Lauraine FORBES Saa, LCSWA Phone Number: 08/20/2024, 10:58 AM  Clinical Narrative:     10:58 AM CSW introduced self and role to patient. Patient accepted CSW offer of substance use resources. CSW provided resources. TOC will continue to follow.  Expected Discharge Plan: OP Rehab Barriers to Discharge: Continued Medical Work up               Expected Discharge Plan and Services   Discharge Planning Services: CM Consult Post Acute Care Choice: Durable Medical Equipment Living arrangements for the past 2 months: Single Family Home                 DME Arranged: Walker rolling DME Agency: [employer] Date DME Agency Contacted: 08/19/24 Time DME Agency Contacted: 1620 Representative spoke with at DME Agency: Zack             Social Drivers of Health (SDOH) Interventions SDOH Screenings   Food Insecurity: No Food Insecurity (08/16/2024)  Housing: Low Risk  (08/16/2024)  Transportation Needs: Patient Declined (08/16/2024)  Utilities: Not At Risk (08/16/2024)  Alcohol Screen: Low Risk  (12/13/2022)  Depression (PHQ2-9): Medium Risk (02/22/2023)  Social Connections: Unknown (11/30/2022)   Received from [facility]  Tobacco Use: Medium Risk (08/15/2024)    Readmission Risk Interventions     No data to display

## 2024-08-20 NOTE — Progress Notes (Signed)
 Reviewed AVS, patient expressed understanding of medications, MD follow up reviewed.   Removed IV, Site clean, dry and intact.  See LDA for information on wounds at discharge. Patient states all belongings brought to the hospital at time of admission are accounted for and packed to take home.  Picked up medications from [facility] pharmacy. [facility] contacted to transport patient to Discharge lounge to wait for transportation home.

## 2024-08-20 NOTE — Assessment & Plan Note (Addendum)
 CBC this morning with stable hemoglobin.  Suspect hemoconcentrated on initial CBC with downtrending of all cell lines subsequently. - Reduce Protonix  dosing to oral once daily, given low likelihood of upper GI bleed

## 2024-08-22 LAB — ZINC: Zinc: 30 ug/dL — ABNORMAL LOW (ref 44–115)

## 2024-08-24 ENCOUNTER — Other Ambulatory Visit: Payer: Self-pay

## 2024-08-24 ENCOUNTER — Encounter (HOSPITAL_COMMUNITY): Payer: Self-pay

## 2024-08-24 ENCOUNTER — Emergency Department (HOSPITAL_COMMUNITY)
Admission: EM | Admit: 2024-08-24 | Discharge: 2024-08-25 | Disposition: A | Discharge status: Home / Self Care | Payer: MEDICAID | Attending: Emergency Medicine | Admitting: Emergency Medicine

## 2024-08-24 ENCOUNTER — Emergency Department (HOSPITAL_COMMUNITY): Payer: MEDICAID

## 2024-08-24 DIAGNOSIS — Z794 Long term (current) use of insulin: Secondary | ICD-10-CM | POA: Insufficient documentation

## 2024-08-24 DIAGNOSIS — R748 Abnormal levels of other serum enzymes: Secondary | ICD-10-CM | POA: Insufficient documentation

## 2024-08-24 DIAGNOSIS — R000 Tachycardia, unspecified: Secondary | ICD-10-CM | POA: Insufficient documentation

## 2024-08-24 DIAGNOSIS — D72829 Elevated white blood cell count, unspecified: Secondary | ICD-10-CM | POA: Insufficient documentation

## 2024-08-24 DIAGNOSIS — K861 Other chronic pancreatitis: Secondary | ICD-10-CM | POA: Diagnosis present

## 2024-08-24 DIAGNOSIS — R1084 Generalized abdominal pain: Principal | ICD-10-CM | POA: Insufficient documentation

## 2024-08-24 DIAGNOSIS — E1165 Type 2 diabetes mellitus with hyperglycemia: Secondary | ICD-10-CM | POA: Insufficient documentation

## 2024-08-24 LAB — I-STAT VENOUS BLOOD GAS, ED
Acid-base deficit: 3 mmol/L — ABNORMAL HIGH (ref 0.0–2.0)
Bicarbonate: 21.2 mmol/L (ref 20.0–28.0)
Calcium, Ion: 1.06 mmol/L — ABNORMAL LOW (ref 1.15–1.40)
HCT: 30 % — ABNORMAL LOW (ref 39.0–52.0)
Hemoglobin: 10.2 g/dL — ABNORMAL LOW (ref 13.0–17.0)
O2 Saturation: 45 %
Potassium: 3.1 mmol/L — ABNORMAL LOW (ref 3.5–5.1)
Sodium: 132 mmol/L — ABNORMAL LOW (ref 135–145)
TCO2: 22 mmol/L (ref 22–32)
pCO2, Ven: 34.2 mmHg — ABNORMAL LOW (ref 44–60)
pH, Ven: 7.401 (ref 7.25–7.43)
pO2, Ven: 25 mmHg — CL (ref 32–45)

## 2024-08-24 LAB — I-STAT CHEM 8, ED
BUN: <3 mg/dL — ABNORMAL LOW (ref 6–20)
Calcium, Ion: 1.08 mmol/L — ABNORMAL LOW (ref 1.15–1.40)
Chloride: 95 mmol/L — ABNORMAL LOW (ref 98–111)
Creatinine, Ser: 0.5 mg/dL — ABNORMAL LOW (ref 0.61–1.24)
Glucose, Bld: 184 mg/dL — ABNORMAL HIGH (ref 70–99)
HCT: 30 % — ABNORMAL LOW (ref 39.0–52.0)
Hemoglobin: 10.2 g/dL — ABNORMAL LOW (ref 13.0–17.0)
Potassium: 2.9 mmol/L — ABNORMAL LOW (ref 3.5–5.1)
Sodium: 132 mmol/L — ABNORMAL LOW (ref 135–145)
TCO2: 20 mmol/L — ABNORMAL LOW (ref 22–32)

## 2024-08-24 LAB — COMPREHENSIVE METABOLIC PANEL WITH GFR
ALT: 67 U/L — ABNORMAL HIGH (ref 0–44)
AST: 137 U/L — ABNORMAL HIGH (ref 15–41)
Albumin: 2.1 g/dL — ABNORMAL LOW (ref 3.5–5.0)
Alkaline Phosphatase: 464 U/L — ABNORMAL HIGH (ref 38–126)
Anion gap: 14 (ref 5–15)
BUN: <5 mg/dL — ABNORMAL LOW (ref 6–20)
CO2: 20 mmol/L — ABNORMAL LOW (ref 22–32)
Calcium: 7.9 mg/dL — ABNORMAL LOW (ref 8.9–10.3)
Chloride: 96 mmol/L — ABNORMAL LOW (ref 98–111)
Creatinine, Ser: 0.53 mg/dL — ABNORMAL LOW (ref 0.61–1.24)
GFR, Estimated: >60 mL/min (ref >60)
Glucose, Bld: 193 mg/dL — ABNORMAL HIGH (ref 70–99)
Potassium: 3.1 mmol/L — ABNORMAL LOW (ref 3.5–5.1)
Sodium: 130 mmol/L — ABNORMAL LOW (ref 135–145)
Total Bilirubin: 1.7 mg/dL — ABNORMAL HIGH (ref 0.0–1.2)
Total Protein: 5.9 g/dL — ABNORMAL LOW (ref 6.5–8.1)

## 2024-08-24 LAB — CBC
HCT: 32.7 % — ABNORMAL LOW (ref 39.0–52.0)
Hemoglobin: 11 g/dL — ABNORMAL LOW (ref 13.0–17.0)
MCH: 33.3 pg (ref 26.0–34.0)
MCHC: 33.6 g/dL (ref 30.0–36.0)
MCV: 99.1 fL (ref 80.0–100.0)
Platelets: 796 K/uL — ABNORMAL HIGH (ref 150–400)
RBC: 3.3 MIL/uL — ABNORMAL LOW (ref 4.22–5.81)
RDW: 14 % (ref 11.5–15.5)
WBC: 15.9 K/uL — ABNORMAL HIGH (ref 4.0–10.5)
nRBC: 0 % (ref 0.0–0.2)

## 2024-08-24 LAB — CK: Total CK: 23 U/L — ABNORMAL LOW (ref 49–397)

## 2024-08-24 LAB — ETHANOL: Alcohol, Ethyl (B): <15 mg/dL (ref <15)

## 2024-08-24 LAB — CBG MONITORING, ED
Glucose-Capillary: 200 mg/dL — ABNORMAL HIGH (ref 70–99)
Glucose-Capillary: 185 mg/dL — ABNORMAL HIGH (ref 70–99)

## 2024-08-24 LAB — LIPASE, BLOOD: Lipase: 29 U/L (ref 11–51)

## 2024-08-24 MED ORDER — SODIUM CHLORIDE 0.9 % IV BOLUS
1000.0000 mL | Freq: Once | INTRAVENOUS | Status: AC
  Administered 2024-08-24: 1000 mL via INTRAVENOUS

## 2024-08-24 MED ORDER — IOHEXOL 350 MG/ML SOLN
75.0000 mL | Freq: Once | INTRAVENOUS | Status: AC | PRN
  Administered 2024-08-24: 75 mL via INTRAVENOUS

## 2024-08-24 MED ORDER — ONDANSETRON HCL 4 MG/2ML IJ SOLN
4.0000 mg | Freq: Once | INTRAMUSCULAR | Status: AC
  Administered 2024-08-24: 4 mg via INTRAVENOUS
  Filled 2024-08-24: qty 2

## 2024-08-24 MED ORDER — POTASSIUM CHLORIDE CRYS ER 20 MEQ PO TBCR
40.0000 meq | EXTENDED_RELEASE_TABLET | Freq: Once | ORAL | Status: AC
  Administered 2024-08-24: 40 meq via ORAL
  Filled 2024-08-24: qty 2

## 2024-08-24 MED ORDER — HYDROMORPHONE HCL 1 MG/ML IJ SOLN
1.0000 mg | Freq: Once | INTRAMUSCULAR | Status: AC
  Administered 2024-08-24: 1 mg via INTRAVENOUS
  Filled 2024-08-24: qty 1

## 2024-08-24 NOTE — ED Provider Notes (Incomplete)
 [place] [facility] Provider Note   CSN: [id number] Arrival date & time: 08/24/24  1816     Patient presents with: Hyperglycemia, Diarrhea, Abdominal Pain, Back Pain, and Nausea   Aaron Burke is a 38 y.o. male.  {Add pertinent medical, surgical, social history, OB history to HPI:5934} 38 year old male presents with concern for hyperglycemia and abdominal/back pain. Recently admitted to the hospital for pancreatitis, diagnosed with diabetes. Patient was dc without a meter or insulin/meds. He obtained a meter and has been checking his glucose at home, ranges form 180s-280s. Patient is not scheduled to be seen in clinic for a few more weeks, he called to ask for rx for insulin and was advised to go to the ER. 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 Unsure if he has had fevers, states he did feel warm one day but did not have a thermometer. Reports vomiting, diarrhea. No blood in emesis or stools. Notes decreased urine output. He has been trying to improve his diet due to new diagnosis diabetes. He has eating raw mushrooms and lactose free milk today. Has also had spinach and humus, grilled chicken.        Prior to Admission medications   Medication Sig Start Date End Date Taking? Authorizing Provider  acetaminophen  (TYLENOL ) 500 MG tablet Take 2 tablets (1,000 mg total) by mouth every 6 (six) hours as needed (pain). 08/20/24   Alena Morrison, Reagan, MD  DULoxetine  (CYMBALTA ) 30 MG capsule Take 1 capsule (30 mg total) by mouth daily. 08/20/24   Alena Morrison, Reagan, MD  folic acid (FOLVITE) 1 MG tablet Take 1 tablet (1 mg total) by mouth daily. 08/21/24   Alena Morrison, Reagan, MD  Multiple Vitamin (MULTIVITAMIN WITH MINERALS) TABS tablet Take 1 tablet by mouth daily. 08/21/24   Alena Morrison, Reagan, MD  oxyCODONE (OXY IR/ROXICODONE) 5  MG immediate release tablet Take 1 tablet (5 mg total) by mouth every 6 (six) hours as needed for severe pain (pain score 7-10). 08/20/24   Alena Morrison, Reagan, MD  pantoprazole  (PROTONIX ) 40 MG tablet Take 1 tablet (40 mg total) by mouth daily. 08/21/24   Alena Morrison, Reagan, MD  PHENobarbital (LUMINAL) 32.4 MG tablet On 10/28, take one tab at 10 pm On 10/29, take one tab every 8 hours (3 times in the day) On 10/30 and 10/31, take one tab every 12 hours (2 times in the day) On 11/1 and 11/2, take one tab only (once in the day) 08/20/24   Alena Morrison, Reagan, MD  potassium & sodium phosphates (PHOS-NAK) 280-160-250 MG PACK Take 1 packet by mouth 4 (four) times daily -  with meals and at bedtime for 7 days. 08/20/24 08/27/24  Alena Morrison, Reagan, MD  thiamine  (VITAMIN B1) 100 MG tablet Take 1 tablet (100 mg total) by mouth daily. 08/21/24   Alena Morrison, Reagan, MD    Allergies: Percocet [oxycodone-acetaminophen ]    Review of Systems Negative except as per HPI Updated Vital Signs BP 115/86 (BP Location: Left Arm)   Pulse (!) 120   Temp 98.6 F (37 C)   Resp (!) 22   Ht 5' 9 (1.753 m)   Wt 68 kg   SpO2 99%   BMI 22.15 kg/m   Physical Exam Vitals and nursing note reviewed.  Constitutional:      General: He is not in acute distress.  Appearance: He is well-developed. He is not diaphoretic.  HENT:     Head: Normocephalic and atraumatic.  Cardiovascular:     Rate and Rhythm: Regular rhythm. Tachycardia present.     Heart sounds: Normal heart sounds.  Pulmonary:     Effort: Pulmonary effort is normal.     Breath sounds: Normal breath sounds.  Abdominal:     Palpations: Abdomen is soft.     Tenderness: There is abdominal tenderness. There is guarding.  Skin:    General: Skin is warm and dry.     Findings: No erythema or rash.  Neurological:     Mental Status: He is alert and oriented to person, place, and time.  Psychiatric:        Behavior:  Behavior normal.     (all labs ordered are listed, but only abnormal results are displayed) Labs Reviewed  COMPREHENSIVE METABOLIC PANEL WITH GFR - Abnormal; Notable for the following components:      Result Value   Sodium 130 (*)    Potassium 3.1 (*)    Chloride 96 (*)    CO2 20 (*)    Glucose, Bld 193 (*)    BUN <5 (*)    Creatinine, Ser 0.53 (*)    Calcium 7.9 (*)    Total Protein 5.9 (*)    Albumin 2.1 (*)    AST 137 (*)    ALT 67 (*)    Alkaline Phosphatase 464 (*)    Total Bilirubin 1.7 (*)    All other components within normal limits  CBC - Abnormal; Notable for the following components:   WBC 15.9 (*)    RBC 3.30 (*)    Hemoglobin 11.0 (*)    HCT 32.7 (*)    Platelets 796 (*)    All other components within normal limits  CBG MONITORING, ED - Abnormal; Notable for the following components:   Glucose-Capillary 200 (*)    All other components within normal limits  CBG MONITORING, ED - Abnormal; Notable for the following components:   Glucose-Capillary 185 (*)    All other components within normal limits  I-STAT CHEM 8, ED - Abnormal; Notable for the following components:   Sodium 132 (*)    Potassium 2.9 (*)    Chloride 95 (*)    BUN <3 (*)    Creatinine, Ser 0.50 (*)    Glucose, Bld 184 (*)    Calcium, Ion 1.08 (*)    TCO2 20 (*)    Hemoglobin 10.2 (*)    HCT 30.0 (*)    All other components within normal limits  I-STAT VENOUS BLOOD GAS, ED - Abnormal; Notable for the following components:   pCO2, Ven 34.2 (*)    pO2, Ven 25 (*)    Acid-base deficit 3.0 (*)    Sodium 132 (*)    Potassium 3.1 (*)    Calcium, Ion 1.06 (*)    HCT 30.0 (*)    Hemoglobin 10.2 (*)    All other components within normal limits  LIPASE, BLOOD  URINALYSIS, ROUTINE W REFLEX MICROSCOPIC  ETHANOL  CK  CBG MONITORING, ED    EKG: None  Radiology: No results found.  {Document cardiac monitor, telemetry assessment procedure when appropriate:32947} Procedures    Medications Ordered in the ED  sodium chloride  0.9 % bolus 1,000 mL (has no administration in time range)  ondansetron  (ZOFRAN ) injection 4 mg (has no administration in time range)  HYDROmorphone  (DILAUDID ) injection 1 mg (has no administration  in time range)  iohexol  (OMNIPAQUE ) 350 MG/ML injection 75 mL (75 mLs Intravenous Contrast Given 08/24/24 2302)      {Click here for ABCD2, HEART and other calculators REFRESH Note before signing:1}                              Medical Decision Making Amount and/or Complexity of Data Reviewed Labs: ordered. Radiology: ordered.  Risk Prescription drug management.   This patient presents to the ED for concern of ***, this involves an extensive number of treatment options, and is a complaint that carries with it a high risk of complications and morbidity.  The differential diagnosis includes ***   Co morbidities / Chronic conditions that complicate the patient evaluation  ***   Additional history obtained:  Additional history obtained from EMR External records from outside source obtained and reviewed including ***   Lab Tests:  I Ordered, and personally interpreted labs.  The pertinent results include:  ***   Imaging Studies ordered:  I ordered imaging studies including ***  I independently visualized and interpreted imaging which showed *** I agree with the radiologist interpretation   Cardiac Monitoring: / EKG:  The patient was maintained on a cardiac monitor.  I personally viewed and interpreted the cardiac monitored which showed an underlying rhythm of: ***   Problem List / ED Course / Critical interventions / Medication management  *** I ordered medication including ***   Reevaluation of the patient after these medicines showed that the patient *** I have reviewed the patients home medicines and have made adjustments as needed   Consultations Obtained:  I requested consultation with the ***,  and discussed lab  and imaging findings as well as pertinent plan - they recommend: ***   Social Determinants of Health:  ***   Test / Admission - Considered:  ***   {Document critical care time when appropriate  Document review of labs and clinical decision tools ie CHADS2VASC2, etc  Document your independent review of radiology images and any outside records  Document your discussion with family members, caretakers and with consultants  Document social determinants of health affecting pt's care  Document your decision making why or why not admission, treatments were needed:32947:::1}   Final diagnoses:  None    ED Discharge Orders     None

## 2024-08-24 NOTE — ED Triage Notes (Signed)
 Pt came in via POV d/t the past 2 days his CBG being >200. Pt came in to see if he could get some insulin prescribed to him so that I can eat some food. Was d/c from here d/t pancreatitis 4 days ago with a new diagnosis of diabetes. Also endorses recent trouble urinating like he's retaining, states his stream will last a couple of seconds then stop, has had abd pain rates 7/10. Also says he has had frequent diarrhea events daily & having back pain & feeling nauseous.

## 2024-08-24 NOTE — ED Provider Notes (Signed)
 [facility] Provider Note   CSN: [id number] Arrival date & time: 08/24/24  1816     Patient presents with: Hyperglycemia, Diarrhea, Abdominal Pain, Back Pain, and Nausea   Aaron Burke is a 38 y.o. male.  {Add pertinent medical, surgical, social history, OB history to HPI:5543} 38 year old male presents with concern for hyperglycemia and abdominal/back pain. Recently admitted to the hospital for pancreatitis, diagnosed with diabetes. Patient was dc without a meter or insulin/meds. He obtained a meter and has been checking his glucose at home, ranges form 180s-280s. Patient is not scheduled to be seen in clinic for a few more weeks, he called to ask for rx for insulin and was advised to go to the ER. 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 Unsure if he has had fevers, states he did feel warm one day but did not have a thermometer. Reports vomiting, diarrhea. No blood in emesis or stools.        Prior to Admission medications   Medication Sig Start Date End Date Taking? Authorizing Provider  acetaminophen  (TYLENOL ) 500 MG tablet Take 2 tablets (1,000 mg total) by mouth every 6 (six) hours as needed (pain). 08/20/24   Alena Morrison, Reagan, MD  DULoxetine  (CYMBALTA ) 30 MG capsule Take 1 capsule (30 mg total) by mouth daily. 08/20/24   Alena Morrison, Reagan, MD  folic acid (FOLVITE) 1 MG tablet Take 1 tablet (1 mg total) by mouth daily. 08/21/24   Alena Morrison, Reagan, MD  Multiple Vitamin (MULTIVITAMIN WITH MINERALS) TABS tablet Take 1 tablet by mouth daily. 08/21/24   Alena Morrison, Reagan, MD  oxyCODONE (OXY IR/ROXICODONE) 5 MG immediate release tablet Take 1 tablet (5 mg total) by mouth every 6 (six) hours as needed for severe pain (pain score 7-10). 08/20/24   Alena Morrison, Reagan, MD  pantoprazole  (PROTONIX ) 40 MG  tablet Take 1 tablet (40 mg total) by mouth daily. 08/21/24   Alena Morrison, Reagan, MD  PHENobarbital (LUMINAL) 32.4 MG tablet On 10/28, take one tab at 10 pm On 10/29, take one tab every 8 hours (3 times in the day) On 10/30 and 10/31, take one tab every 12 hours (2 times in the day) On 11/1 and 11/2, take one tab only (once in the day) 08/20/24   Alena Morrison, Reagan, MD  potassium & sodium phosphates (PHOS-NAK) 280-160-250 MG PACK Take 1 packet by mouth 4 (four) times daily -  with meals and at bedtime for 7 days. 08/20/24 08/27/24  Alena Morrison, Reagan, MD  thiamine  (VITAMIN B1) 100 MG tablet Take 1 tablet (100 mg total) by mouth daily. 08/21/24   Alena Morrison, Reagan, MD    Allergies: Percocet [oxycodone-acetaminophen ]    Review of Systems Negative except as per HPI Updated Vital Signs BP 115/86 (BP Location: Left Arm)   Pulse (!) 120   Temp 98.6 F (37 C)   Resp (!) 22   Ht 5' 9 (1.753 m)   Wt 68 kg   SpO2 99%   BMI 22.15 kg/m   Physical Exam Vitals and nursing note reviewed.  Constitutional:      General: He is not in acute distress.    Appearance: He is well-developed. He is not diaphoretic.  HENT:     Head: Normocephalic and atraumatic.  Cardiovascular:     Rate and Rhythm: Regular rhythm. Tachycardia present.  Heart sounds: Normal heart sounds.  Pulmonary:     Effort: Pulmonary effort is normal.     Breath sounds: Normal breath sounds.  Abdominal:     Palpations: Abdomen is soft.     Tenderness: There is abdominal tenderness. There is guarding.  Skin:    General: Skin is warm and dry.     Findings: No erythema or rash.  Neurological:     Mental Status: He is alert and oriented to person, place, and time.  Psychiatric:        Behavior: Behavior normal.     (all labs ordered are listed, but only abnormal results are displayed) Labs Reviewed  COMPREHENSIVE METABOLIC PANEL WITH GFR - Abnormal; Notable for the following components:       Result Value   Sodium 130 (*)    Potassium 3.1 (*)    Chloride 96 (*)    CO2 20 (*)    Glucose, Bld 193 (*)    BUN <5 (*)    Creatinine, Ser 0.53 (*)    Calcium 7.9 (*)    Total Protein 5.9 (*)    Albumin 2.1 (*)    AST 137 (*)    ALT 67 (*)    Alkaline Phosphatase 464 (*)    Total Bilirubin 1.7 (*)    All other components within normal limits  CBC - Abnormal; Notable for the following components:   WBC 15.9 (*)    RBC 3.30 (*)    Hemoglobin 11.0 (*)    HCT 32.7 (*)    Platelets 796 (*)    All other components within normal limits  CBG MONITORING, ED - Abnormal; Notable for the following components:   Glucose-Capillary 200 (*)    All other components within normal limits  CBG MONITORING, ED - Abnormal; Notable for the following components:   Glucose-Capillary 185 (*)    All other components within normal limits  I-STAT CHEM 8, ED - Abnormal; Notable for the following components:   Sodium 132 (*)    Potassium 2.9 (*)    Chloride 95 (*)    BUN <3 (*)    Creatinine, Ser 0.50 (*)    Glucose, Bld 184 (*)    Calcium, Ion 1.08 (*)    TCO2 20 (*)    Hemoglobin 10.2 (*)    HCT 30.0 (*)    All other components within normal limits  I-STAT VENOUS BLOOD GAS, ED - Abnormal; Notable for the following components:   pCO2, Ven 34.2 (*)    pO2, Ven 25 (*)    Acid-base deficit 3.0 (*)    Sodium 132 (*)    Potassium 3.1 (*)    Calcium, Ion 1.06 (*)    HCT 30.0 (*)    Hemoglobin 10.2 (*)    All other components within normal limits  LIPASE, BLOOD  URINALYSIS, ROUTINE W REFLEX MICROSCOPIC  ETHANOL  CK  CBG MONITORING, ED    EKG: None  Radiology: No results found.  {Document cardiac monitor, telemetry assessment procedure when appropriate:32947} Procedures   Medications Ordered in the ED  sodium chloride  0.9 % bolus 1,000 mL (has no administration in time range)  ondansetron  (ZOFRAN ) injection 4 mg (has no administration in time range)  HYDROmorphone  (DILAUDID )  injection 1 mg (has no administration in time range)  iohexol  (OMNIPAQUE ) 350 MG/ML injection 75 mL (75 mLs Intravenous Contrast Given 08/24/24 2302)      {Click here for ABCD2, HEART and other calculators REFRESH Note before signing:1}  Medical Decision Making Amount and/or Complexity of Data Reviewed Labs: ordered. Radiology: ordered.  Risk Prescription drug management.   This patient presents to the ED for concern of ***, this involves an extensive number of treatment options, and is a complaint that carries with it a high risk of complications and morbidity.  The differential diagnosis includes ***   Co morbidities / Chronic conditions that complicate the patient evaluation  ***   Additional history obtained:  Additional history obtained from EMR External records from outside source obtained and reviewed including ***   Lab Tests:  I Ordered, and personally interpreted labs.  The pertinent results include:  ***   Imaging Studies ordered:  I ordered imaging studies including ***  I independently visualized and interpreted imaging which showed *** I agree with the radiologist interpretation   Cardiac Monitoring: / EKG:  The patient was maintained on a cardiac monitor.  I personally viewed and interpreted the cardiac monitored which showed an underlying rhythm of: ***   Problem List / ED Course / Critical interventions / Medication management  *** I ordered medication including ***   Reevaluation of the patient after these medicines showed that the patient *** I have reviewed the patients home medicines and have made adjustments as needed   Consultations Obtained:  I requested consultation with the ***,  and discussed lab and imaging findings as well as pertinent plan - they recommend: ***   Social Determinants of Health:  ***   Test / Admission - Considered:  ***   {Document critical care time when appropriate   Document review of labs and clinical decision tools ie CHADS2VASC2, etc  Document your independent review of radiology images and any outside records  Document your discussion with family members, caretakers and with consultants  Document social determinants of health affecting pt's care  Document your decision making why or why not admission, treatments were needed:32947:::1}   Final diagnoses:  None    ED Discharge Orders     None

## 2024-08-25 DIAGNOSIS — K861 Other chronic pancreatitis: Secondary | ICD-10-CM | POA: Diagnosis present

## 2024-08-25 LAB — URINALYSIS, ROUTINE W REFLEX MICROSCOPIC
Bilirubin Urine: NEGATIVE
Glucose, UA: 50 mg/dL — AB
Hgb urine dipstick: NEGATIVE
Ketones, ur: NEGATIVE mg/dL
Leukocytes,Ua: NEGATIVE
Nitrite: NEGATIVE
Protein, ur: NEGATIVE mg/dL
Specific Gravity, Urine: >1.046 — ABNORMAL HIGH (ref 1.005–1.030)
pH: 5 (ref 5.0–8.0)

## 2024-08-25 MED ORDER — HYDROMORPHONE HCL 1 MG/ML IJ SOLN
1.0000 mg | Freq: Once | INTRAMUSCULAR | Status: AC
Start: 2024-08-25 — End: 2024-08-25
  Administered 2024-08-25: 1 mg via INTRAVENOUS
  Filled 2024-08-25: qty 1

## 2024-08-25 MED ORDER — INSULIN GLARGINE 100 UNIT/ML ~~LOC~~ SOLN: 5.0000 [IU] | Freq: Every day | SUBCUTANEOUS | 0 refills | Status: AC

## 2024-08-25 MED ORDER — ONDANSETRON HCL 4 MG/2ML IJ SOLN
4.0000 mg | Freq: Once | INTRAMUSCULAR | Status: AC
  Administered 2024-08-25: 4 mg via INTRAVENOUS
  Filled 2024-08-25: qty 2

## 2024-08-25 MED ORDER — OXYCODONE HCL 5 MG PO TABS: 5.0000 mg | ORAL_TABLET | Freq: Four times a day (QID) | ORAL | 0 refills | Status: DC | PRN

## 2024-08-25 MED ORDER — SODIUM CHLORIDE 0.9 % IV BOLUS
1000.0000 mL | Freq: Once | INTRAVENOUS | Status: AC
  Administered 2024-08-25: 1000 mL via INTRAVENOUS

## 2024-08-25 NOTE — ED Notes (Signed)
 Discharge instructions reviewed.   Newly prescribed medications discussed. Pharmacy verified.   Opportunity for questions and concerns provided.   Alert, oriented and ambulatory.   Displays no signs of distress.

## 2024-08-25 NOTE — Plan of Care (Signed)
 FMTS Interim Progress Note  Paged by the ED regarding discharge of this patient and after care given hyperglycemia with T2DM diagnosis.  Patient does not follow-up at [facility] clinic, has independent PCP and the community with follow-up appointment scheduled on 11/12.  Admitted on FM TS service from 10/23 until 10/28.  ED provider wondering about discharging on insulin, if they feel he is appropriate for discharge would recommend following up closely with PCP early next week and sending on a small amount of long-acting insulin 5 units daily with frequent CBG checks to ensure not experiencing hypoglycemia.  Will defer to them for further discharge management and education with the patient.  Theophilus Pagan, MD 08/25/2024, 6:58 AM PGY-3, [facility] Service pager [phone]

## 2024-08-25 NOTE — ED Notes (Signed)
 Pt remains unsuccessful with providing urine sample.

## 2024-08-25 NOTE — Discharge Instructions (Addendum)
 Call your PCP office on Monday and advised them that you have started on insulin.  They may be able to see you sooner to follow this. Return to the ER as needed.  Limited use of narcotic pain medication. Insulin once daily as prescribed. Monitor your blood sugar, record results and take to PCP for follow-up.

## 2024-09-02 ENCOUNTER — Encounter (HOSPITAL_COMMUNITY): Payer: Self-pay

## 2024-09-02 ENCOUNTER — Inpatient Hospital Stay (HOSPITAL_COMMUNITY)
Admission: EM | Admit: 2024-09-02 | Discharge: 2024-09-09 | DRG: 438 | Disposition: A | Discharge status: Home / Self Care | Payer: MEDICAID | Attending: Emergency Medicine | Admitting: Emergency Medicine

## 2024-09-02 ENCOUNTER — Emergency Department (HOSPITAL_COMMUNITY): Payer: MEDICAID

## 2024-09-02 ENCOUNTER — Other Ambulatory Visit: Payer: Self-pay

## 2024-09-02 ENCOUNTER — Other Ambulatory Visit (HOSPITAL_COMMUNITY): Payer: Self-pay | Admitting: Radiology

## 2024-09-02 DIAGNOSIS — K7010 Alcoholic hepatitis without ascites: Secondary | ICD-10-CM | POA: Diagnosis present

## 2024-09-02 DIAGNOSIS — K861 Other chronic pancreatitis: Secondary | ICD-10-CM | POA: Diagnosis present

## 2024-09-02 DIAGNOSIS — R748 Abnormal levels of other serum enzymes: Secondary | ICD-10-CM | POA: Diagnosis not present

## 2024-09-02 DIAGNOSIS — E871 Hypo-osmolality and hyponatremia: Secondary | ICD-10-CM | POA: Diagnosis present

## 2024-09-02 DIAGNOSIS — F4310 Post-traumatic stress disorder, unspecified: Secondary | ICD-10-CM | POA: Diagnosis present

## 2024-09-02 DIAGNOSIS — K658 Other peritonitis: Secondary | ICD-10-CM | POA: Diagnosis present

## 2024-09-02 DIAGNOSIS — E8729 Other acidosis: Secondary | ICD-10-CM

## 2024-09-02 DIAGNOSIS — Z9049 Acquired absence of other specified parts of digestive tract: Secondary | ICD-10-CM

## 2024-09-02 DIAGNOSIS — R1013 Epigastric pain: Secondary | ICD-10-CM

## 2024-09-02 DIAGNOSIS — K219 Gastro-esophageal reflux disease without esophagitis: Secondary | ICD-10-CM | POA: Diagnosis present

## 2024-09-02 DIAGNOSIS — Z885 Allergy status to narcotic agent status: Secondary | ICD-10-CM

## 2024-09-02 DIAGNOSIS — Z9151 Personal history of suicidal behavior: Secondary | ICD-10-CM | POA: Diagnosis not present

## 2024-09-02 DIAGNOSIS — E785 Hyperlipidemia, unspecified: Secondary | ICD-10-CM | POA: Diagnosis present

## 2024-09-02 DIAGNOSIS — K852 Alcohol induced acute pancreatitis without necrosis or infection: Principal | ICD-10-CM | POA: Diagnosis present

## 2024-09-02 DIAGNOSIS — Z87891 Personal history of nicotine dependence: Secondary | ICD-10-CM | POA: Diagnosis not present

## 2024-09-02 DIAGNOSIS — D649 Anemia, unspecified: Secondary | ICD-10-CM | POA: Diagnosis present

## 2024-09-02 DIAGNOSIS — Z8349 Family history of other endocrine, nutritional and metabolic diseases: Secondary | ICD-10-CM

## 2024-09-02 DIAGNOSIS — F32A Depression, unspecified: Secondary | ICD-10-CM | POA: Diagnosis present

## 2024-09-02 DIAGNOSIS — D638 Anemia in other chronic diseases classified elsewhere: Secondary | ICD-10-CM | POA: Diagnosis present

## 2024-09-02 DIAGNOSIS — B379 Candidiasis, unspecified: Secondary | ICD-10-CM | POA: Diagnosis not present

## 2024-09-02 DIAGNOSIS — F419 Anxiety disorder, unspecified: Secondary | ICD-10-CM | POA: Diagnosis present

## 2024-09-02 DIAGNOSIS — Z8249 Family history of ischemic heart disease and other diseases of the circulatory system: Secondary | ICD-10-CM

## 2024-09-02 DIAGNOSIS — Z794 Long term (current) use of insulin: Secondary | ICD-10-CM

## 2024-09-02 DIAGNOSIS — Z79899 Other long term (current) drug therapy: Secondary | ICD-10-CM

## 2024-09-02 DIAGNOSIS — K859 Acute pancreatitis without necrosis or infection, unspecified: Principal | ICD-10-CM | POA: Diagnosis present

## 2024-09-02 DIAGNOSIS — L4050 Arthropathic psoriasis, unspecified: Secondary | ICD-10-CM | POA: Diagnosis present

## 2024-09-02 DIAGNOSIS — F1010 Alcohol abuse, uncomplicated: Secondary | ICD-10-CM | POA: Diagnosis present

## 2024-09-02 DIAGNOSIS — L400 Psoriasis vulgaris: Secondary | ICD-10-CM | POA: Diagnosis present

## 2024-09-02 DIAGNOSIS — Y908 Blood alcohol level of 240 mg/100 ml or more: Secondary | ICD-10-CM | POA: Diagnosis present

## 2024-09-02 DIAGNOSIS — G4700 Insomnia, unspecified: Secondary | ICD-10-CM | POA: Diagnosis present

## 2024-09-02 DIAGNOSIS — K863 Pseudocyst of pancreas: Secondary | ICD-10-CM | POA: Diagnosis present

## 2024-09-02 DIAGNOSIS — K8689 Other specified diseases of pancreas: Secondary | ICD-10-CM

## 2024-09-02 DIAGNOSIS — K860 Alcohol-induced chronic pancreatitis: Secondary | ICD-10-CM | POA: Diagnosis present

## 2024-09-02 DIAGNOSIS — E1165 Type 2 diabetes mellitus with hyperglycemia: Secondary | ICD-10-CM | POA: Diagnosis present

## 2024-09-02 DIAGNOSIS — F1090 Alcohol use, unspecified, uncomplicated: Secondary | ICD-10-CM | POA: Diagnosis not present

## 2024-09-02 DIAGNOSIS — Z789 Other specified health status: Secondary | ICD-10-CM

## 2024-09-02 LAB — BASIC METABOLIC PANEL WITH GFR
Anion gap: 14 (ref 5–15)
Anion gap: 12 (ref 5–15)
BUN: <5 mg/dL — ABNORMAL LOW (ref 6–20)
BUN: <5 mg/dL — ABNORMAL LOW (ref 6–20)
CO2: 20 mmol/L — ABNORMAL LOW (ref 22–32)
CO2: 21 mmol/L — ABNORMAL LOW (ref 22–32)
Calcium: 7.7 mg/dL — ABNORMAL LOW (ref 8.9–10.3)
Calcium: 7.8 mg/dL — ABNORMAL LOW (ref 8.9–10.3)
Chloride: 101 mmol/L (ref 98–111)
Chloride: 100 mmol/L (ref 98–111)
Creatinine, Ser: 0.58 mg/dL — ABNORMAL LOW (ref 0.61–1.24)
Creatinine, Ser: 0.51 mg/dL — ABNORMAL LOW (ref 0.61–1.24)
GFR, Estimated: >60 mL/min (ref >60)
GFR, Estimated: >60 mL/min (ref >60)
Glucose, Bld: 143 mg/dL — ABNORMAL HIGH (ref 70–99)
Glucose, Bld: 158 mg/dL — ABNORMAL HIGH (ref 70–99)
Potassium: 3.9 mmol/L (ref 3.5–5.1)
Potassium: 4 mmol/L (ref 3.5–5.1)
Sodium: 135 mmol/L (ref 135–145)
Sodium: 133 mmol/L — ABNORMAL LOW (ref 135–145)

## 2024-09-02 LAB — I-STAT VENOUS BLOOD GAS, ED
Acid-base deficit: 2 mmol/L (ref 0.0–2.0)
Bicarbonate: 20.7 mmol/L (ref 20.0–28.0)
Calcium, Ion: 1 mmol/L — ABNORMAL LOW (ref 1.15–1.40)
HCT: 33 % — ABNORMAL LOW (ref 39.0–52.0)
Hemoglobin: 11.2 g/dL — ABNORMAL LOW (ref 13.0–17.0)
O2 Saturation: 80 %
Potassium: 3.7 mmol/L (ref 3.5–5.1)
Sodium: 134 mmol/L — ABNORMAL LOW (ref 135–145)
TCO2: 22 mmol/L (ref 22–32)
pCO2, Ven: 29.1 mmHg — ABNORMAL LOW (ref 44–60)
pH, Ven: 7.461 — ABNORMAL HIGH (ref 7.25–7.43)
pO2, Ven: 41 mmHg (ref 32–45)

## 2024-09-02 LAB — ETHANOL: Alcohol, Ethyl (B): <15 mg/dL (ref <15)

## 2024-09-02 LAB — URINALYSIS, ROUTINE W REFLEX MICROSCOPIC
Bilirubin Urine: NEGATIVE
Glucose, UA: 50 mg/dL — AB
Hgb urine dipstick: NEGATIVE
Ketones, ur: NEGATIVE mg/dL
Leukocytes,Ua: NEGATIVE
Nitrite: NEGATIVE
Protein, ur: NEGATIVE mg/dL
Specific Gravity, Urine: 1.045 — ABNORMAL HIGH (ref 1.005–1.030)
pH: 5 (ref 5.0–8.0)

## 2024-09-02 LAB — COMPREHENSIVE METABOLIC PANEL WITH GFR
ALT: 31 U/L (ref 0–44)
AST: 82 U/L — ABNORMAL HIGH (ref 15–41)
Albumin: 2.4 g/dL — ABNORMAL LOW (ref 3.5–5.0)
Alkaline Phosphatase: 261 U/L — ABNORMAL HIGH (ref 38–126)
Anion gap: 16 — ABNORMAL HIGH (ref 5–15)
BUN: 5 mg/dL — ABNORMAL LOW (ref 6–20)
CO2: 19 mmol/L — ABNORMAL LOW (ref 22–32)
Calcium: 8.5 mg/dL — ABNORMAL LOW (ref 8.9–10.3)
Chloride: 100 mmol/L (ref 98–111)
Creatinine, Ser: 0.67 mg/dL (ref 0.61–1.24)
GFR, Estimated: >60 mL/min (ref >60)
Glucose, Bld: 204 mg/dL — ABNORMAL HIGH (ref 70–99)
Potassium: 4.2 mmol/L (ref 3.5–5.1)
Sodium: 135 mmol/L (ref 135–145)
Total Bilirubin: 1.7 mg/dL — ABNORMAL HIGH (ref 0.0–1.2)
Total Protein: 6.3 g/dL — ABNORMAL LOW (ref 6.5–8.1)

## 2024-09-02 LAB — CBG MONITORING, ED
Glucose-Capillary: 192 mg/dL — ABNORMAL HIGH (ref 70–99)
Glucose-Capillary: 136 mg/dL — ABNORMAL HIGH (ref 70–99)
Glucose-Capillary: 154 mg/dL — ABNORMAL HIGH (ref 70–99)
Glucose-Capillary: 106 mg/dL — ABNORMAL HIGH (ref 70–99)
Glucose-Capillary: 142 mg/dL — ABNORMAL HIGH (ref 70–99)
Glucose-Capillary: 161 mg/dL — ABNORMAL HIGH (ref 70–99)
Glucose-Capillary: 119 mg/dL — ABNORMAL HIGH (ref 70–99)
Glucose-Capillary: 121 mg/dL — ABNORMAL HIGH (ref 70–99)

## 2024-09-02 LAB — CBC
HCT: 36.7 % — ABNORMAL LOW (ref 39.0–52.0)
Hemoglobin: 12 g/dL — ABNORMAL LOW (ref 13.0–17.0)
MCH: 32.3 pg (ref 26.0–34.0)
MCHC: 32.7 g/dL (ref 30.0–36.0)
MCV: 98.9 fL (ref 80.0–100.0)
Platelets: 558 K/uL — ABNORMAL HIGH (ref 150–400)
RBC: 3.71 MIL/uL — ABNORMAL LOW (ref 4.22–5.81)
RDW: 13.1 % (ref 11.5–15.5)
WBC: 15.3 K/uL — ABNORMAL HIGH (ref 4.0–10.5)
nRBC: 0 % (ref 0.0–0.2)

## 2024-09-02 LAB — I-STAT CHEM 8, ED
BUN: <3 mg/dL — ABNORMAL LOW (ref 6–20)
Calcium, Ion: 0.92 mmol/L — ABNORMAL LOW (ref 1.15–1.40)
Chloride: 103 mmol/L (ref 98–111)
Creatinine, Ser: 0.4 mg/dL — ABNORMAL LOW (ref 0.61–1.24)
Glucose, Bld: 183 mg/dL — ABNORMAL HIGH (ref 70–99)
HCT: 32 % — ABNORMAL LOW (ref 39.0–52.0)
Hemoglobin: 10.9 g/dL — ABNORMAL LOW (ref 13.0–17.0)
Potassium: 5.8 mmol/L — ABNORMAL HIGH (ref 3.5–5.1)
Sodium: 133 mmol/L — ABNORMAL LOW (ref 135–145)
TCO2: 22 mmol/L (ref 22–32)

## 2024-09-02 LAB — LIPASE, BLOOD: Lipase: 80 U/L — ABNORMAL HIGH (ref 11–51)

## 2024-09-02 MED ORDER — SODIUM CHLORIDE 0.9 % IV SOLN: Freq: Once | INTRAVENOUS | Status: AC

## 2024-09-02 MED ORDER — ONDANSETRON 4 MG PO TBDP
4.0000 mg | ORAL_TABLET | Freq: Once | ORAL | Status: AC
  Administered 2024-09-02: 4 mg via ORAL
  Filled 2024-09-02: qty 1

## 2024-09-02 MED ORDER — IOHEXOL 350 MG/ML SOLN
75.0000 mL | Freq: Once | INTRAVENOUS | Status: AC | PRN
  Administered 2024-09-02: 75 mL via INTRAVENOUS

## 2024-09-02 MED ORDER — ONDANSETRON HCL 4 MG/2ML IJ SOLN
4.0000 mg | Freq: Four times a day (QID) | INTRAMUSCULAR | Status: DC
  Administered 2024-09-02 – 2024-09-06 (×15): 4 mg via INTRAVENOUS
  Filled 2024-09-02 (×15): qty 2

## 2024-09-02 MED ORDER — DEXTROSE IN LACTATED RINGERS 5 % IV SOLN: INTRAVENOUS | Status: DC

## 2024-09-02 MED ORDER — INSULIN ASPART 100 UNIT/ML IJ SOLN
0.0000 [IU] | Freq: Three times a day (TID) | INTRAMUSCULAR | Status: DC
  Administered 2024-09-03: 1 [IU] via SUBCUTANEOUS
  Administered 2024-09-03 (×2): 2 [IU] via SUBCUTANEOUS
  Administered 2024-09-04: 1 [IU] via SUBCUTANEOUS
  Administered 2024-09-05: 2 [IU] via SUBCUTANEOUS
  Administered 2024-09-05 – 2024-09-06 (×4): 1 [IU] via SUBCUTANEOUS
  Administered 2024-09-07: 2 [IU] via SUBCUTANEOUS
  Administered 2024-09-07 – 2024-09-09 (×4): 1 [IU] via SUBCUTANEOUS
  Filled 2024-09-02: qty 1
  Filled 2024-09-02: qty 2
  Filled 2024-09-02 (×3): qty 1
  Filled 2024-09-02 (×2): qty 2
  Filled 2024-09-02 (×3): qty 1
  Filled 2024-09-02 (×3): qty 2
  Filled 2024-09-02: qty 1

## 2024-09-02 MED ORDER — LACTATED RINGERS IV BOLUS
20.0000 mL/kg | Freq: Once | INTRAVENOUS | Status: AC
  Administered 2024-09-02: 1360 mL via INTRAVENOUS

## 2024-09-02 MED ORDER — LACTATED RINGERS IV SOLN: INTRAVENOUS | Status: DC

## 2024-09-02 MED ORDER — ONDANSETRON 4 MG PO TBDP
4.0000 mg | ORAL_TABLET | Freq: Three times a day (TID) | ORAL | Status: DC | PRN
  Administered 2024-09-02: 4 mg via ORAL
  Filled 2024-09-02: qty 1

## 2024-09-02 MED ORDER — DEXTROSE 50 % IV SOLN: 0.0000 mL | INTRAVENOUS | Status: DC | PRN

## 2024-09-02 MED ORDER — THIAMINE HCL 100 MG/ML IJ SOLN
100.0000 mg | Freq: Every day | INTRAMUSCULAR | Status: DC
  Filled 2024-09-02: qty 2

## 2024-09-02 MED ORDER — LACTATED RINGERS IV SOLN: INTRAVENOUS | Status: AC

## 2024-09-02 MED ORDER — SODIUM CHLORIDE 0.9 % IV BOLUS
1000.0000 mL | Freq: Once | INTRAVENOUS | Status: AC
  Administered 2024-09-02: 1000 mL via INTRAVENOUS

## 2024-09-02 MED ORDER — POTASSIUM CHLORIDE 10 MEQ/100ML IV SOLN
10.0000 meq | INTRAVENOUS | Status: AC
Start: 2024-09-02 — End: 2024-09-02
  Administered 2024-09-02 (×3): 10 meq via INTRAVENOUS
  Filled 2024-09-02 (×3): qty 100

## 2024-09-02 MED ORDER — HYDROMORPHONE HCL 1 MG/ML IJ SOLN
1.0000 mg | Freq: Once | INTRAMUSCULAR | Status: AC
  Administered 2024-09-02: 1 mg via INTRAVENOUS
  Filled 2024-09-02: qty 1

## 2024-09-02 MED ORDER — PROMETHAZINE (PHENERGAN) 6.25MG IN NS 50ML IVPB
6.2500 mg | Freq: Four times a day (QID) | INTRAVENOUS | Status: DC | PRN
  Administered 2024-09-05: 6.25 mg via INTRAVENOUS
  Filled 2024-09-02: qty 6.25

## 2024-09-02 MED ORDER — FOLIC ACID 1 MG PO TABS
1.0000 mg | ORAL_TABLET | Freq: Every day | ORAL | Status: DC
  Administered 2024-09-02 – 2024-09-09 (×8): 1 mg via ORAL
  Filled 2024-09-02 (×9): qty 1

## 2024-09-02 MED ORDER — INSULIN REGULAR(HUMAN) IN NACL 100-0.9 UT/100ML-% IV SOLN
INTRAVENOUS | Status: DC
  Administered 2024-09-02: 3.6 [IU]/h via INTRAVENOUS
  Filled 2024-09-02: qty 100

## 2024-09-02 MED ORDER — ADULT MULTIVITAMIN W/MINERALS CH
1.0000 | ORAL_TABLET | Freq: Every day | ORAL | Status: DC
  Administered 2024-09-02 – 2024-09-09 (×8): 1 via ORAL
  Filled 2024-09-02 (×8): qty 1

## 2024-09-02 MED ORDER — HYDROMORPHONE HCL 1 MG/ML IJ SOLN
0.5000 mg | INTRAMUSCULAR | Status: DC | PRN
  Administered 2024-09-02 – 2024-09-05 (×13): 0.5 mg via INTRAVENOUS
  Filled 2024-09-02 (×2): qty 1
  Filled 2024-09-02 (×2): qty 0.5
  Filled 2024-09-02: qty 1
  Filled 2024-09-02 (×3): qty 0.5
  Filled 2024-09-02: qty 1
  Filled 2024-09-02 (×2): qty 0.5
  Filled 2024-09-02: qty 1
  Filled 2024-09-02: qty 0.5

## 2024-09-02 MED ORDER — OXYCODONE HCL 5 MG PO TABS
5.0000 mg | ORAL_TABLET | ORAL | Status: DC | PRN
  Administered 2024-09-02: 5 mg via ORAL
  Administered 2024-09-02 – 2024-09-04 (×8): 10 mg via ORAL
  Administered 2024-09-04: 5 mg via ORAL
  Administered 2024-09-04 – 2024-09-05 (×3): 10 mg via ORAL
  Filled 2024-09-02: qty 1
  Filled 2024-09-02 (×4): qty 2
  Filled 2024-09-02: qty 1
  Filled 2024-09-02 (×7): qty 2

## 2024-09-02 MED ORDER — FENTANYL CITRATE (PF) 50 MCG/ML IJ SOSY
50.0000 ug | PREFILLED_SYRINGE | Freq: Once | INTRAMUSCULAR | Status: AC
  Administered 2024-09-02: 50 ug via INTRAVENOUS
  Filled 2024-09-02: qty 1

## 2024-09-02 MED ORDER — THIAMINE MONONITRATE 100 MG PO TABS
100.0000 mg | ORAL_TABLET | Freq: Every day | ORAL | Status: DC
  Administered 2024-09-02 – 2024-09-09 (×8): 100 mg via ORAL
  Filled 2024-09-02 (×8): qty 1

## 2024-09-02 NOTE — ED Notes (Signed)
 Verbal order from [facility] DO to discontinue insulin drip/endotool and verbalizes to hold last run of ordered iv potassium and dc BMP for 2200.

## 2024-09-02 NOTE — Hospital Course (Addendum)
 Aaron Burke is a 38 y.o. male with history of alcohol use disorder, PTSD, gallstones (s/p cholecystectomy) who was admitted for pancreatitis.  Complicated Pancreatitis Pseudocyst of pancreas History of gallstone pancreatitis in 2020, s/p cholecystectomy.  Recent admission in October 2025 for pancreatitis with pseudocyst.  Patient returned this admission with severe epigastric pain and inability to p.o., repeat CT scan shows ongoing peritonitis with enlarging pseudocyst. GI was consulted who recommended MRCP.  MRCP showed findings consistent with acute pancreatitis with multiple pseudocysts. Did also show a retroperitoneal fluid collection. Hgb dropped from 12 on 11/10 to 7.5 on 11/11 and patient began to fever; blood cultures were collected and he was started on IV Zosyn (11/11-11/12), later switched to IV meropenem at GI's recommendation (11/12-11/14). Underwent aspiration of pseudocyst with IR on 11/12; pseudocyst aspiration culture grew yeast. Given this, ID was consulted, who recommended micafungin, d/c meropenem. ***transitioned to fluconazole on.  Pain was managed and diet progressed throughout admission.  Anion gap T2DM Differential included DKA and starvation ketoacidosis, however no ketones in urine nor acidotic on VBG.  Initially received 1L fluid bolus, then transitioned to T2DM.  Was placed on Endo tool, quickly transitioned off. Maintained on SSI while inpatient.  Other chronic conditions were medically managed with home medications and formulary alternatives as necessary: (Alcohol use disorder with elevated liver enzymes, GERD, insomnia)  PCP Follow-up recommendations: Repeat imaging of pancreatic ductal dilation after recovered with either EUS or MRCP  Patient will need follow-up EGD to evaluate duodenal mucosa (abnormality seen on MRI while inpatient) once recovered from acute course

## 2024-09-02 NOTE — ED Provider Notes (Signed)
 [place] EMERGENCY DEPARTMENT AT [facility] Provider Note   CSN: [id number] Arrival date & time: 09/02/24  9343     Patient presents with: Abdominal Pain   Aaron Burke is a 38 y.o. male.   Patient here upper mental pain after eating some food last night that was spicy he states.  Feels like this is his pancreatitis.  He has been nauseous.  Some diarrhea.  Comes in feeling really nauseous and a lot of pain.  History of reflux gallstone pancreatitis.  Socially drinks alcohol.  He feels like spicy Indian food may be causes last night.  Denies any weakness numbness tingling.  The history is provided by the patient.       Prior to Admission medications   Medication Sig Start Date End Date Taking? Authorizing Provider  acetaminophen  (TYLENOL ) 500 MG tablet Take 2 tablets (1,000 mg total) by mouth every 6 (six) hours as needed (pain). 08/20/24   Alena Morrison, Reagan, MD  DULoxetine  (CYMBALTA ) 30 MG capsule Take 1 capsule (30 mg total) by mouth daily. 08/20/24   Alena Morrison, Reagan, MD  folic acid (FOLVITE) 1 MG tablet Take 1 tablet (1 mg total) by mouth daily. 08/21/24   Alena Morrison, Reagan, MD  insulin glargine (LANTUS) 100 UNIT/ML injection Inject 0.05 mLs (5 Units total) into the skin daily. 08/25/24   Beverley Leita LABOR, PA-C  Multiple Vitamin (MULTIVITAMIN WITH MINERALS) TABS tablet Take 1 tablet by mouth daily. 08/21/24   Alena Morrison, Reagan, MD  oxyCODONE (OXY IR/ROXICODONE) 5 MG immediate release tablet Take 1 tablet (5 mg total) by mouth every 6 (six) hours as needed for severe pain (pain score 7-10). 08/25/24   Beverley Leita LABOR, PA-C  pantoprazole  (PROTONIX ) 40 MG tablet Take 1 tablet (40 mg total) by mouth daily. 08/21/24   Alena Morrison, Reagan, MD  PHENobarbital (LUMINAL) 32.4 MG tablet On 10/28, take one tab at 10 pm On 10/29, take one tab every 8 hours (3 times in the day) On 10/30 and 10/31, take one tab every 12 hours (2 times in the  day) On 11/1 and 11/2, take one tab only (once in the day) 08/20/24   Alena Morrison, Reagan, MD  thiamine  (VITAMIN B1) 100 MG tablet Take 1 tablet (100 mg total) by mouth daily. 08/21/24   Alena Morrison, Reagan, MD    Allergies: Percocet [oxycodone-acetaminophen ]    Review of Systems  Updated Vital Signs BP 129/87   Pulse (!) 104   Temp 99.4 F (37.4 C) (Oral)   Resp 18   Ht 5' 9 (1.753 m)   Wt 68 kg   SpO2 100%   BMI 22.14 kg/m   Physical Exam Vitals and nursing note reviewed.  Constitutional:      General: He is not in acute distress.    Appearance: He is well-developed. He is not ill-appearing.  HENT:     Head: Normocephalic and atraumatic.     Mouth/Throat:     Mouth: Mucous membranes are moist.  Eyes:     Extraocular Movements: Extraocular movements intact.     Conjunctiva/sclera: Conjunctivae normal.     Pupils: Pupils are equal, round, and reactive to light.  Cardiovascular:     Rate and Rhythm: Normal rate and regular rhythm.     Heart sounds: Normal heart sounds. No murmur heard. Pulmonary:     Effort: Pulmonary effort is normal. No respiratory distress.     Breath sounds: Normal breath sounds.  Abdominal:  General: Abdomen is flat.     Palpations: Abdomen is soft.     Tenderness: There is abdominal tenderness in the epigastric area.  Musculoskeletal:        General: No swelling.     Cervical back: Neck supple.  Skin:    General: Skin is warm and dry.     Capillary Refill: Capillary refill takes less than 2 seconds.  Neurological:     General: No focal deficit present.     Mental Status: He is alert.  Psychiatric:        Mood and Affect: Mood normal.     (all labs ordered are listed, but only abnormal results are displayed) Labs Reviewed  LIPASE, BLOOD - Abnormal; Notable for the following components:      Result Value   Lipase 80 (*)    All other components within normal limits  COMPREHENSIVE METABOLIC PANEL WITH GFR - Abnormal;  Notable for the following components:   CO2 19 (*)    BUN 5 (*)    Calcium 8.5 (*)    Total Protein 6.3 (*)    Albumin 2.4 (*)    AST 82 (*)    Alkaline Phosphatase 261 (*)    Total Bilirubin 1.7 (*)    Anion gap 16 (*)    All other components within normal limits  CBC - Abnormal; Notable for the following components:   WBC 15.3 (*)    RBC 3.71 (*)    Hemoglobin 12.0 (*)    HCT 36.7 (*)    Platelets 558 (*)    All other components within normal limits  CBG MONITORING, ED - Abnormal; Notable for the following components:   Glucose-Capillary 192 (*)    All other components within normal limits  I-STAT CHEM 8, ED - Abnormal; Notable for the following components:   Sodium 133 (*)    Potassium 5.8 (*)    BUN <3 (*)    Creatinine, Ser 0.40 (*)    Glucose, Bld 183 (*)    Calcium, Ion 0.92 (*)    Hemoglobin 10.9 (*)    HCT 32.0 (*)    All other components within normal limits  URINALYSIS, ROUTINE W REFLEX MICROSCOPIC    EKG: EKG Interpretation Date/Time:  Monday September 02 2024 07:22:58 EST Ventricular Rate:  130 PR Interval:  132 QRS Duration:  66 QT Interval:  314 QTC Calculation: 462 R Axis:   98  Text Interpretation: Sinus tachycardia Rightward axis Confirmed by Ruthe Cornet [phone]) on 09/02/2024 8:07:21 AM  Radiology: CT ABDOMEN PELVIS W CONTRAST Result Date: 09/02/2024 CLINICAL DATA:  Abdominal pain. EXAM: CT ABDOMEN AND PELVIS WITH CONTRAST TECHNIQUE: Multidetector CT imaging of the abdomen and pelvis was performed using the standard protocol following bolus administration of intravenous contrast. RADIATION DOSE REDUCTION: This exam was performed according to the departmental dose-optimization program which includes automated exposure control, adjustment of the mA and/or kV according to patient size and/or use of iterative reconstruction technique. CONTRAST:  75mL OMNIPAQUE  IOHEXOL  350 MG/ML SOLN COMPARISON:  08/24/2024 FINDINGS: Lower chest: Asymmetric elevation  right hemidiaphragm with basilar atelectasis, right greater than left. Hepatobiliary: No suspicious focal abnormality within the liver parenchyma. Heterogeneity in the anterior hepatic dome is probably related to geographic fatty deposition. Gallbladder is surgically absent. No intrahepatic or extrahepatic biliary dilation. Pancreas: Peripancreatic edema/inflammation evident most prominent along the head and body of pancreas. These extraperitoneal inflammatory changes track inferiorly towards the pelvis. Pancreatic parenchyma enhances throughout. Interval progression of ill-defined complex  fluid collections in the right retroperitoneal space. Collection posterior to the ascending colon measures 5.1 x 2.8 cm today compared to 3.8 x 2.1 cm when remeasured in a similar fashion on the prior study. Collection between the duodenum and right kidney on 50/3 measures 4.4 x 2.8 cm today compared to 4.0 x 1.8 cm previously. Additional ill-defined collections are seen anterior to the right psoas muscle tracking down towards the pelvis, similar to prior. Spleen: No splenomegaly. No suspicious focal mass lesion. Adrenals/Urinary Tract: No adrenal nodule or mass. Areas of ill-defined hypoperfusion in the inferior right kidney (52/3 and 55/3) are new in the interval. Left kidney unremarkable. No evidence for hydroureter. Mild bladder wall thickening noted anteriorly. Stomach/Bowel: Stomach is unremarkable. No gastric wall thickening. No evidence of outlet obstruction. Duodenum is normally positioned as is the ligament of Treitz. No small bowel wall thickening. No small bowel dilatation. The terminal ileum is normal. The appendix is not well visualized, but there is no edema or inflammation in the region of the cecal tip to suggest appendicitis. Edema/inflammation is seen along the course of the proximal sigmoid colon. Vascular/Lymphatic: Portal vein, superior mesenteric vein, and splenic vein remain patent. No acute abnormality of  the celiac axis or SMA. There is no gastrohepatic or hepatoduodenal ligament lymphadenopathy. No retroperitoneal or mesenteric lymphadenopathy. No pelvic sidewall lymphadenopathy. Reproductive: The prostate gland and seminal vesicles are unremarkable. Other: Trace free fluid is seen in the paracolic gutters bilaterally. Musculoskeletal: No worrisome lytic or sclerotic osseous abnormality. IMPRESSION: 1. Interval progression of ill-defined complex fluid collections in the right retroperitoneal space. Collection posterior to the ascending colon measures 5.1 x 2.8 cm today compared to 3.8 x 2.1 cm when remeasured in a similar fashion on the prior study. Collection between the duodenum and right kidney measures 4.4 x 2.8 cm today compared to 4.0 x 1.8 cm previously. These are compatible with evolving pseudocysts. Additional ill-defined collections are seen anterior to the right psoas muscle tracking down towards the pelvis, similar to prior. 2. Areas of ill-defined hypoperfusion in the inferior right kidney are new in the interval. Imaging features may be secondary to the adjacent inflammatory changes although pyelonephritis could have this appearance. 3. Edema/inflammation along the course of the proximal sigmoid colon. This is likely secondary to the pancreatic inflammatory process. 4. Mild bladder wall thickening anteriorly. Correlation with urinalysis suggested. 5. Asymmetric elevation right hemidiaphragm with basilar atelectasis, right greater than left. Electronically Signed   By: Camellia Candle M.D.   On: 09/02/2024 11:40     Procedures   Medications Ordered in the ED  0.9 %  sodium chloride  infusion (has no administration in time range)  HYDROmorphone  (DILAUDID ) injection 1 mg (has no administration in time range)  ondansetron  (ZOFRAN -ODT) disintegrating tablet 4 mg (4 mg Oral Given 09/02/24 0715)  sodium chloride  0.9 % bolus 1,000 mL (0 mLs Intravenous Stopped 09/02/24 0950)  fentaNYL  (SUBLIMAZE )  injection 50 mcg (50 mcg Intravenous Given 09/02/24 0816)  HYDROmorphone  (DILAUDID ) injection 1 mg (1 mg Intravenous Given 09/02/24 0854)  iohexol  (OMNIPAQUE ) 350 MG/ML injection 75 mL (75 mLs Intravenous Contrast Given 09/02/24 1044)                                    Medical Decision Making Amount and/or Complexity of Data Reviewed Labs: ordered. Radiology: ordered.  Risk Prescription drug management. Decision regarding hospitalization.   Aaron Burke is here with abdominal pain.  Normal vitals.  No fever.  History of pancreatitis diabetes.  Differential diagnosis could be gastroparesis versus pancreatitis versus gastritis versus less likely bowel obstruction.  Will give IV fluids IV pain medicine IV nausea meds check CBC CMP lipase urinalysis CT scan abdomen and pelvis and reevaluate.  CT scan does show acute pancreatitis with enlarging pseudocyst.  White count is 15.  Hemodynamics unremarkable otherwise.  Lipase 81.  Patient is not in DKA.  CT scan shows enlarging pseudocyst.  Enlarging inflammation and edema along the proximal colon as well likely secondary to pancreatic inflammation.  I talked with the general surgery team Michael Maczis who states nothing acutely surgical at this time.  Will be available if needed during the hospital stay.  They recommend talking with gastroenterology about any further workup with them.  I have messaged the [place] [facility] team.  Will admit to family medicine team for further supportive care.  This chart was dictated using voice recognition software.  Despite best efforts to proofread,  errors can occur which can change the documentation meaning.      Final diagnoses:  Acute pancreatitis, unspecified complication status, unspecified pancreatitis type    ED Discharge Orders     None          Ruthe Cornet, DO 09/02/24 1204

## 2024-09-02 NOTE — Assessment & Plan Note (Addendum)
 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 CT scan shows pancreatitis with enlarging pseudocyst. White count 15 with Lipase 80.  - Admit to [facility], attending Dr. Anders - Consults: GI, Gen Surg - F/u labs: Lipid panel, Blood Gas - Fluids: s/p 1 L bolus, remainder Per Endotool - Pain control: Oxycodone 5-10mg  q4 PRN for moderate-severe pain, Dilaudid  0.5mg  q2h for breakthrough pain - Zofran  ODT 4 mg for N&V, QTC reviewed and appropriate  - Clear liquid diet, advance as tolerated and following GI and Surg recs - AM Labs: BMP, CBC, Lipid Panel

## 2024-09-02 NOTE — Consult Note (Addendum)
 So to that                                               Consultation Note   Referring Provider:   Teaching Service PCP: Delayne Artist PARAS, MD Primary Gastroenterologist:  ? [facility]       Reason for Consultation: Pancreatitis DOA: 09/02/2024         Hospital Day: 1   ATTENDING ADDENDUM 38 year old male with a remote history of gallstone pancreatitis in 2020 status post cholecystectomy.  He has a history of alcohol use and has had episodes of pancreatitis in recent years as well.  Admitted in late October with suspected alcoholic pancreatitis, had a history of pseudocysts in the past.  Was diagnosed with new onset type 2 diabetes at that time, went through alcohol detox.  He represented to the ED on November 1 with abdominal pain, CT scan showed ongoing inflammatory changes in the pancreas with peripancreatic fluid collections, he was discharged home not admitted at that time.  Now re-presenting with worsening abdominal pain that is radiating into his back.  Tolerating liquids.  He states he has been abstinent from alcohol since the end of October.  His liver enzymes show an elevated alk phos of 261, T. bili of 1.7, with mild AST elevation.  Back in the end of October he had bilirubinemia in the fours.  Question if he had alcoholic hepatitis at that time.  Repeat imaging today shows continued peripancreatic inflammation, some progression of complex fluid collections, largest 5 cm in size. Biliary tree appears patent. These fluid collections are evolving since his course from the end of October.  We discussed pancreatitis in general, potential etiologies and I suspect this is related to his alcohol use weeks ago and taking some time to resolve.  Again question component of alcoholic hepatitis at that time in October.  His liver enzymes are fortunately improving, however will get an MRCP to clear his bile ducts given the bump in liver enzymes and to reassess his pancreas, ensure no retained stone  given his history of cholecystectomy for stones in the past.  He is agreeable to this.  Otherwise clear liquid diet at this time, IV fluids and pain control.  May be reasonable to start pancreatic enzymes supplementation. Suspect he will have a prolonged hospital course as he recovers from this, will need time to recover and complete alcohol abstinence.  We do not have imaging in recent years for comparison, unclear how long these fluid collections have been present but they appear to be evolving and I suspect again related to his admission at the end of October.  As such likely not amenable to endoscopic drainage at this time.  If his pain persists and cannot progress, consider IR drainage/aspiration of the larger collections.  We will follow, call with questions  > 75 minutes spent reviewing chart, time with patient, medical decision making, and time documenting.  Marcey Naval, MD [facility]            ASSESSMENT    38 year old male with history of alcohol abuse / gallstone pancreatitis in 2020 complicated by pseudocysts . Admitted to [facility] in late October 2025 with pancreatitis with associated phlegmon, likely EtOH related.  Now readmitted with ongoing abdominal pain and CT scan showing interval progression of ill-defined complex fluid collections compatible with evolving pseudocysts Pancreatitis  likely Etoh related but need to rule out CBD stone given elevated LFTs. Triglycerides normal , serum Ca+ not elevated.   ? Pyelonephritis.  CT scan showing areas of ill-defined hypoperfusion in the inferior right kidney.  Imaging features may be secondary to the adjacent inflammatory changes although pyelonephritis could have this appearance.  ? Anemia Drop in hgb likely related to IV fluid resuscitation   Alcohol abuse Etoh level 360 on 10/23, undetectable on 11/1 and 11/10  Chronic GERD History of dysphagia status post esophageal dilations ( last one reportedly in  2021)  No current symptoms on daily PPI  Depression PTSD History of suicide attempt   See PMH for any additional medical history  / medical problems  Principal Problem:   Pancreatitis Active Problems:   Alcohol use disorder   Increased anion gap   Chronic health problem   Pseudocyst of pancreas   Acute pancreatitis   PLAN:   --LFTs most likely secondary to EtOH and they are improving however will obtain MR/MRCP just to clear bile ducts as source for pancreatitis --Continue daily pantoprazole  for history of GERD -- Supportive care -- Needs total abstinence from EtOH going forward -- No plans for pseudocyst drainage at this point    HPI   Brief history  Patient is a 38 year old male with history gallstone pancreatitis in 2020 in [place].  He subsequently had a cholecystectomy.  He relocated to [name] at some point.  He had follow-up imaging with [facility] in January 2022 showing a small pseudocyst in the pancreatic head as well as a shrunken appearance of the left lobe of the liver.  According to the patient he had no problems with his pancreas or any follow-up imaging since that time until just recently.    Interval history: In late Oct 2023 patient was admitted to the hospital with alcoholic pancreatitis.  Alcohol level 362 . Triglycerides were elevated but only to 276 .  Pancreatitis felt to be secondary to EtOH use.  Lipase was 276 ,  CT scan showed inflammatory changes consistent with acute pancreatitis as well as developing fluid collections suspicious for pseudocyst. .  He has chronically elevated transaminases but there was a new elevation in alkaline phosphatase and total bilirubin, possibly secondary to alcohol. During that admission he was also diagnosed with type 2 diabetes .  Also during that admission there was some reports of melena but his H&H remained stable.  He was detoxed from alcohol with phenobarbital which was continued upon discharge on 08/20/2024.    08/24/2024-patient returned to the ED with complaints of back pain and hyperglycemia.  His alcohol level was less than 15. Lipase was 29, bilirubin had improved as had his liver enzymes though alkaline phosphatase was up higher than previous.  He was discharged home.   Today -patient returned to the ED with worsening abdominal pain after eating spicy foods last night.  He complains of epigastric pain with some radiation through to his back.  He has some nausea without vomiting.  Tolerating liquids.  In the ED his alcohol level was less than 15.  Bilirubin 1.7 which is the same as what it was when he was in the ED on 08/24/2024 but otherwise liver chemistries have improved though not normalized.  WBC 15.3.  Hemoglobin 12    Labs and Imaging:  Recent Labs    09/02/24 0713  PROT 6.3*  ALBUMIN 2.4*  AST 82*  ALT 31  ALKPHOS 261*  BILITOT 1.7*   Recent Labs  09/02/24 0713 09/02/24 1022 09/02/24 1445  WBC 15.3*  --   --   HGB 12.0* 10.9* 11.2*  HCT 36.7* 32.0* 33.0*  MCV 98.9  --   --   PLT 558*  --   --    Recent Labs    09/02/24 0713 09/02/24 1022 09/02/24 1445  NA 135 133* 134*  K 4.2 5.8* 3.7  CL 100 103  --   CO2 19*  --   --   GLUCOSE 204* 183*  --   BUN 5* <3*  --   CREATININE 0.67 0.40*  --   CALCIUM 8.5*  --   --      CT ABDOMEN PELVIS W CONTRAST CLINICAL DATA:  Abdominal pain.  EXAM: CT ABDOMEN AND PELVIS WITH CONTRAST  TECHNIQUE: Multidetector CT imaging of the abdomen and pelvis was performed using the standard protocol following bolus administration of intravenous contrast.  RADIATION DOSE REDUCTION: This exam was performed according to the departmental dose-optimization program which includes automated exposure control, adjustment of the mA and/or kV according to patient size and/or use of iterative reconstruction technique.  CONTRAST:  75mL OMNIPAQUE  IOHEXOL  350 MG/ML SOLN  COMPARISON:  08/24/2024  FINDINGS: Lower chest: Asymmetric  elevation right hemidiaphragm with basilar atelectasis, right greater than left.  Hepatobiliary: No suspicious focal abnormality within the liver parenchyma. Heterogeneity in the anterior hepatic dome is probably related to geographic fatty deposition. Gallbladder is surgically absent. No intrahepatic or extrahepatic biliary dilation.  Pancreas: Peripancreatic edema/inflammation evident most prominent along the head and body of pancreas. These extraperitoneal inflammatory changes track inferiorly towards the pelvis. Pancreatic parenchyma enhances throughout. Interval progression of ill-defined complex fluid collections in the right retroperitoneal space. Collection posterior to the ascending colon measures 5.1 x 2.8 cm today compared to 3.8 x 2.1 cm when remeasured in a similar fashion on the prior study. Collection between the duodenum and right kidney on 50/3 measures 4.4 x 2.8 cm today compared to 4.0 x 1.8 cm previously. Additional ill-defined collections are seen anterior to the right psoas muscle tracking down towards the pelvis, similar to prior.  Spleen: No splenomegaly. No suspicious focal mass lesion.  Adrenals/Urinary Tract: No adrenal nodule or mass. Areas of ill-defined hypoperfusion in the inferior right kidney (52/3 and 55/3) are new in the interval. Left kidney unremarkable. No evidence for hydroureter. Mild bladder wall thickening noted anteriorly.  Stomach/Bowel: Stomach is unremarkable. No gastric wall thickening. No evidence of outlet obstruction. Duodenum is normally positioned as is the ligament of Treitz. No small bowel wall thickening. No small bowel dilatation. The terminal ileum is normal. The appendix is not well visualized, but there is no edema or inflammation in the region of the cecal tip to suggest appendicitis. Edema/inflammation is seen along the course of the proximal sigmoid colon.  Vascular/Lymphatic: Portal vein, superior mesenteric vein,  and splenic vein remain patent. No acute abnormality of the celiac axis or SMA. There is no gastrohepatic or hepatoduodenal ligament lymphadenopathy. No retroperitoneal or mesenteric lymphadenopathy. No pelvic sidewall lymphadenopathy.  Reproductive: The prostate gland and seminal vesicles are unremarkable.  Other: Trace free fluid is seen in the paracolic gutters bilaterally.  Musculoskeletal: No worrisome lytic or sclerotic osseous abnormality.  IMPRESSION: 1. Interval progression of ill-defined complex fluid collections in the right retroperitoneal space. Collection posterior to the ascending colon measures 5.1 x 2.8 cm today compared to 3.8 x 2.1 cm when remeasured in a similar fashion on the prior study. Collection between the duodenum and right kidney  measures 4.4 x 2.8 cm today compared to 4.0 x 1.8 cm previously. These are compatible with evolving pseudocysts. Additional ill-defined collections are seen anterior to the right psoas muscle tracking down towards the pelvis, similar to prior. 2. Areas of ill-defined hypoperfusion in the inferior right kidney are new in the interval. Imaging features may be secondary to the adjacent inflammatory changes although pyelonephritis could have this appearance. 3. Edema/inflammation along the course of the proximal sigmoid colon. This is likely secondary to the pancreatic inflammatory process. 4. Mild bladder wall thickening anteriorly. Correlation with urinalysis suggested. 5. Asymmetric elevation right hemidiaphragm with basilar atelectasis, right greater than left.  Electronically Signed   By: Camellia Candle M.D.   On: 09/02/2024 11:40     Past Medical History:  Diagnosis Date   Gallstone pancreatitis 2020   GERD (gastroesophageal reflux disease)    Plaque psoriasis    Psoriatic arthritis [facility])    Testicular torsion 2020    Past Surgical History:  Procedure Laterality Date   CHOLECYSTECTOMY      ESOPHAGOGASTRODUODENOSCOPY  2020   with dilatation   KNEE RECONSTRUCTION Left 08/30/2004   LEFT HEART CATH AND CORONARY ANGIOGRAPHY N/A 09/29/2020   Procedure: LEFT HEART CATH AND CORONARY ANGIOGRAPHY;  Surgeon: Jordan, Peter M, MD;  [facility] INVASIVE CV LAB;  Service: Cardiovascular;  Laterality: N/A;    Family History  Problem Relation Age of Onset   Hyperlipidemia Mother    Heart attack Father        x 2, total 4 stents   Bipolar disorder Paternal Uncle    Bipolar disorder Maternal Grandmother    Heart attack Paternal Grandfather    Bipolar disorder Other     Prior to Admission medications   Medication Sig Start Date End Date Taking? Authorizing Provider  acetaminophen  (TYLENOL ) 500 MG tablet Take 2 tablets (1,000 mg total) by mouth every 6 (six) hours as needed (pain). 08/20/24  Yes Alena Morrison, Reagan, MD  folic acid (FOLVITE) 1 MG tablet Take 1 tablet (1 mg total) by mouth daily. 08/21/24  Yes Alena Morrison, Reagan, MD  insulin glargine (LANTUS) 100 UNIT/ML injection Inject 0.05 mLs (5 Units total) into the skin daily. Patient taking differently: Inject 10 Units into the skin daily. 08/25/24  Yes Beverley Leita LABOR, PA-C  oxyCODONE (OXY IR/ROXICODONE) 5 MG immediate release tablet Take 1 tablet (5 mg total) by mouth every 6 (six) hours as needed for severe pain (pain score 7-10). 08/25/24  Yes Beverley Leita LABOR, PA-C  pantoprazole  (PROTONIX ) 40 MG tablet Take 1 tablet (40 mg total) by mouth daily. 08/21/24  Yes Alena Morrison, Reagan, MD  PHENobarbital (LUMINAL) 32.4 MG tablet On 10/28, take one tab at 10 pm On 10/29, take one tab every 8 hours (3 times in the day) On 10/30 and 10/31, take one tab every 12 hours (2 times in the day) On 11/1 and 11/2, take one tab only (once in the day) Patient taking differently: Take 32.4 mg by mouth See admin instructions. On 10/28, take one tab at 10 pm On 10/29, take one tab every 8 hours (3 times in the day) On 10/30 and 10/31,  take one tab every 12 hours (2 times in the day) On 11/1 and 11/2, take one tab only (once in the day) 08/20/24  Yes Alena Morrison, Reagan, MD  thiamine  (VITAMIN B1) 100 MG tablet Take 1 tablet (100 mg total) by mouth daily. 08/21/24  Yes Alena Morrison, Reagan, MD  DULoxetine  (CYMBALTA ) 30  MG capsule Take 1 capsule (30 mg total) by mouth daily. Patient not taking: Reported on 09/02/2024 08/20/24   Alena Morrison, Reagan, MD  Multiple Vitamin (MULTIVITAMIN WITH MINERALS) TABS tablet Take 1 tablet by mouth daily. Patient not taking: Reported on 09/02/2024 08/21/24   Alena Morrison, Elio, MD    Current Facility-Administered Medications  Medication Dose Route Frequency Provider Last Rate Last Admin   dextrose 5 % in lactated ringers  infusion   Intravenous Continuous Cleotilde Perkins, DO 125 mL/hr at 09/02/24 1442 New Bag at 09/02/24 1442   dextrose 50 % solution 0-50 mL  0-50 mL Intravenous PRN Cleotilde Perkins, DO       folic acid (FOLVITE) tablet 1 mg  1 mg Oral Daily Cleotilde Perkins, DO   1 mg at 09/02/24 1356   HYDROmorphone  (DILAUDID ) injection 0.5 mg  0.5 mg Intravenous Q2H PRN Cleotilde Perkins, DO       insulin regular, human (MYXREDLIN) 100 units/ 100 mL infusion   Intravenous Continuous Cleotilde Perkins, DO 0.5 mL/hr at 09/02/24 1624 0.5 Units/hr at 09/02/24 1624   lactated ringers  infusion   Intravenous Continuous Cleotilde Perkins, DO 125 mL/hr at 09/02/24 1614 New Bag at 09/02/24 1614   multivitamin with minerals tablet 1 tablet  1 tablet Oral Daily Cleotilde Perkins, DO   1 tablet at 09/02/24 1356   ondansetron  (ZOFRAN -ODT) disintegrating tablet 4 mg  4 mg Oral Q8H PRN Cleotilde Perkins, DO   4 mg at 09/02/24 1604   oxyCODONE (Oxy IR/ROXICODONE) immediate release tablet 5-10 mg  5-10 mg Oral Q4H PRN Cleotilde Perkins, DO   5 mg at 09/02/24 1604   potassium chloride 10 mEq in 100 mL IVPB  10 mEq Intravenous Q1 Hr x 4 Cleotilde Perkins, DO       thiamine  (VITAMIN B1) tablet 100 mg  100 mg Oral Daily  Cleotilde Perkins, DO   100 mg at 09/02/24 1356   Or   thiamine  (VITAMIN B1) injection 100 mg  100 mg Intravenous Daily Cleotilde Perkins, DO       Current Outpatient Medications  Medication Sig Dispense Refill   acetaminophen  (TYLENOL ) 500 MG tablet Take 2 tablets (1,000 mg total) by mouth every 6 (six) hours as needed (pain).     folic acid (FOLVITE) 1 MG tablet Take 1 tablet (1 mg total) by mouth daily. 30 tablet 0   insulin glargine (LANTUS) 100 UNIT/ML injection Inject 0.05 mLs (5 Units total) into the skin daily. (Patient taking differently: Inject 10 Units into the skin daily.) 10 mL 0   oxyCODONE (OXY IR/ROXICODONE) 5 MG immediate release tablet Take 1 tablet (5 mg total) by mouth every 6 (six) hours as needed for severe pain (pain score 7-10). 12 tablet 0   pantoprazole  (PROTONIX ) 40 MG tablet Take 1 tablet (40 mg total) by mouth daily. 30 tablet 0   PHENobarbital (LUMINAL) 32.4 MG tablet On 10/28, take one tab at 10 pm On 10/29, take one tab every 8 hours (3 times in the day) On 10/30 and 10/31, take one tab every 12 hours (2 times in the day) On 11/1 and 11/2, take one tab only (once in the day) (Patient taking differently: Take 32.4 mg by mouth See admin instructions. On 10/28, take one tab at 10 pm On 10/29, take one tab every 8 hours (3 times in the day) On 10/30 and 10/31, take one tab every 12 hours (2 times in the day) On 11/1 and 11/2, take one tab only (once in  the day)) 10 tablet 0   thiamine  (VITAMIN B1) 100 MG tablet Take 1 tablet (100 mg total) by mouth daily. 30 tablet 0   DULoxetine  (CYMBALTA ) 30 MG capsule Take 1 capsule (30 mg total) by mouth daily. (Patient not taking: Reported on 09/02/2024) 30 capsule 0   Multiple Vitamin (MULTIVITAMIN WITH MINERALS) TABS tablet Take 1 tablet by mouth daily. 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  Enjoys golf, hunting, surfing, outdoor sports.    Social Drivers of Corporate Investment Banker Strain: Not on file  Food Insecurity: No Food Insecurity (09/02/2024)   Hunger Vital Sign    Worried About Running Out of Food in the Last Year: Never true    Ran Out of Food in the Last Year: Never true  Transportation Needs: No Transportation Needs (09/02/2024)   PRAPARE - Administrator, Civil Service (Medical): No    Lack of Transportation (Non-Medical): No  Physical Activity: Not on file  Stress: Not on file  Social Connections: Unknown (11/30/2022)   Received from [facility]   Social Network    Social Network: Not on file  Intimate Partner Violence: Not At Risk (09/02/2024)   Humiliation, Afraid, Rape, and Kick questionnaire    Fear of Current or Ex-Partner: No    Emotionally Abused: No    Physically Abused: No    Sexually Abused: No     Code Status   Code Status: Full Code  Review of Systems: All systems reviewed and negative except where noted in HPI.  Physical Exam: Vital signs in last 24 hours: Temp:  [98.4 F (36.9 C)-99.4 F (37.4 C)] 99.4 F (37.4 C) (11/10 1056) Pulse  Rate:  [104-133] 112 (11/10 1230) Resp:  [14-36] 36 (11/10 1230) BP: (104-129)/(70-87) 116/78 (11/10 1230) SpO2:  [95 %-100 %] 95 % (11/10 1230) Weight:  [68 kg] 68 kg (11/10 0711)    General:  Pleasant male in NAD Psych:  Cooperative. Normal mood and affect Eyes: Pupils equal Ears:  Normal auditory acuity Nose: No deformity, discharge or lesions Neck:  Supple, no masses felt Lungs:  Clear to auscultation.  Heart:  Regular rate, regular rhythm.  Abdomen:  Soft, nondistended, generalized abdominal tenderness. Active bowel sounds, no masses felt Rectal :  Deferred Msk: Symmetrical without gross deformities.  Neurologic:  Alert, oriented, grossly normal neurologically Extremities : No edema Skin:  Intact without significant lesions.    Intake/Output from previous day: No intake/output data recorded. Intake/Output  this shift:  Total I/O In: 1000 [IV Piggyback:1000] Out: -    Vina Dasen, NP-C   09/02/2024, 4:46 PM

## 2024-09-02 NOTE — Assessment & Plan Note (Deleted)
-   Admit to FMTS, attending *** - ***, Vital signs per floor - Consults: *** - Fluids: *** - Antibiotics: *** - Pain control: *** - Bowel Reg: *** - AM Labs: *** - Fall precautions*** - Delirium precautions*** - PT/OT consult***

## 2024-09-02 NOTE — Assessment & Plan Note (Addendum)
 GERD: Protonix  40 mg daily DMT2: on Lantus 5 units daily, last A1c 8.0, endotool as above

## 2024-09-02 NOTE — H&P (Addendum)
 Hospital Admission History and Physical Service Pager: [phone]  Patient name: Aaron Burke Medical record number: [id number] Date of Birth: 08/16/1986 Age: 38 y.o. Gender: male  Primary Care Provider: Delayne Artist PARAS, MD Consultants: GI, GS Code Status: Full code which was confirmed with family if patient unable to confirm   Preferred Emergency Contact:  Contact Information     Name Relation Home Work Mobile   Fariss,Cheryl Mother   [phone]      Other Contacts   None on File      Chief Complaint: Abdominal pain  Assessment and Plan: Aaron Burke is a 38 y.o. male presenting with abdominal pain with a CT scan showing pancreatitis with enlarging pseudocyst compared to prior. General Surg consulted in ED, recommended non-operative management. GI consulted and will see this admission.  Assessment & Plan Pancreatitis Pseudocyst of pancreas History of gallstone pancreatitis in 2020, pancreatitis with pseudocyst 07/2024. CT scan shows pancreatitis with enlarging pseudocyst. White count 15 with Lipase 80.  - Admit to FMTS, attending Dr. Anders - Consults: GI, Gen Surg - F/u labs: Lipid panel, Blood Gas - Fluids: s/p 1 L bolus, remainder Per Endotool - Pain control: Oxycodone 5-10mg  q4 PRN for moderate-severe pain, Dilaudid  0.5mg  q2h for breakthrough pain - Zofran  ODT 4 mg for N&V, QTC reviewed and appropriate  - Clear liquid diet, advance as tolerated and following GI and Surg recs - AM Labs: BMP, CBC, Lipid Panel Increased anion gap S/p 1L bolus in ED, differentials include DKA 2/2 alcohol abuse vs starvation ketoacidosis. Last A1c 8.0 - Endo tool  - Labs: VBG, BMP q4h - AM Mag, Phos - plan to continue endotool until anion gap <12 x2  Alcohol use disorder History of withdrawal seizures. Last drink 3 weeks ago per patient - Labs: Ethanol - CIWA q6h  - Consider Ativan  PRN if elevated CIWA - 100 mg thiamine  now IV now with daily Thiamine  100mg   - finished  phenobarbital taper outpatient, prescribed last admission  - check ethanol level today  Chronic health problem GERD: Protonix  40 mg daily DMT2: on Lantus 5 units daily, last A1c 8.0, endotool as above   FEN/GI: CLD VTE Prophylaxis: SCDs  Disposition: Progressive   History of Present Illness:  Aaron Burke is a 38 y.o. male with a PMHx of PTSD, MDD, GERD, GAD, HLD presenting with severe abdominal pain.   Ate spicy food last night and then woke up with severe abdominal pain that did not improve with tylenol  or ibuprofen. Took 3 imodium  yesterday for diarrhea which is worse after cholecystectomy in 2020. Vomited 2-3 times yesterday. Denies any history of fever or weakness. Last alcoholic beverage over 3 weeks ago. Drink of choice vodka and IPA.   Recent ED visit for generalized abdominal pain. CT repeated due to concern for pancreatitis and pseudocyst, stable without significant or concerning findings. Labs are generally improved compared to prior admission. Pain and vomiting improved with antiemetics and pain medications. He is provided with 2 L of IV fluids and finally able to provide a urine sample. Plan is to discharge with pain medication.   In the ED, vital signs significant for tachycardia. S/p 1L bolus. Gave fentanyl  IV 50mcg once with 2 injections Dilaudid  1mg  for pain. CT confirmed pancreatitis with enlarging pseudocyst. Gen surg consulted and did not recommend surgery at this time. GI consulted for recs.   Review Of Systems: Per HPI with the following additions: as above   Pertinent Past Medical History: PTSD Alcohol use  disorder Suicide attempt Remainder reviewed in history tab.   Pertinent Past Surgical History: Cholecystectomy  Remainder reviewed in history tab.  Pertinent Social History: Tobacco use: Yes, vape Alcohol use: Last reported drink 3 weeks ago, peviously 6-12 drinks of beer and vodka dailty Other Substance use: Denies  Pertinent Family  History: Mother-hyperlipidemia Father-heart attack Paternal grandfather-heart attack Maternal grandmother-bipolar disorder Paternal uncle-bipolar disorder  Remainder reviewed in history tab.   Important Outpatient Medications: Protonix  40 mg  Duloxetine  30 mg  Oxycodone 5 mg  Lantus 5 units daily  Thiamine  100 mg tablets Remainder reviewed in medication history.   Objective: BP 129/87   Pulse (!) 104   Temp 99.4 F (37.4 C) (Oral)   Resp 18   Ht 5' 9 (1.753 m)   Wt 68 kg   SpO2 100%   BMI 22.14 kg/m   Physical Exam: General: Well-appearing, no acute distress Cardio: Regular rate, regular rhythm, no murmurs on exam. Pulm: Clear, no wheezing, no crackles. No increased work of breathing Abdominal: bowel sounds present, soft, abdominal tenderness in epigastric region, no abdominal distension  Extremities: no peripheral edema  Neuro: alert and oriented x3, speech normal in content, no facial asymmetry   Labs:  CBC BMET  Recent Labs  Lab 09/02/24 0713 09/02/24 1022  WBC 15.3*  --   HGB 12.0* 10.9*  HCT 36.7* 32.0*  PLT 558*  --    Recent Labs  Lab 09/02/24 0713 09/02/24 1022  NA 135 133*  K 4.2 5.8*  CL 100 103  CO2 19*  --   BUN 5* <3*  CREATININE 0.67 0.40*  GLUCOSE PENDING 183*  CALCIUM 8.5*  --     Lipase: 80   EKG: Sinus Tachycardia   Imaging Studies Performed:  CT abdomen pelvis with contrast: Acute pancreatitis with enlarging pseudocyst.  Enlarging formation edema along the proximal colon likely secondary to pancreatic inflammation.   Elodie Palma, MD 09/02/2024, 12:27 PM PGY-1, [facility]  FPTS Intern pager: [phone], text pages welcome Secure chat group [facility] Teaching Service   I have reviewed the above note, agree with its content, and have made the appropriate changes.   Damien Pinal, DO [facility], PGY-3

## 2024-09-02 NOTE — Plan of Care (Signed)
 FMTS Brief Progress Note  S: Went to evaluate patient at bedside with Dr. Coralee.  Patient reports ongoing pain and trouble eating.  O: BP 117/71   Pulse (!) 110   Temp 99.1 F (37.3 C) (Oral)   Resp 17   Ht 5' 9 (1.753 m)   Wt 68 kg   SpO2 99%   BMI 22.14 kg/m    General: NAD, ill-appearing Cardio: RRR, no murmurs Respiratory: Normal work of breathing on room air Abdomen: TTP epigastric region  A/P: Metabolic acidosis Pancreatitis - Reviewed recent lab work, plan to transition off Endo tool.  Will not start long-acting insulin at this time, will instead use sliding scale. - Continue clear liquid diet - Switched Zofran  to IV - Rest of plan as per H&P from day team - Orders reviewed. Labs for AM ordered, which was adjusted as needed.  - If condition changes, plan includes page family medicine teaching service.   Howell Lunger, DO 09/02/2024, 9:17 PM PGY-3, [facility] Night Resident  Please page [phone] with questions.

## 2024-09-02 NOTE — Assessment & Plan Note (Addendum)
 History of withdrawal seizures. Last drink 3 weeks ago per patient - Labs: Ethanol - CIWA q6h  - Consider Ativan  PRN if elevated CIWA - 100 mg thiamine  now IV now with daily Thiamine  100mg   - finished phenobarbital taper outpatient, prescribed last admission  - check ethanol level today

## 2024-09-02 NOTE — ED Notes (Signed)
 Called lab to check on status of CMP and lipase, lab states they are down a machine right now so labs are delayed.

## 2024-09-02 NOTE — ED Triage Notes (Signed)
 Pt states he ate spicy food last night and  woke up with stabbing abdominal pain. Feels like pancreatitis per pt. Pt has had nausea, denies vomiting, diarrhea or constipation.

## 2024-09-02 NOTE — ED Notes (Signed)
Provided patient with urine specimen cup.

## 2024-09-02 NOTE — Assessment & Plan Note (Addendum)
 S/p 1L bolus in ED, differentials include DKA 2/2 alcohol abuse vs starvation ketoacidosis. Last A1c 8.0 - Endo tool  - Labs: VBG, BMP q4h - AM Mag, Phos - plan to continue endotool until anion gap <12 x2

## 2024-09-02 NOTE — ED Notes (Signed)
 Endotool stated to start at 0 units of insulin. D5 being started and will recheck CBG in 15 minutes

## 2024-09-03 ENCOUNTER — Inpatient Hospital Stay (HOSPITAL_COMMUNITY): Payer: MEDICAID

## 2024-09-03 DIAGNOSIS — D649 Anemia, unspecified: Secondary | ICD-10-CM

## 2024-09-03 LAB — CBC
HCT: 23.2 % — ABNORMAL LOW (ref 39.0–52.0)
HCT: 30.5 % — ABNORMAL LOW (ref 39.0–52.0)
Hemoglobin: 7.5 g/dL — ABNORMAL LOW (ref 13.0–17.0)
Hemoglobin: 10.3 g/dL — ABNORMAL LOW (ref 13.0–17.0)
MCH: 32.3 pg (ref 26.0–34.0)
MCH: 32.5 pg (ref 26.0–34.0)
MCHC: 32.3 g/dL (ref 30.0–36.0)
MCHC: 33.8 g/dL (ref 30.0–36.0)
MCV: 100 fL (ref 80.0–100.0)
MCV: 96.2 fL (ref 80.0–100.0)
Platelets: 416 K/uL — ABNORMAL HIGH (ref 150–400)
Platelets: 350 K/uL (ref 150–400)
RBC: 2.32 MIL/uL — ABNORMAL LOW (ref 4.22–5.81)
RBC: 3.17 MIL/uL — ABNORMAL LOW (ref 4.22–5.81)
RDW: 13 % (ref 11.5–15.5)
RDW: 12.7 % (ref 11.5–15.5)
WBC: 17 K/uL — ABNORMAL HIGH (ref 4.0–10.5)
WBC: 16.7 K/uL — ABNORMAL HIGH (ref 4.0–10.5)
nRBC: 0 % (ref 0.0–0.2)
nRBC: 0 % (ref 0.0–0.2)

## 2024-09-03 LAB — COMPREHENSIVE METABOLIC PANEL WITH GFR
ALT: 22 U/L (ref 0–44)
AST: 56 U/L — ABNORMAL HIGH (ref 15–41)
Albumin: 1.9 g/dL — ABNORMAL LOW (ref 3.5–5.0)
Alkaline Phosphatase: 199 U/L — ABNORMAL HIGH (ref 38–126)
Anion gap: 12 (ref 5–15)
BUN: <5 mg/dL — ABNORMAL LOW (ref 6–20)
CO2: 20 mmol/L — ABNORMAL LOW (ref 22–32)
Calcium: 7.7 mg/dL — ABNORMAL LOW (ref 8.9–10.3)
Chloride: 98 mmol/L (ref 98–111)
Creatinine, Ser: 0.58 mg/dL — ABNORMAL LOW (ref 0.61–1.24)
GFR, Estimated: >60 mL/min (ref >60)
Glucose, Bld: 150 mg/dL — ABNORMAL HIGH (ref 70–99)
Potassium: 4.4 mmol/L (ref 3.5–5.1)
Sodium: 130 mmol/L — ABNORMAL LOW (ref 135–145)
Total Bilirubin: 1.8 mg/dL — ABNORMAL HIGH (ref 0.0–1.2)
Total Protein: 4.9 g/dL — ABNORMAL LOW (ref 6.5–8.1)

## 2024-09-03 LAB — MAGNESIUM: Magnesium: 1.3 mg/dL — ABNORMAL LOW (ref 1.7–2.4)

## 2024-09-03 LAB — LIPID PANEL
Cholesterol: 131 mg/dL (ref 0–200)
HDL: 25 mg/dL — ABNORMAL LOW (ref >40)
LDL Cholesterol: 91 mg/dL (ref 0–99)
Total CHOL/HDL Ratio: 5.2 ratio
Triglycerides: 77 mg/dL (ref <150)
VLDL: 15 mg/dL (ref 0–40)

## 2024-09-03 LAB — GLUCOSE, CAPILLARY
Glucose-Capillary: 141 mg/dL — ABNORMAL HIGH (ref 70–99)
Glucose-Capillary: 160 mg/dL — ABNORMAL HIGH (ref 70–99)

## 2024-09-03 LAB — PROTIME-INR
INR: 1.2 (ref 0.8–1.2)
Prothrombin Time: 16 s — ABNORMAL HIGH (ref 11.4–15.2)

## 2024-09-03 LAB — PHOSPHORUS: Phosphorus: 3.4 mg/dL (ref 2.5–4.6)

## 2024-09-03 LAB — CBG MONITORING, ED
Glucose-Capillary: 156 mg/dL — ABNORMAL HIGH (ref 70–99)
Glucose-Capillary: 178 mg/dL — ABNORMAL HIGH (ref 70–99)

## 2024-09-03 MED ORDER — HYDROXYZINE HCL 25 MG PO TABS
25.0000 mg | ORAL_TABLET | Freq: Three times a day (TID) | ORAL | Status: DC | PRN
  Administered 2024-09-03 – 2024-09-05 (×5): 25 mg via ORAL
  Filled 2024-09-03 (×6): qty 1

## 2024-09-03 MED ORDER — PIPERACILLIN-TAZOBACTAM 3.375 G IVPB
3.3750 g | Freq: Three times a day (TID) | INTRAVENOUS | Status: DC
  Administered 2024-09-03 – 2024-09-04 (×3): 3.375 g via INTRAVENOUS
  Filled 2024-09-03 (×3): qty 50

## 2024-09-03 MED ORDER — MELATONIN 3 MG PO TABS
3.0000 mg | ORAL_TABLET | Freq: Every day | ORAL | Status: DC
  Administered 2024-09-03 – 2024-09-04 (×2): 3 mg via ORAL
  Filled 2024-09-03 (×2): qty 1

## 2024-09-03 MED ORDER — ORAL CARE MOUTH RINSE: 15.0000 mL | OROMUCOSAL | Status: DC | PRN

## 2024-09-03 MED ORDER — GADOBUTROL 1 MMOL/ML IV SOLN
7.0000 mL | Freq: Once | INTRAVENOUS | Status: AC | PRN
  Administered 2024-09-03: 7 mL via INTRAVENOUS

## 2024-09-03 MED ORDER — MAGNESIUM SULFATE 4 GM/100ML IV SOLN
4.0000 g | Freq: Once | INTRAVENOUS | Status: AC
  Administered 2024-09-03: 4 g via INTRAVENOUS
  Filled 2024-09-03: qty 100

## 2024-09-03 NOTE — Assessment & Plan Note (Signed)
 Transitioned off Endotool 11/10 given stable lab work and questionable DKA; now on SSI. - Continue sSSI with CBGs TID with meals and QHS

## 2024-09-03 NOTE — ED Notes (Signed)
 Breathing is even and unlabored. PT resting on right side at this time.

## 2024-09-03 NOTE — Progress Notes (Signed)
 Daily Progress Note  DOA: 09/02/2024 Hospital Day: 2  Cc: Recurrent pancreatitis with pseudocyst  ASSESSMENT    38 year old male with admitted with abdominal pain  / recurrent acute pancreatitis with enlarging multiple pseudocysts, leukocytosis and now fever concerning for infected pancreatic fluid collection    TODAY: Fever 102.7 today. No dysuria or cough.  WBC 16.7.  HCT 30%, renal function normal.  Mildly tachycardic , normotensive .  MRCP -->-no definite evidence for choledocholithiasis but that there is seem to be some mass effect on the common bile duct which is not evaluated well due to motion artifact .  There are multiple fluid collections  and a multiloculated retroperitoneal collection anterior to the right kidney measuring 10.9 x 2.7 cm     Elevated LFTs Bilirubin was also elevated in October when he was hospitalized with pancreatitis as well as newly diagnosed diabetes.  Possibly had alcoholic hepatitis at that time.  Last alcohol intake was at least 2 weeks ago though still could have alcoholic hepatitis. SABRA  MRCP without definite evidence for choledocholithiasis .  MDF only 5.5 ( with PT control of 15.2) TODAY: Liver chemistries improving with exception of T. bili up from 1.7 to 1.8 overnight.   [place] Anemia Hemoglobin fluctuating in the setting of IV fluids.  Hemoglobin decline last night from 11.2 to 7.7 however on recheck this afternoon it was up to 10.3 so suspect the 7.7 was spurious  Abnormal duodenum on MRI There is some hyperenhancement in the wall of the duodenum in the region of the ampulla with some mural irregularity. [[Possibly secondary to peripancreatic inflammatory processes   Principal Problem:   Pancreatitis Active Problems:   Alcohol use disorder   Chronic health problem   Pancreatic pseudocyst   Acute pancreatitis   Elevated liver enzymes   Acute anemia   PLAN   --  A.m. CBC -- Will consult IR for possible aspiration of pseudocyst to  rule out infectious  --Continue clear liquids -- Continue supportive care  Subjective    Abdominal pain possibly slightly worse today compared to yesterday.  Mild nausea but tolerating clear liquids.  Had a bowel movement today.  Passing a lot of flatus.  In good spirits   Objective     Recent Labs    09/02/24 0713 09/02/24 1022 09/02/24 1445 09/03/24 0210 09/03/24 1347  WBC 15.3*  --   --  17.0* 16.7*  HGB 12.0*   < > 11.2* 7.5* 10.3*  HCT 36.7*   < > 33.0* 23.2* 30.5*  MCV 98.9  --   --  100.0 96.2  PLT 558*  --   --  416* 350   < > = values in this interval not displayed.   No results for input(s): FOLATE, VITAMINB12, FERRITIN, TIBC, IRONPCTSAT in the last 72 hours. Recent Labs    09/02/24 1439 09/02/24 1445 09/02/24 1804 09/03/24 0210  NA 135 134* 133* 130*  K 3.9 3.7 4.0 4.4  CL 101  --  100 98  CO2 20*  --  21* 20*  GLUCOSE 143*  --  158* 150*  BUN <5*  --  <5* <5*  CREATININE 0.58*  --  0.51* 0.58*  CALCIUM 7.7*  --  7.8* 7.7*   Recent Labs    09/02/24 0713 09/03/24 0210  PROT 6.3* 4.9*  ALBUMIN 2.4* 1.9*  AST 82* 56*  ALT 31 22  ALKPHOS 261* 199*  BILITOT 1.7* 1.8*    Scheduled inpatient medications:  folic acid  1 mg Oral Daily   insulin aspart  0-9 Units Subcutaneous TID WC   multivitamin with minerals  1 tablet Oral Daily   ondansetron  (ZOFRAN ) IV  4 mg Intravenous Q6H   thiamine   100 mg Oral Daily   Or   thiamine   100 mg Intravenous Daily   Continuous inpatient infusions:   lactated ringers  125 mL/hr at 09/03/24 0916   piperacillin-tazobactam (ZOSYN)  IV 3.375 g (09/03/24 1358)   promethazine (PHENERGAN) injection (IM or IVPB)     PRN inpatient medications: dextrose, HYDROmorphone  (DILAUDID ) injection, hydrOXYzine , oxyCODONE, promethazine (PHENERGAN) injection (IM or IVPB)  Vital signs in last 24 hours: Temp:  [98.6 F (37 C)-102.7 F (39.3 C)] 99.6 F (37.6 C) (11/11 1338) Pulse Rate:  [106-114] 110 (11/11  1338) Resp:  [15-28] 19 (11/11 1338) BP: (112-135)/(67-101) 124/74 (11/11 1338) SpO2:  [94 %-100 %] 99 % (11/11 1338) Last BM Date : 09/03/24  Intake/Output Summary (Last 24 hours) at 09/03/2024 1507 Last data filed at 09/03/2024 0100 Gross per 24 hour  Intake --  Output 90 ml  Net -90 ml    Intake/Output from previous day: 11/10 0701 - 11/11 0700 In: 1000 [IV Piggyback:1000] Out: 90 [Urine:90] Intake/Output this shift: No intake/output data recorded.   Physical Exam:  General: Alert male in NAD Heart:  Regular rate and rhythm.  Pulmonary: Normal respiratory effort Abdomen: Soft, nondistended, moderate generalized tenderness , normal bowel sounds. Extremities: No lower extremity edema  Neurologic: Alert and oriented Psych: Pleasant. Cooperative     LOS: 1 day   Vina Dasen ,NP 09/03/2024, 3:07 PM

## 2024-09-03 NOTE — Assessment & Plan Note (Addendum)
-   Gen Surg previously consulted: recommend non-operative management - GI consulted, appreciate recs - Fluids: maintenance with LR at 125 mL/hr - Pain control: Oxycodone 5-10mg  q4h PRN for moderate-severe pain, Dilaudid  0.5mg  q2h for breakthrough pain - Zofran  ODT 4 mg q6h prn, phenergan 6.25 mg IV q6h prn for N/V - Clear liquid diet, advance as tolerated and following GI recs - AM Labs: CBC, BMP, Mag - Repleting electrolytes as needed; given 4mg  Mag IV this AM - Strict Is/Os

## 2024-09-03 NOTE — Progress Notes (Signed)
 Daily Progress Note Intern Pager: [phone]  Patient name: Aaron Burke Medical record number: [id number] Date of birth: November 26, 1985 Age: 38 y.o. Gender: male  Primary Care Provider: Delayne Artist PARAS, MD Consultants: GI, Gen Surg Code Status: Full  Pt Overview and Major Events to Date:  - 11/10: Admitted, GI and Gen Surg consulted - 11/11: MRCP  Medical Decision Making:  Aaron Burke is a 38 y.o. male with PMH of PTSD, depression (hx suicide attempt), Alcohol use disorder, GERD, T2DM. Admitted for severe abdominal pain 2/2 pancreatitis with enlarging pseudocyst. 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 CT scan shows pancreatitis with enlarging pseudocyst.  MCRP done earlier today with findings consistent with acute pancreatitis with multiple pseudocysts. Did also show a retroperitoneal fluid collection. Patient has had a drop in Hgb from 12 on 11/10 to 7.5 today 11/11. Concern for blood collection. Patient remains tachycardic to 110s and tachypneic to 20s; suspect in setting of pain and anemia. Otherwise stable clinically with normal BPs and no reported blood in stool. Reached out to Dr. Leigh in GI about labs and MRCP findings, who recommended repeating CBC now; GI to see patient soon. Can consider Gen Surg consult pending further GI recs. Did calculate Ranson's criteria: score of 2 on admission, severe pancreatitis unlikely. 48hr score of 4, suggesting 15% chance of mortality. No indication for ICU transfer at this time, but will keep patient at progressive level of care and continue to monitor closely. Assessment & Plan Pancreatitis Peripancreatic fluid collection - Gen Surg previously consulted: recommend non-operative management - GI consulted, appreciate recs - Fluids: maintenance with LR at 125 mL/hr - Pain control: Oxycodone 5-10mg  q4h PRN for moderate-severe pain, Dilaudid  0.5mg  q2h for breakthrough pain - Zofran  ODT 4 mg q6h  prn, phenergan 6.25 mg IV q6h prn for N/V - Clear liquid diet, advance as tolerated and following GI recs - AM Labs: CBC, BMP, Mag - Repleting electrolytes as needed; given 4mg  Mag IV this AM - Strict Is/Os Anemia Decrease in Hgb from 12 on 11/10 to 7.5 today 11/11. See above for MDM. - Repeat CBC now per GI recs - Transfusion goal of > 7.5 Alcohol use disorder Elevated liver enzymes History of withdrawal seizures. Last drink 3 weeks ago per patient. Ethanol <15 on admission 11/10. Last CIWA at ~0500 of 4. Liver enzymes mildly elevated but improved (AST 56, ALT 22). Patient reports hearing occasional boom auditory sounds, consider tinnitus rather than true auditory hallucinations; no other symptoms of withdrawal. - CIWA q6h - Consider Ativan  prn if elevated CIWAs - Finished phenobarb taper (prescribed last admission) outpatient  - Hydroxyzine  25 mg TID prn for anxiety - Thiamine  100 mg daily - Folic acid 1 mg daily Increased anion gap (Resolved: 09/03/2024) Transitioned off Endotool 11/10 given stable lab work and questionable DKA; now on SSI. - Continue sSSI with CBGs TID with meals and QHS Chronic health problem GERD: Protonix  40 mg daily DMT2: Holding home Lantus 5 units daily, last A1c 8.0; on sSSI in hospital, checking CBGs TID with meals and QHS  FEN/GI: Clear liquid diet PPx: SCDs Dispo:Home pending clinical improvement . Barriers include continued pain management and need for IV fluids, continued workup with GI.   Subjective:  Patient report his pain is currently well-controlled on current regimen.  He reports he has been drinking well and eating well without pain, nausea, vomiting.  Did have a bowel movement this morning, which he reports was not very formed but  with no pain or blood.  Reports he has been sleeping poorly/not at all. Denies visual hallucinations, tremors, recent seizure activity. Reports auditory hallucinations in form of boom sound.  Objective: Temp:   [98.6 F (37 C)-99.8 F (37.7 C)] 98.6 F (37 C) (11/11 0620) Pulse Rate:  [104-114] 112 (11/11 0645) Resp:  [14-36] 22 (11/11 0645) BP: (104-135)/(70-101) 119/77 (11/11 0645) SpO2:  [94 %-100 %] 95 % (11/11 0645) Physical Exam: General: Patient lying down in bed on side, no acute distress. Cardiovascular: Regular rate and rhythm, no murmurs/rubs/gallops. Respiratory: Normal work of breathing on room air. Clear to auscultation bilaterally; no wheezes, crackles. Abdomen: Bowel sounds present and normoactive bilaterally.  Tenderness to minimal palpation throughout abdomen, worse in lower bilateral quadrants and suprapubically.  No rebound, patient tensing on exam. Extremities: Skin warm, dry. No bilateral lower extremity edema. Neuro: Alert and appropriately responding to questions. Psych: Appropriate affect. Not responding to internal stimuli. No visual hallucinations, reports ?auditory hallucinations? described as boom sound.  Laboratory: Most recent CBC Lab Results  Component Value Date   WBC 17.0 (H) 09/03/2024   HGB 7.5 (L) 09/03/2024   HCT 23.2 (L) 09/03/2024   MCV 100.0 09/03/2024   PLT 416 (H) 09/03/2024   Most recent BMP    Latest Ref Rng & Units 09/03/2024    2:10 AM  BMP  Glucose 70 - 99 mg/dL 849   BUN 6 - 20 mg/dL <5   Creatinine 9.38 - 1.24 mg/dL 9.41   Sodium 864 - 854 mmol/L 130   Potassium 3.5 - 5.1 mmol/L 4.4   Chloride 98 - 111 mmol/L 98   CO2 22 - 32 mmol/L 20   Calcium 8.9 - 10.3 mg/dL 7.7   Phos: 3.4  Mag: 1.3  Lipid Panel     Component Value Date/Time   CHOL 131 09/03/2024 0210   CHOL 319 (H) 10/30/2020 1636   TRIG 77 09/03/2024 0210   HDL 25 (L) 09/03/2024 0210   HDL 87 10/30/2020 1636   CHOLHDL 5.2 09/03/2024 0210   VLDL 15 09/03/2024 0210   LDLCALC 91 09/03/2024 0210   LDLCALC 222 (H) 10/30/2020 1636   LABVLDL 10 10/30/2020 1636     MRCP 11/11: 1. Diffuse pancreatic and peripancreatic inflammatory change with multiple rim enhancing  complex fluid collections in the peripancreatic space and retroperitoneum. Imaging features compatible with acute pancreatitis with multiple pseudocysts. Superinfection of the pseudocysts or abscess not excluded. Close follow-up warranted to exclude the possibility of underlying pancreatic mass lesion. 2. Main pancreatic duct is dilated up to 7 mm in the head of the pancreas and then tapers into the ampullary portion. There is some hyperenhancement in the wall of the duodenum in the region of the ampulla with some mural irregularity. This is presumably secondary to inflammation. Follow-up recommended to ensure resolution. 3. Hepatomegaly. Left hepatic lobe is markedly atrophic. 4. No substantial biliary dilatation. MRCP imaging is motion degraded but no definite choledocholithiasis. 5. Small area of hypo perfusion lower pole right kidney as seen on recent CT. This is immediately adjacent to an inflammatory lesion in the retroperitoneal space and presumably secondary to the adjacent inflammatory process. 6. Dependent atelectasis, right greater than left.  EKG 11/11: My interpretation: Sinus tach.   [name], MD 09/03/2024, 7:27 AM  PGY-1, [facility] FPTS Intern pager: [phone], text pages welcome Secure chat group [facility] Teaching Service

## 2024-09-03 NOTE — Assessment & Plan Note (Addendum)
 GERD: Protonix  40 mg daily DMT2: Holding home Lantus 5 units daily, last A1c 8.0; on sSSI in hospital, checking CBGs TID with meals and QHS

## 2024-09-03 NOTE — ED Notes (Signed)
Patient given cup of apple juice

## 2024-09-03 NOTE — ED Notes (Signed)
 Dr. Madelon notified of temperature of 102.7

## 2024-09-03 NOTE — Progress Notes (Signed)
 [facility]   Bed Availability Yes  Level of Care Needed:  Yes  MD Agree to transfer: No  Patient agree to transfer: No  Sharolyn Batman, RN    PT with [facility]

## 2024-09-03 NOTE — Progress Notes (Signed)
 Pt arrived to unit from ED  VSS, A/O x 4,  CCMD called ,CHG given, pt oriented to unit,Will continue to monitor.   Jon POUR Rich Paprocki, RN    09/03/24 1338  Vitals  Temp 99.6 F (37.6 C)  Temp Source Axillary  BP 124/74  MAP (mmHg) 89  BP Location Right Arm  BP Method Automatic  Patient Position (if appropriate) Lying  Pulse Rate (!) 110  Pulse Rate Source Monitor  ECG Heart Rate (!) 111  Resp 19  Level of Consciousness  Level of Consciousness Alert  Oxygen Therapy  SpO2 99 %  O2 Device Room Air  Pain Assessment  Pain Scale 0-10  Pain Score 4  MEWS Score  MEWS Temp 0  MEWS Systolic 0  MEWS Pulse 2  MEWS RR 0  MEWS LOC 0  MEWS Score 2  MEWS Score Color Yellow

## 2024-09-03 NOTE — ED Notes (Signed)
Phlebotomy contacted for blood cultures

## 2024-09-03 NOTE — Assessment & Plan Note (Addendum)
 Decrease in Hgb from 12 on 11/10 to 7.5 today 11/11. See above for MDM. - Repeat CBC now per GI recs - Transfusion goal of > 7.5

## 2024-09-03 NOTE — ED Notes (Signed)
 Report called to receiving nurse

## 2024-09-03 NOTE — Assessment & Plan Note (Addendum)
 History of withdrawal seizures. Last drink 3 weeks ago per patient. Ethanol <15 on admission 11/10. Last CIWA at ~0500 of 4. Liver enzymes mildly elevated but improved (AST 56, ALT 22). Patient reports hearing occasional boom auditory sounds, consider tinnitus rather than true auditory hallucinations; no other symptoms of withdrawal. - CIWA q6h - Consider Ativan  prn if elevated CIWAs - Finished phenobarb taper (prescribed last admission) outpatient  - Hydroxyzine  25 mg TID prn for anxiety - Thiamine  100 mg daily - Folic acid 1 mg daily

## 2024-09-03 NOTE — Inpatient Diabetes Management (Signed)
 Inpatient Diabetes Program Recommendations  AACE/ADA: New Consensus Statement on Inpatient Glycemic Control   Target Ranges:  Prepandial:   less than 140 mg/dL      Peak postprandial:   less than 180 mg/dL (1-2 hours)      Critically ill patients:  140 - 180 mg/dL   Lab Results  Component Value Date   GLUCAP 178 (H) 09/03/2024   HGBA1C 8.0 (H) 08/16/2024    Latest Reference Range & Units 09/02/24 18:01 09/02/24 19:18 09/02/24 20:34 09/02/24 22:09 09/03/24 09:05 09/03/24 12:55  Glucose-Capillary 70 - 99 mg/dL 857 (H) 838 (H) 880 (H) 121 (H) 156 (H) 178 (H)   Review of Glycemic Control  Diabetes history: DM2  Outpatient Diabetes medications:  Lantus 10 units daily   Current orders for Inpatient glycemic control:  Novolog 0-9 units TID   Inpatient Diabetes Program Recommendations:   - Noted consult for diabetes coordinator.  - IV insulin was stopped at 2145 on 11/10.   Please consider staring Lantus 5 units daily.   Thanks,  Lavanda Search, RN, MSN, [facility]  Inpatient Diabetes Coordinator  Pager [phone] (8a-5p)

## 2024-09-03 NOTE — ED Notes (Signed)
 Pt c/o feeling anxious and request IV benadryl to help him rest, admitting MD notified of pt request and order prn Atarax . MD also made aware pt HR sustained 110-115. Ciwa 4, pt reports last drink 3.5 wks ago.

## 2024-09-03 NOTE — ED Notes (Signed)
 PT observed lying in bed with lunch at bedside PT did not eat and stated I'm not hungry and wishes to take a nap, Bed locked and in lowest position, call light within reach.

## 2024-09-04 ENCOUNTER — Inpatient Hospital Stay (HOSPITAL_COMMUNITY): Payer: MEDICAID

## 2024-09-04 DIAGNOSIS — K861 Other chronic pancreatitis: Secondary | ICD-10-CM

## 2024-09-04 LAB — COMPREHENSIVE METABOLIC PANEL WITH GFR
ALT: 19 U/L (ref 0–44)
AST: 42 U/L — ABNORMAL HIGH (ref 15–41)
Albumin: 1.7 g/dL — ABNORMAL LOW (ref 3.5–5.0)
Alkaline Phosphatase: 199 U/L — ABNORMAL HIGH (ref 38–126)
Anion gap: 12 (ref 5–15)
BUN: <5 mg/dL — ABNORMAL LOW (ref 6–20)
CO2: 24 mmol/L (ref 22–32)
Calcium: 7.8 mg/dL — ABNORMAL LOW (ref 8.9–10.3)
Chloride: 95 mmol/L — ABNORMAL LOW (ref 98–111)
Creatinine, Ser: 0.62 mg/dL (ref 0.61–1.24)
GFR, Estimated: >60 mL/min (ref >60)
Glucose, Bld: 99 mg/dL (ref 70–99)
Potassium: 4 mmol/L (ref 3.5–5.1)
Sodium: 131 mmol/L — ABNORMAL LOW (ref 135–145)
Total Bilirubin: 1.6 mg/dL — ABNORMAL HIGH (ref 0.0–1.2)
Total Protein: 5.1 g/dL — ABNORMAL LOW (ref 6.5–8.1)

## 2024-09-04 LAB — CBC
HCT: 28.6 % — ABNORMAL LOW (ref 39.0–52.0)
Hemoglobin: 9.4 g/dL — ABNORMAL LOW (ref 13.0–17.0)
MCH: 32.4 pg (ref 26.0–34.0)
MCHC: 32.9 g/dL (ref 30.0–36.0)
MCV: 98.6 fL (ref 80.0–100.0)
Platelets: 285 K/uL (ref 150–400)
RBC: 2.9 MIL/uL — ABNORMAL LOW (ref 4.22–5.81)
RDW: 12.7 % (ref 11.5–15.5)
WBC: 13.7 K/uL — ABNORMAL HIGH (ref 4.0–10.5)
nRBC: 0 % (ref 0.0–0.2)

## 2024-09-04 LAB — GLUCOSE, CAPILLARY
Glucose-Capillary: 123 mg/dL — ABNORMAL HIGH (ref 70–99)
Glucose-Capillary: 117 mg/dL — ABNORMAL HIGH (ref 70–99)
Glucose-Capillary: 122 mg/dL — ABNORMAL HIGH (ref 70–99)
Glucose-Capillary: 136 mg/dL — ABNORMAL HIGH (ref 70–99)

## 2024-09-04 LAB — MAGNESIUM: Magnesium: 1.7 mg/dL (ref 1.7–2.4)

## 2024-09-04 MED ORDER — FENTANYL CITRATE (PF) 100 MCG/2ML IJ SOLN
INTRAMUSCULAR | Status: AC | PRN
  Administered 2024-09-04 (×2): 50 ug via INTRAVENOUS

## 2024-09-04 MED ORDER — FENTANYL CITRATE (PF) 100 MCG/2ML IJ SOLN
INTRAMUSCULAR | Status: AC
  Filled 2024-09-04: qty 2

## 2024-09-04 MED ORDER — MIDAZOLAM HCL (PF) 2 MG/2ML IJ SOLN
INTRAMUSCULAR | Status: AC | PRN
  Administered 2024-09-04 (×2): 1 mg via INTRAVENOUS

## 2024-09-04 MED ORDER — LIDOCAINE HCL 1 % IJ SOLN
INTRAMUSCULAR | Status: AC
  Filled 2024-09-04: qty 10

## 2024-09-04 MED ORDER — MIDAZOLAM HCL 2 MG/2ML IJ SOLN
INTRAMUSCULAR | Status: AC
  Filled 2024-09-04: qty 2

## 2024-09-04 MED ORDER — LACTATED RINGERS IV SOLN: INTRAVENOUS | Status: AC

## 2024-09-04 MED ORDER — MAGNESIUM SULFATE 4 GM/100ML IV SOLN
4.0000 g | Freq: Once | INTRAVENOUS | Status: AC
  Administered 2024-09-04: 4 g via INTRAVENOUS
  Filled 2024-09-04 (×2): qty 100

## 2024-09-04 MED ORDER — HYDROMORPHONE HCL 1 MG/ML IJ SOLN
INTRAMUSCULAR | Status: AC
  Filled 2024-09-04: qty 1

## 2024-09-04 MED ORDER — SODIUM CHLORIDE 0.9 % IV SOLN
1.0000 g | Freq: Three times a day (TID) | INTRAVENOUS | Status: DC
  Administered 2024-09-04 – 2024-09-06 (×7): 1 g via INTRAVENOUS
  Filled 2024-09-04 (×8): qty 20

## 2024-09-04 MED ORDER — LIDOCAINE 1 % OPTIME INJ - NO CHARGE
10.0000 mL | Freq: Once | INTRAMUSCULAR | Status: AC
  Administered 2024-09-04: 10 mL via INTRADERMAL

## 2024-09-04 NOTE — Progress Notes (Signed)
 Progress Note   Subjective  Patient states he is not feeling very well today.  He has had fevers overnight, states pain appears worse.   Objective   Vital signs in last 24 hours: Temp:  [99 F (37.2 C)-102.7 F (39.3 C)] 99.1 F (37.3 C) (11/12 0813) Pulse Rate:  [101-110] 101 (11/12 0404) Resp:  [18-20] 20 (11/12 0404) BP: (103-129)/(63-84) 129/84 (11/12 0404) SpO2:  [96 %-99 %] 99 % (11/12 0404) Last BM Date : 09/03/24 General:    white male in NAD, uncomfortable Abdomen:  Soft, significant tenderness in upper abdomen,  Neurologic:  Alert and oriented,  grossly normal neurologically. Psych:  Cooperative. Normal mood and affect.  Intake/Output from previous day: 11/11 0701 - 11/12 0700 In: 1489.7 [I.V.:1389.7; IV Piggyback:100] Out: 1850 [Urine:1850] Intake/Output this shift: No intake/output data recorded.  Lab Results: Recent Labs    09/03/24 0210 09/03/24 1347 09/04/24 0323  WBC 17.0* 16.7* 13.7*  HGB 7.5* 10.3* 9.4*  HCT 23.2* 30.5* 28.6*  PLT 416* 350 285   BMET Recent Labs    09/02/24 1804 09/03/24 0210 09/04/24 0323  NA 133* 130* 131*  K 4.0 4.4 4.0  CL 100 98 95*  CO2 21* 20* 24  GLUCOSE 158* 150* 99  BUN <5* <5* <5*  CREATININE 0.51* 0.58* 0.62  CALCIUM 7.8* 7.7* 7.8*   LFT Recent Labs    09/04/24 0323  PROT 5.1*  ALBUMIN 1.7*  AST 42*  ALT 19  ALKPHOS 199*  BILITOT 1.6*   PT/INR Recent Labs    09/03/24 0210  LABPROT 16.0*  INR 1.2    Studies/Results: MR ABDOMEN MRCP W WO CONTAST Result Date: 09/03/2024 CLINICAL DATA:  Jaundice and abdominal pain.  Pancreatitis. EXAM: MRI ABDOMEN WITHOUT AND WITH CONTRAST (INCLUDING MRCP) TECHNIQUE: Multiplanar multisequence MR imaging of the abdomen was performed both before and after the administration of intravenous contrast. Heavily T2-weighted images of the biliary and pancreatic ducts were obtained, and three-dimensional MRCP images were rendered by post processing. CONTRAST:  7mL  GADAVIST GADOBUTROL 1 MMOL/ML IV SOLN COMPARISON:  CT scan from 09/02/2024 FINDINGS: Lower chest: Dependent atelectasis noted, right greater than left. Hepatobiliary: Liver measures 21.5 cm craniocaudal length. Left hepatic lobe is markedly atrophic. Heterogeneous enhancement noted throughout the liver on arterial phase imaging with more homogeneous enhancement on later postcontrast series. No intrahepatic biliary duct dilatation. Common duct measures 6-7 mm in the hepatoduodenal ligament. Evaluation of the common bile duct is limited by motion artifact. There does appear to be some mass-effect on the common bile duct in the head of the pancreas although node definite choledocholithiasis. Pancreas: As on CT, there is diffuse pancreatic and peripancreatic inflammatory change. Main pancreatic duct is dilated up to 7 mm in the head of the pancreas and then tapers into the ampullary portion suggesting some mass-effect. There is some hyperenhancement in the wall of the duodenum in the region of the ampulla with some mural irregularity (see axial images 97-102 of series 1301). Multiple peripancreatic rim enhancing complex fluid collections are evident. Index 2.6 x 1.9 cm rim enhancing collection identified inferior to the tail the pancreas on 99/1302. Multiloculated retroperitoneal collection anterior to the right kidney measures 10.9 x 2.7 cm on 129/1302. More inferiorly is a complex multicystic conglomeration of rim enhancing fluid collections measuring 8.1 x 4.6 cm on 155/1302. Spleen:  No splenomegaly. No suspicious focal mass lesion. Adrenals/Urinary Tract: No adrenal nodule or mass. Small area of hypo perfusion identified lower pole  right kidney is seen on recent CT. This is immediately adjacent to an inflammatory lesion in the retroperitoneal space. No hydronephrosis in either kidney. Stomach/Bowel: Stomach is decompressed. Wall thickening noted in the duodenum relatively diffusely in presumably secondary. No small  bowel or colonic dilatation within the visualized abdomen. Vascular/Lymphatic: Portal vein is patent. Superior mesenteric vein is patent. The splenic vein is attenuated distally towards the portal splenic confluence, but appears to be patent. Other:  None. Musculoskeletal: No focal suspicious marrow enhancement within the visualized bony anatomy. IMPRESSION: 1. Diffuse pancreatic and peripancreatic inflammatory change with multiple rim enhancing complex fluid collections in the peripancreatic space and retroperitoneum. Imaging features compatible with acute pancreatitis with multiple pseudocysts. Superinfection of the pseudocysts or abscess not excluded. Close follow-up warranted to exclude the possibility of underlying pancreatic mass lesion. 2. Main pancreatic duct is dilated up to 7 mm in the head of the pancreas and then tapers into the ampullary portion. There is some hyperenhancement in the wall of the duodenum in the region of the ampulla with some mural irregularity. This is presumably secondary to inflammation. Follow-up recommended to ensure resolution. 3. Hepatomegaly. Left hepatic lobe is markedly atrophic. 4. No substantial biliary dilatation. MRCP imaging is motion degraded but no definite choledocholithiasis. 5. Small area of hypo perfusion lower pole right kidney as seen on recent CT. This is immediately adjacent to an inflammatory lesion in the retroperitoneal space and presumably secondary to the adjacent inflammatory process. 6. Dependent atelectasis, right greater than left. Electronically Signed   By: Camellia Candle M.D.   On: 09/03/2024 05:16   MR 3D Recon At Scanner Result Date: 09/03/2024 CLINICAL DATA:  Jaundice and abdominal pain.  Pancreatitis. EXAM: MRI ABDOMEN WITHOUT AND WITH CONTRAST (INCLUDING MRCP) TECHNIQUE: Multiplanar multisequence MR imaging of the abdomen was performed both before and after the administration of intravenous contrast. Heavily T2-weighted images of the biliary  and pancreatic ducts were obtained, and three-dimensional MRCP images were rendered by post processing. CONTRAST:  7mL GADAVIST GADOBUTROL 1 MMOL/ML IV SOLN COMPARISON:  CT scan from 09/02/2024 FINDINGS: Lower chest: Dependent atelectasis noted, right greater than left. Hepatobiliary: Liver measures 21.5 cm craniocaudal length. Left hepatic lobe is markedly atrophic. Heterogeneous enhancement noted throughout the liver on arterial phase imaging with more homogeneous enhancement on later postcontrast series. No intrahepatic biliary duct dilatation. Common duct measures 6-7 mm in the hepatoduodenal ligament. Evaluation of the common bile duct is limited by motion artifact. There does appear to be some mass-effect on the common bile duct in the head of the pancreas although node definite choledocholithiasis. Pancreas: As on CT, there is diffuse pancreatic and peripancreatic inflammatory change. Main pancreatic duct is dilated up to 7 mm in the head of the pancreas and then tapers into the ampullary portion suggesting some mass-effect. There is some hyperenhancement in the wall of the duodenum in the region of the ampulla with some mural irregularity (see axial images 97-102 of series 1301). Multiple peripancreatic rim enhancing complex fluid collections are evident. Index 2.6 x 1.9 cm rim enhancing collection identified inferior to the tail the pancreas on 99/1302. Multiloculated retroperitoneal collection anterior to the right kidney measures 10.9 x 2.7 cm on 129/1302. More inferiorly is a complex multicystic conglomeration of rim enhancing fluid collections measuring 8.1 x 4.6 cm on 155/1302. Spleen:  No splenomegaly. No suspicious focal mass lesion. Adrenals/Urinary Tract: No adrenal nodule or mass. Small area of hypo perfusion identified lower pole right kidney is seen on recent CT. This is  immediately adjacent to an inflammatory lesion in the retroperitoneal space. No hydronephrosis in either kidney.  Stomach/Bowel: Stomach is decompressed. Wall thickening noted in the duodenum relatively diffusely in presumably secondary. No small bowel or colonic dilatation within the visualized abdomen. Vascular/Lymphatic: Portal vein is patent. Superior mesenteric vein is patent. The splenic vein is attenuated distally towards the portal splenic confluence, but appears to be patent. Other:  None. Musculoskeletal: No focal suspicious marrow enhancement within the visualized bony anatomy. IMPRESSION: 1. Diffuse pancreatic and peripancreatic inflammatory change with multiple rim enhancing complex fluid collections in the peripancreatic space and retroperitoneum. Imaging features compatible with acute pancreatitis with multiple pseudocysts. Superinfection of the pseudocysts or abscess not excluded. Close follow-up warranted to exclude the possibility of underlying pancreatic mass lesion. 2. Main pancreatic duct is dilated up to 7 mm in the head of the pancreas and then tapers into the ampullary portion. There is some hyperenhancement in the wall of the duodenum in the region of the ampulla with some mural irregularity. This is presumably secondary to inflammation. Follow-up recommended to ensure resolution. 3. Hepatomegaly. Left hepatic lobe is markedly atrophic. 4. No substantial biliary dilatation. MRCP imaging is motion degraded but no definite choledocholithiasis. 5. Small area of hypo perfusion lower pole right kidney as seen on recent CT. This is immediately adjacent to an inflammatory lesion in the retroperitoneal space and presumably secondary to the adjacent inflammatory process. 6. Dependent atelectasis, right greater than left. Electronically Signed   By: Camellia Candle M.D.   On: 09/03/2024 05:16   CT ABDOMEN PELVIS W CONTRAST Result Date: 09/02/2024 CLINICAL DATA:  Abdominal pain. EXAM: CT ABDOMEN AND PELVIS WITH CONTRAST TECHNIQUE: Multidetector CT imaging of the abdomen and pelvis was performed using the  standard protocol following bolus administration of intravenous contrast. RADIATION DOSE REDUCTION: This exam was performed according to the departmental dose-optimization program which includes automated exposure control, adjustment of the mA and/or kV according to patient size and/or use of iterative reconstruction technique. CONTRAST:  75mL OMNIPAQUE  IOHEXOL  350 MG/ML SOLN COMPARISON:  08/24/2024 FINDINGS: Lower chest: Asymmetric elevation right hemidiaphragm with basilar atelectasis, right greater than left. Hepatobiliary: No suspicious focal abnormality within the liver parenchyma. Heterogeneity in the anterior hepatic dome is probably related to geographic fatty deposition. Gallbladder is surgically absent. No intrahepatic or extrahepatic biliary dilation. Pancreas: Peripancreatic edema/inflammation evident most prominent along the head and body of pancreas. These extraperitoneal inflammatory changes track inferiorly towards the pelvis. Pancreatic parenchyma enhances throughout. Interval progression of ill-defined complex fluid collections in the right retroperitoneal space. Collection posterior to the ascending colon measures 5.1 x 2.8 cm today compared to 3.8 x 2.1 cm when remeasured in a similar fashion on the prior study. Collection between the duodenum and right kidney on 50/3 measures 4.4 x 2.8 cm today compared to 4.0 x 1.8 cm previously. Additional ill-defined collections are seen anterior to the right psoas muscle tracking down towards the pelvis, similar to prior. Spleen: No splenomegaly. No suspicious focal mass lesion. Adrenals/Urinary Tract: No adrenal nodule or mass. Areas of ill-defined hypoperfusion in the inferior right kidney (52/3 and 55/3) are new in the interval. Left kidney unremarkable. No evidence for hydroureter. Mild bladder wall thickening noted anteriorly. Stomach/Bowel: Stomach is unremarkable. No gastric wall thickening. No evidence of outlet obstruction. Duodenum is normally  positioned as is the ligament of Treitz. No small bowel wall thickening. No small bowel dilatation. The terminal ileum is normal. The appendix is not well visualized, but there is no edema or  inflammation in the region of the cecal tip to suggest appendicitis. Edema/inflammation is seen along the course of the proximal sigmoid colon. Vascular/Lymphatic: Portal vein, superior mesenteric vein, and splenic vein remain patent. No acute abnormality of the celiac axis or SMA. There is no gastrohepatic or hepatoduodenal ligament lymphadenopathy. No retroperitoneal or mesenteric lymphadenopathy. No pelvic sidewall lymphadenopathy. Reproductive: The prostate gland and seminal vesicles are unremarkable. Other: Trace free fluid is seen in the paracolic gutters bilaterally. Musculoskeletal: No worrisome lytic or sclerotic osseous abnormality. IMPRESSION: 1. Interval progression of ill-defined complex fluid collections in the right retroperitoneal space. Collection posterior to the ascending colon measures 5.1 x 2.8 cm today compared to 3.8 x 2.1 cm when remeasured in a similar fashion on the prior study. Collection between the duodenum and right kidney measures 4.4 x 2.8 cm today compared to 4.0 x 1.8 cm previously. These are compatible with evolving pseudocysts. Additional ill-defined collections are seen anterior to the right psoas muscle tracking down towards the pelvis, similar to prior. 2. Areas of ill-defined hypoperfusion in the inferior right kidney are new in the interval. Imaging features may be secondary to the adjacent inflammatory changes although pyelonephritis could have this appearance. 3. Edema/inflammation along the course of the proximal sigmoid colon. This is likely secondary to the pancreatic inflammatory process. 4. Mild bladder wall thickening anteriorly. Correlation with urinalysis suggested. 5. Asymmetric elevation right hemidiaphragm with basilar atelectasis, right greater than left. Electronically  Signed   By: Camellia Candle M.D.   On: 09/02/2024 11:40       Assessment / Plan:    38 y/o male here with the following  Pancreatitis - likely EtOH related Peripancreatic fluid collections Fever Elevated liver enzymes - improving, suspect resolving EtOH hepatitis  Enlarging peripancreatic fluid collections on MRCP.  He has developed fevers yesterday and overnight.  Have consulted IR for aspiration of dominant fluid collection to rule out abscess.  Recommend broadening antibiotics to carbapenem.  His leukocytosis has improved with initiation of antibiotics thus far.  Clinically he looks worse this AM, having worsening pain, very uncomfortable.  We have spoken with IR this morning and they are planning aspiration.  I will reach out to my advanced endoscopy colleagues to discuss if he is a candidate for EUS guided drainage, my suspicion is that he is not acutely a candidate for that as these are not mature enough for it at present time.  Liver enzymes continue to downtrend, suspect he probably had alcoholic hepatitis that is resolving.  Once through this acute course recommend EGD to evaluate duodenal findings, although this is likely reactive from his pancreatitis.  He will need follow-up EUS or MRCP as outpatient once through this acute course as well.  Anticipate prolonged hospitalization for complicated pancreatitis.  Awaiting IR evaluation today.  Will follow, call with questions.  Marcey Naval, MD [facility]

## 2024-09-04 NOTE — Plan of Care (Signed)
 FMTS Interim Progress Note  S: Patient resting in bed following aspiration of pancreatic pseudocyst. In no acute distress and reports no complaints. Abdomen soft, non-tender, with normal bowel sounds. Post-procedure site on right abdomen is covered with a clean, dry dressing; no warmth, erythema, or drainage noted.  O: BP (!) 140/97 (BP Location: Left Arm)   Pulse 89   Temp 99.1 F (37.3 C) (Oral)   Resp 18   Ht 5' 9 (1.753 m)   Wt 68 kg   SpO2 99%   BMI 22.14 kg/m     A/P: - Continue with plan as outlined in day progress note - Monitor for abdominal pain, fevers, and post-procedure site for any signs of infection, bleeding, or leakage - Encourage routine activity as tolerated  Elodie Palma, MD 09/04/2024, 4:57 PM PGY-1, [facility] Family Medicine Service pager [phone]

## 2024-09-04 NOTE — Assessment & Plan Note (Addendum)
 History of withdrawal seizures. Last drink 3 weeks ago per patient. Ethanol <15 on admission 11/10. Liver enzymes mildly elevated but improving.  - Discontinue CIWA q6h - Hydroxyzine  25 mg TID prn for anxiety - Thiamine  100 mg daily - Folic acid 1 mg daily

## 2024-09-04 NOTE — Progress Notes (Signed)
 Daily Progress Note Intern Pager: [phone]  Patient name: Jeff Frieden Medical record number: [id number] Date of birth: 21-Oct-1986 Age: 38 y.o. Gender: male  Primary Care Provider: Delayne Artist PARAS, MD Consultants: GI, Gen Surg (not officially consulted, s/o) IR Code Status: Full  Pt Overview and Major Events to Date:  - 11/10: Admitted, GI and Gen Surg consulted - 11/11: MRCP  Medical Decision Making:  Kalum Minner is a 37 y.o. male with PMH of PTSD, depression (hx suicide attempt), Alcohol use disorder, GERD, T2DM. Admitted for pancreatitis with enlarging pseudocyst. MRCP done 11/11 with retroperitoneal fluid collection, still with pseudocysts. GI has consulted IR for pseudocyst drainage given enlarged appearance compared to prior imaging done 07/2024; procedure scheduled for today 11/12 around 11am. Assessment & Plan Pancreatitis Pancreatic pseudocyst - GI consulted, appreciate recs - IR consulted by GI, anticipating pseudocyst aspiration today 11/12 - Abx: s/p IV Zosyn (11/11-11/12) - CHANGE to IV carbapenem per GI; meropenem 1 g q8h (11/12- ) - Fluids: maintenance with LR at 125 mL/hr (fell off last night 11/11 around 2130, reordered today given currently NPO for upcoming procedure) - Pain: Oxycodone 5-10mg  q4h PRN for moderate-severe pain; Dilaudid  0.5mg  q2h for breakthrough pain - Nausea: Zofran  ODT 4 mg q6h scheduled, phenergan 6.25 mg IV q6h prn - Advance as tolerated and following GI recs - AM Labs: CMP, Mag, CBC - Repleting electrolytes as needed; given IV Mag 4mg  this AM - Blood cultures: no growth < 24 hours - Strict Is/Os Acute anemia Hgb with slight decrease at 9.4 today from 10.3 yesterday. - Continue to monitor - Transfusion goal of > 7.5 Alcohol use disorder Elevated liver enzymes History of withdrawal seizures. Last drink 3 weeks ago per patient. Ethanol <15 on admission 11/10. Liver enzymes mildly elevated but improving.  - Discontinue CIWA q6h -  Hydroxyzine  25 mg TID prn for anxiety - Thiamine  100 mg daily - Folic acid 1 mg daily Chronic health problem GERD: Protonix  40 mg daily DMT2: Holding home Lantus 5 units daily, last A1c 8.0; on sSSI in hospital, checking CBGs TID with meals and at bedtime Insomnia: Melatonin prn at bedtime for sleep, also has prn atarax ; consider benadryl po if needed for sleep  FEN/GI: NPO currently (in anticipation of procedure; has been CLD) PPx: SCDs Dispo: Pending clinical improvement.  Subjective:  Patient reports he continues to feel poorly with nausea and stabbing abdominal pain.  No vomiting.  He did have a bowel movement yesterday without issue.  He was able to sleep 4 hours last night, which is an improvement.  Objective: Temp:  [98.6 F (37 C)-102.7 F (39.3 C)] 100.4 F (38 C) (11/12 0404) Pulse Rate:  [101-112] 101 (11/12 0404) Resp:  [18-26] 20 (11/12 0404) BP: (103-129)/(63-84) 129/84 (11/12 0404) SpO2:  [96 %-100 %] 99 % (11/12 0404) Physical Exam: General: Patient lying on right side in bed, no acute distress Cardiovascular: Regular rate and rhythm, no murmurs/rubs/gallops. Respiratory: Normal work of breathing on room air. Clear to auscultation bilaterally; no wheezes, crackles. Abdomen: Bowel sounds present and normoactive bilaterally.  Tender to minimal palpation diffusely of abdomen, worse in lower abdominal quadrants; guarding present (difficult to discern voluntary from involuntary), abdomen soft. Extremities: Skin warm, dry. No bilateral lower extremity edema.  Laboratory: Most recent CBC Lab Results  Component Value Date   WBC 13.7 (H) 09/04/2024   HGB 9.4 (L) 09/04/2024   HCT 28.6 (L) 09/04/2024   MCV 98.6 09/04/2024   PLT 285 09/04/2024  Most recent BMP    Latest Ref Rng & Units 09/04/2024    3:23 AM  BMP  Glucose 70 - 99 mg/dL 99   BUN 6 - 20 mg/dL <5   Creatinine 9.38 - 1.24 mg/dL 9.37   Sodium 864 - 854 mmol/L 131   Potassium 3.5 - 5.1 mmol/L 4.0    Chloride 98 - 111 mmol/L 95   CO2 22 - 32 mmol/L 24   Calcium 8.9 - 10.3 mg/dL 7.8   Mag: 1.7   [name], MD 09/04/2024, 7:34 AM  PGY-1,  Family Medicine FPTS Intern pager: [phone], text pages welcome Secure chat group [facility] Teaching Service

## 2024-09-04 NOTE — Assessment & Plan Note (Addendum)
 GERD: Protonix  40 mg daily DMT2: Holding home Lantus 5 units daily, last A1c 8.0; on sSSI in hospital, checking CBGs TID with meals and at bedtime Insomnia: Melatonin prn at bedtime for sleep, also has prn atarax ; consider benadryl po if needed for sleep

## 2024-09-04 NOTE — Consult Note (Signed)
 Chief Complaint: Patient was seen in consultation today for  Chief Complaint  Patient presents with   Abdominal Pain   Referring Physician(s): Vina Dasen, NP   Supervising Physician: Hughes Simmonds  Patient Status: [facility] - Out-pt  History of Present Illness: Aaron Burke is a 38 y.o. male with a medical history significant for plaque psoriasis with psoriatic arthritis, DM, alcohol use, MDD, PTSD, pancreatitis and pancreatic pseudocysts. He has had several ED visits/hospital admissions for issues related to pancreatitis and pancreatic pseudocysts.   He most recently presented to the ED 09/02/24 with abdominal pain.  In the ED he was found to have elevated lipase and pancreatitis on CT with enlarging pseudocyst. He was admitted for further work up and treated conservatively with supportive care. His abdominal pain persisted and he became febrile. An MRCP was obtained 09/03/24 and this showed multiple peripancreatic rim enhancing complex fluid collections.   Interventional Radiology has been asked to evaluate this patient for an image-guided pancreatic pseudocyst aspiration. Imaging reviewed and procedure approved by Dr. Hughes.   Past Medical History:  Diagnosis Date   Gallstone pancreatitis 2020   GERD (gastroesophageal reflux disease)    Plaque psoriasis    Psoriatic arthritis (HCC)    Testicular torsion 2020    Past Surgical History:  Procedure Laterality Date   CHOLECYSTECTOMY     ESOPHAGOGASTRODUODENOSCOPY  2020   with dilatation   KNEE RECONSTRUCTION Left 08/30/2004   LEFT HEART CATH AND CORONARY ANGIOGRAPHY N/A 09/29/2020   Procedure: LEFT HEART CATH AND CORONARY ANGIOGRAPHY;  Surgeon: Jordan, Peter M, MD;  Location: [facility] INVASIVE CV LAB;  Service: Cardiovascular;  Laterality: N/A;    Allergies: Percocet [oxycodone-acetaminophen ]  Medications: Prior to Admission medications   Medication Sig Start Date End Date Taking? Authorizing Provider  acetaminophen   (TYLENOL ) 500 MG tablet Take 2 tablets (1,000 mg total) by mouth every 6 (six) hours as needed (pain). 08/20/24  Yes Alena Morrison, Reagan, MD  folic acid (FOLVITE) 1 MG tablet Take 1 tablet (1 mg total) by mouth daily. 08/21/24  Yes Alena Morrison, Reagan, MD  insulin glargine (LANTUS) 100 UNIT/ML injection Inject 0.05 mLs (5 Units total) into the skin daily. Patient taking differently: Inject 10 Units into the skin daily. 08/25/24  Yes Beverley Leita LABOR, PA-C  oxyCODONE (OXY IR/ROXICODONE) 5 MG immediate release tablet Take 1 tablet (5 mg total) by mouth every 6 (six) hours as needed for severe pain (pain score 7-10). 08/25/24  Yes Beverley Leita LABOR, PA-C  pantoprazole  (PROTONIX ) 40 MG tablet Take 1 tablet (40 mg total) by mouth daily. 08/21/24  Yes Alena Morrison, Reagan, MD  PHENobarbital (LUMINAL) 32.4 MG tablet On 10/28, take one tab at 10 pm On 10/29, take one tab every 8 hours (3 times in the day) On 10/30 and 10/31, take one tab every 12 hours (2 times in the day) On 11/1 and 11/2, take one tab only (once in the day) Patient taking differently: Take 32.4 mg by mouth See admin instructions. On 10/28, take one tab at 10 pm On 10/29, take one tab every 8 hours (3 times in the day) On 10/30 and 10/31, take one tab every 12 hours (2 times in the day) On 11/1 and 11/2, take one tab only (once in the day) 08/20/24  Yes Alena Morrison, Reagan, MD  thiamine  (VITAMIN B1) 100 MG tablet Take 1 tablet (100 mg total) by mouth daily. 08/21/24  Yes Alena Morrison, Reagan, MD  DULoxetine  (CYMBALTA ) 30 MG capsule Take 1  capsule (30 mg total) by mouth daily. Patient not taking: Reported on 09/02/2024 08/20/24   Alena Morrison, Reagan, MD  Multiple Vitamin (MULTIVITAMIN WITH MINERALS) TABS tablet Take 1 tablet by mouth daily. 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  Enjoys golf, hunting, surfing, outdoor sports.    Social Drivers of Corporate Investment Banker Strain: Not on file  Food Insecurity: No Food Insecurity (09/02/2024)   Hunger Vital Sign    Worried About Running Out of Food in the Last Year: Never true    Ran Out of Food in the Last Year: Never true  Transportation Needs: No Transportation Needs (09/02/2024)   PRAPARE - Administrator, Civil Service (Medical): No    Lack of Transportation (Non-Medical): No  Physical Activity: Not on file  Stress: Not on file  Social Connections: Unknown (11/30/2022)   Received from [facility]   Social Network    Social Network: Not on file    Review of Systems: A 12 point ROS discussed and pertinent positives are indicated in the HPI above.  All other systems are negative.  Review of Systems  Constitutional:  Positive for appetite change, fatigue and fever.  Gastrointestinal:  Positive for abdominal pain.  Neurological:  Positive for weakness.    Vital  Signs: BP 129/84 (BP Location: Left Arm)   Pulse (!) 101   Temp (!) 100.4 F (38 C) (Oral)   Resp 20   Ht 5' 9 (1.753 m)   Wt 149 lb 14.6 oz (68 kg)   SpO2 99%   BMI 22.14 kg/m   Physical Exam Constitutional:      General: He is not in acute distress.    Appearance: He is not ill-appearing.  HENT:     Mouth/Throat:     Mouth: Mucous membranes are moist.     Pharynx: Oropharynx is clear.  Cardiovascular:     Rate and Rhythm: Tachycardia present.  Pulmonary:     Effort: Pulmonary effort is normal.  Abdominal:     Tenderness: There is abdominal tenderness.  Skin:    General: Skin is warm and dry.  Neurological:     Mental Status: He is alert and oriented to person, place, and time.  Imaging:   Labs:  CBC: Recent Labs    09/02/24 0713 09/02/24 1022 09/02/24 1445 09/03/24 0210 09/03/24 1347 09/04/24 0323  WBC 15.3*  --   --  17.0* 16.7* 13.7*  HGB 12.0*   < > 11.2* 7.5* 10.3* 9.4*  HCT 36.7*   < > 33.0* 23.2* 30.5* 28.6*  PLT 558*  --   --  416* 350 285   < > = values in this interval not displayed.    COAGS: Recent Labs    08/15/24 1900 09/03/24 0210  INR 1.0 1.2    BMP: Recent Labs    09/02/24 1439 09/02/24 1445 09/02/24 1804 09/03/24 0210 09/04/24 0323  NA 135 134* 133* 130* 131*  K 3.9 3.7 4.0 4.4 4.0  CL 101  --  100 98 95*  CO2 20*  --  21* 20* 24  GLUCOSE 143*  --  158* 150* 99  BUN <5*  --  <5* <5* <5*  CALCIUM 7.7*  --  7.8* 7.7* 7.8*  CREATININE 0.58*  --  0.51* 0.58* 0.62  GFRNONAA >60  --  >60 >60 >60    LIVER FUNCTION TESTS: Recent Labs    08/24/24 1935 09/02/24 0713 09/03/24 0210 09/04/24 0323  BILITOT 1.7* 1.7* 1.8* 1.6*  AST 137* 82* 56* 42*  ALT 67* 31 22 19   ALKPHOS 464* 261* 199* 199*  PROT 5.9* 6.3* 4.9* 5.1*  ALBUMIN 2.1* 2.4* 1.9* 1.7*    TUMOR MARKERS: No results for input(s): AFPTM, CEA, CA199, CHROMGRNA in the last 8760 hours.  Assessment and Plan:  Pancreatic pseudocyst: Gavon Majano, 38 year old male, is tentatively scheduled today for an image-guided pancreatic pseudocyst aspiration. The procedure was discussed at the bedside with the patient. Patient states he has has this procedure done previously and had a shunt to his intestine for about a week. This was in 2020.   Risks and benefits discussed with the patient including bleeding, infection, damage to adjacent structures, bowel perforation/fistula connection, and sepsis.  All of the patient's questions were answered, patient is agreeable to proceed. He has been NPO. He is not receiving any blood-thinning medications.   Consent signed and in chart.  Thank you for this interesting consult.  I greatly enjoyed meeting Vansh Reckart and look forward to participating in their care.  A copy of this report was sent to the requesting provider on this date.  Electronically Signed: Warren Dais, AGACNP-BC 09/04/2024, 7:58 AM   I spent a total of 20 Minutes    in face to face in clinical consultation, greater than 50% of which was counseling/coordinating care for pancreatic pseudocyst.

## 2024-09-04 NOTE — Assessment & Plan Note (Addendum)
-   GI consulted, appreciate recs - IR consulted by GI, anticipating pseudocyst aspiration today 11/12 - Abx: s/p IV Zosyn (11/11-11/12) - CHANGE to IV carbapenem per GI; meropenem 1 g q8h (11/12- ) - Fluids: maintenance with LR at 125 mL/hr (fell off last night 11/11 around 2130, reordered today given currently NPO for upcoming procedure) - Pain: Oxycodone 5-10mg  q4h PRN for moderate-severe pain; Dilaudid  0.5mg  q2h for breakthrough pain - Nausea: Zofran  ODT 4 mg q6h scheduled, phenergan 6.25 mg IV q6h prn - Advance as tolerated and following GI recs - AM Labs: CMP, Mag, CBC - Repleting electrolytes as needed; given IV Mag 4mg  this AM - Blood cultures: no growth < 24 hours - Strict Is/Os

## 2024-09-04 NOTE — Procedures (Signed)
 Vascular and Interventional Radiology Procedure Note  Patient: Aaron Burke DOB: Jan 20, 1986 Medical Record Number: [id number] Note Date/Time: 09/04/24 4:02 PM   Performing Physician: Thom Hall, MD Assistant(s): None  Diagnosis: Pancreas pseudocyst  Procedure: ASPIRATION OF A PANCREATIC PSEUDOCYST  Anesthesia: Conscious Sedation Complications: None Estimated Blood Loss: Minimal Specimens: Sent for Gram Stain, Aerobe Culture, Anerobe Culture, and Fungal  Findings:  Successful CT-guided aspiration of a Pancreas pseudocyst.  5 mL drawn and submitted for microbiological analysis   See detailed procedure note with images in PACS. The patient tolerated the procedure well without incident or complication and was returned to Recovery in stable condition.    Thom Hall, MD Vascular and Interventional Radiology Specialists [facility] Radiology   Pager. [phone] Clinic. [phone]

## 2024-09-04 NOTE — TOC Initial Note (Signed)
 Transition of Care [facility]) - Initial/Assessment Note    Patient Details  Name: Aaron Burke MRN: [id number] Date of Birth: 12-26-1985  [facility]) CM/SW Contact:    Nola Devere Hands, RN Phone Number: 09/04/2024, 9:43 AM  Clinical Narrative:                 Patient is a 38 y.o. male admitted with abdominal pain with a CT scan showing pancreatitis with enlarging pseudocyst. From home. Independent prior to admission. ICM Team will continue to follow.          Patient Goals and CMS Choice            Expected Discharge Plan and Services                                              Prior Living Arrangements/Services                       Activities of Daily Living   ADL Screening (condition at time of admission) Independently performs ADLs?: Yes (appropriate for developmental age) Is the patient deaf or have difficulty hearing?: No Does the patient have difficulty seeing, even when wearing glasses/contacts?: No Does the patient have difficulty concentrating, remembering, or making decisions?: No  Permission Sought/Granted                  Emotional Assessment              Admission diagnosis:  Pancreatitis [K85.90] Acute pancreatitis, unspecified complication status, unspecified pancreatitis type [K85.90] Patient Active Problem List   Diagnosis Date Noted   Acute anemia 09/03/2024   Pancreatic pseudocyst 09/02/2024   Acute pancreatitis 09/02/2024   Elevated liver enzymes 09/02/2024   Chronic pancreatitis (HCC) 08/25/2024   Refeeding syndrome 08/18/2024   Diabetes mellitus without complication (HCC) 08/18/2024   Hyperglycemia 08/16/2024   Dehydration 08/16/2024   Melena 08/16/2024   Pancreatitis 08/15/2024   Abdominal pain 08/15/2024   Chronic health problem 08/15/2024   Low back pain 02/24/2023   MDD (major depressive disorder), recurrent severe, without psychosis (HCC) 12/13/2022   Alcohol use disorder 12/13/2022    GERD (gastroesophageal reflux disease) 12/13/2022   Attention deficit hyperactivity disorder (ADHD) 12/13/2022   GAD (generalized anxiety disorder) 12/13/2022   Suicide attempt (HCC) 12/12/2022   Alcohol intoxication in active alcohol use disorder 12/12/2022   PTSD (post-traumatic stress disorder) 12/12/2022   Hyperlipidemia LDL goal <70 09/29/2020   Acute pancreatic fluid collection 09/29/2020   Elevated LFTs 09/29/2020   Pre-diabetes 09/29/2020   Chest pain with moderate risk for cardiac etiology 09/27/2020   PCP:  Delayne Artist PARAS, MD Pharmacy:   [id number] - [place], [place] - [address] [place] [address] Phone: [phone] Fax: [phone]  [id number] - [place], [place] - [address] AT [facility] [address] [place] [address] Phone: [phone] Fax: [phone]  [facility] [address] [place] [address] Phone: [phone] Fax: [phone]     Social Drivers of Health (SDOH) Social History: SDOH Screenings   Food Insecurity: No Food Insecurity (09/02/2024)  Housing: Low Risk  (09/02/2024)  Transportation Needs: No Transportation Needs (09/02/2024)  Utilities: Not At Risk (09/02/2024)  Alcohol Screen: Low Risk  (  12/13/2022)  Depression (PHQ2-9): Medium Risk (02/22/2023)  Social Connections: Unknown (11/30/2022)   Received from [facility]  Tobacco Use: Medium Risk (09/02/2024)   SDOH Interventions:     Readmission Risk Interventions     No data to display

## 2024-09-04 NOTE — Assessment & Plan Note (Signed)
 Hgb with slight decrease at 9.4 today from 10.3 yesterday. - Continue to monitor - Transfusion goal of > 7.5

## 2024-09-05 DIAGNOSIS — K8689 Other specified diseases of pancreas: Secondary | ICD-10-CM | POA: Diagnosis not present

## 2024-09-05 DIAGNOSIS — K861 Other chronic pancreatitis: Secondary | ICD-10-CM | POA: Diagnosis not present

## 2024-09-05 DIAGNOSIS — K859 Acute pancreatitis without necrosis or infection, unspecified: Secondary | ICD-10-CM | POA: Diagnosis not present

## 2024-09-05 DIAGNOSIS — E871 Hypo-osmolality and hyponatremia: Secondary | ICD-10-CM | POA: Insufficient documentation

## 2024-09-05 LAB — COMPREHENSIVE METABOLIC PANEL WITH GFR
ALT: 21 U/L (ref 0–44)
AST: 60 U/L — ABNORMAL HIGH (ref 15–41)
Albumin: 1.7 g/dL — ABNORMAL LOW (ref 3.5–5.0)
Alkaline Phosphatase: 199 U/L — ABNORMAL HIGH (ref 38–126)
Anion gap: 10 (ref 5–15)
BUN: <5 mg/dL — ABNORMAL LOW (ref 6–20)
CO2: 22 mmol/L (ref 22–32)
Calcium: 7.7 mg/dL — ABNORMAL LOW (ref 8.9–10.3)
Chloride: 94 mmol/L — ABNORMAL LOW (ref 98–111)
Creatinine, Ser: 0.54 mg/dL — ABNORMAL LOW (ref 0.61–1.24)
GFR, Estimated: >60 mL/min (ref >60)
Glucose, Bld: 128 mg/dL — ABNORMAL HIGH (ref 70–99)
Potassium: 4 mmol/L (ref 3.5–5.1)
Sodium: 126 mmol/L — ABNORMAL LOW (ref 135–145)
Total Bilirubin: 1.1 mg/dL (ref 0.0–1.2)
Total Protein: 5.1 g/dL — ABNORMAL LOW (ref 6.5–8.1)

## 2024-09-05 LAB — CBC
HCT: 28.5 % — ABNORMAL LOW (ref 39.0–52.0)
Hemoglobin: 9.6 g/dL — ABNORMAL LOW (ref 13.0–17.0)
MCH: 32.2 pg (ref 26.0–34.0)
MCHC: 33.7 g/dL (ref 30.0–36.0)
MCV: 95.6 fL (ref 80.0–100.0)
Platelets: 285 K/uL (ref 150–400)
RBC: 2.98 MIL/uL — ABNORMAL LOW (ref 4.22–5.81)
RDW: 12.7 % (ref 11.5–15.5)
WBC: 14.2 K/uL — ABNORMAL HIGH (ref 4.0–10.5)
nRBC: 0 % (ref 0.0–0.2)

## 2024-09-05 LAB — GLUCOSE, CAPILLARY
Glucose-Capillary: 139 mg/dL — ABNORMAL HIGH (ref 70–99)
Glucose-Capillary: 165 mg/dL — ABNORMAL HIGH (ref 70–99)
Glucose-Capillary: 109 mg/dL — ABNORMAL HIGH (ref 70–99)
Glucose-Capillary: 99 mg/dL (ref 70–99)

## 2024-09-05 LAB — MAGNESIUM: Magnesium: 1.8 mg/dL (ref 1.7–2.4)

## 2024-09-05 MED ORDER — OXYCODONE HCL 5 MG PO TABS
10.0000 mg | ORAL_TABLET | ORAL | Status: DC
Start: 2024-09-05 — End: 2024-09-06
  Administered 2024-09-05 – 2024-09-06 (×5): 10 mg via ORAL
  Filled 2024-09-05 (×5): qty 2

## 2024-09-05 MED ORDER — OXYCODONE HCL 5 MG PO TABS
10.0000 mg | ORAL_TABLET | ORAL | Status: DC
Start: 2024-09-05 — End: 2024-09-05

## 2024-09-05 MED ORDER — DIPHENHYDRAMINE HCL 25 MG PO CAPS
25.0000 mg | ORAL_CAPSULE | Freq: Every day | ORAL | Status: AC
  Administered 2024-09-05 – 2024-09-06 (×2): 25 mg via ORAL
  Filled 2024-09-05 (×2): qty 1

## 2024-09-05 MED ORDER — MAGNESIUM SULFATE 4 GM/100ML IV SOLN
4.0000 g | Freq: Once | INTRAVENOUS | Status: AC
  Administered 2024-09-05: 4 g via INTRAVENOUS
  Filled 2024-09-05: qty 100

## 2024-09-05 MED ORDER — HYDROMORPHONE HCL 1 MG/ML IJ SOLN: 0.5000 mg | INTRAMUSCULAR | Status: DC | PRN

## 2024-09-05 MED ORDER — ACETAMINOPHEN 325 MG PO TABS
650.0000 mg | ORAL_TABLET | Freq: Four times a day (QID) | ORAL | Status: DC
  Administered 2024-09-05 – 2024-09-09 (×13): 650 mg via ORAL
  Filled 2024-09-05 (×14): qty 2

## 2024-09-05 NOTE — Assessment & Plan Note (Addendum)
-   GI consulted, appreciate recs - Abx: IV meropenem 1 g q8h (11/12- ) - s/p IV Zosyn (11/11-11/12) - Pain: Oxycodone 5-10mg  q4h PRN for moderate-severe pain; Dilaudid  0.5mg  q2h for breakthrough pain > discontinue dilaudid , schedule oxy 10 mg q4h while awake, schedule tylenol  650 mg QID - Nausea: Zofran  ODT 4 mg q6h scheduled, phenergan 6.25 mg IV q6h prn - Advance diet to full liquid per GI recs - AM Labs: AM BMP, Mag, CBC - Repleting electrolytes as needed; supplementing IV Mag 4g this am - Will repeat EKG to check QTC - Blood cultures: No growth at 2 days - Pseudocyst culture: Pending - Strict Is/Os - TOC consulted to help with medication assistance given patient uninsured

## 2024-09-05 NOTE — Assessment & Plan Note (Addendum)
 Na today of 126; suspect dilution in setting of IV hydration. Have discontinued IV maintenance fluids. No symptoms of hyponatremia. - Encourage good po intake - Continue to monitor

## 2024-09-05 NOTE — Progress Notes (Signed)
 Daily Progress Note Intern Pager: [phone]  Patient name: Aaron Burke Medical record number: [id number] Date of birth: 08-19-1986 Age: 38 y.o. Gender: male  Primary Care Provider: Delayne Artist PARAS, MD Consultants: GI, Gen Surg (not officially consulted, s/o), IR  Code Status: Full  Pt Overview and Major Events to Date:  - 11/10: Admitted, GI and Gen Surg consulted - 11/11: MRCP - 11/12: Pseudocyst aspiration  Medical Decision Making:  Aaron Burke is a 38 y.o. male with PMH of PTSD, depression (hx suicide attempt), Alcohol use disorder, GERD, T2DM. Admitted for pancreatitis with enlarging pseudocyst. IR performed pseudocyst aspiration 11/12. Aspiration with few WBC, no organisms; culture pending. Has remained afebrile overnight; last fever 11/12 at 0400. Assessment & Plan Acute on chronic pancreatitis (HCC) Peripancreatic fluid collection - GI consulted, appreciate recs - Abx: IV meropenem 1 g q8h (11/12- ) - s/p IV Zosyn (11/11-11/12) - Pain: Oxycodone 5-10mg  q4h PRN for moderate-severe pain; Dilaudid  0.5mg  q2h for breakthrough pain > discontinue dilaudid , schedule oxy 10 mg q4h while awake, schedule tylenol  650 mg QID - Nausea: Zofran  ODT 4 mg q6h scheduled, phenergan 6.25 mg IV q6h prn - Advance diet to full liquid per GI recs - AM Labs: AM BMP, Mag, CBC - Repleting electrolytes as needed; supplementing IV Mag 4g this am - Will repeat EKG to check QTC - Blood cultures: No growth at 2 days - Pseudocyst culture: Pending - Strict Is/Os - TOC consulted to help with medication assistance given patient uninsured Acute anemia Hgb stable at 9.6 today from 9.4 yesterday. - Continue to monitor - Transfusion goal of > 7.5 Hyponatremia Na today of 126; suspect dilution in setting of IV hydration. Have discontinued IV maintenance fluids. No symptoms of hyponatremia. - Encourage good po intake - Continue to monitor Alcohol use disorder Elevated liver enzymes Stable elevated  liver enzymes. - Hydroxyzine  25 mg TID prn for anxiety - Thiamine  100 mg daily - Folic acid 1 mg daily Chronic health problem GERD: Protonix  40 mg daily DMT2: Holding home Lantus 5 units daily, last A1c 8.0; on sSSI in hospital, checking CBGs TID with meals and at bedtime Insomnia: Discontinue Melatonin; start PO benadryl 25 mg daily at bedtime; will discontinue atarax    FEN/GI: Full liquid diet PPx: SCDs Dispo: Home pending continued improvement in clinical state and continued weaning of pain medications and advancement of diet  Subjective:  Patient reports continued abdominal pain, though this is overall better compared to admission.  He denies nausea or vomiting.  No other reported symptoms or concerns outside of continuing to sleep poorly and hoping for an advancement in his diet.  Objective: Temp:  [98.4 F (36.9 C)-99.9 F (37.7 C)] 98.6 F (37 C) (11/13 1138) Pulse Rate:  [82-99] 86 (11/13 1138) Resp:  [14-23] 19 (11/13 1138) BP: (104-140)/(68-99) 123/88 (11/13 1138) SpO2:  [95 %-100 %] 100 % (11/13 1138) Physical Exam: General: Patient lying back in bed, no acute distress. Cardiovascular: Regular rate and rhythm, no murmurs/rubs/gallops. Respiratory: Normal work of breathing on room air. Clear to auscultation bilaterally; no wheezes, crackles. Abdomen: Bowel sounds present and normoactive bilaterally. Soft, nondistended.  Tender to minimal palpation diffusely throughout abdomen, no rebound.  Site of IR intervention on right abdomen with some old serosanguineous drainage through Band-Aid, no active bleeding or drainage through Band-Aid. Extremities: Skin warm, dry. No bilateral lower extremity edema.  Laboratory: Most recent CBC Lab Results  Component Value Date   WBC 14.2 (H) 09/05/2024   HGB 9.6 (  L) 09/05/2024   HCT 28.5 (L) 09/05/2024   MCV 95.6 09/05/2024   PLT 285 09/05/2024   Most recent BMP    Latest Ref Rng & Units 09/05/2024    7:11 AM  BMP  Glucose 70  - 99 mg/dL 871   BUN 6 - 20 mg/dL <5   Creatinine 9.38 - 1.24 mg/dL 9.45   Sodium 864 - 854 mmol/L 126   Potassium 3.5 - 5.1 mmol/L 4.0   Chloride 98 - 111 mmol/L 94   CO2 22 - 32 mmol/L 22   Calcium 8.9 - 10.3 mg/dL 7.7   Mag: 1.8   [name], MD 09/05/2024, 12:15 PM  PGY-1, [facility] FPTS Intern pager: [phone], text pages welcome Secure chat group [facility] Teaching Service

## 2024-09-05 NOTE — Assessment & Plan Note (Signed)
 Stable elevated liver enzymes. - Hydroxyzine  25 mg TID prn for anxiety - Thiamine  100 mg daily - Folic acid 1 mg daily

## 2024-09-05 NOTE — Assessment & Plan Note (Addendum)
 GERD: Protonix  40 mg daily DMT2: Holding home Lantus 5 units daily, last A1c 8.0; on sSSI in hospital, checking CBGs TID with meals and at bedtime Insomnia: Discontinue Melatonin; start PO benadryl 25 mg daily at bedtime; will discontinue atarax 

## 2024-09-05 NOTE — Assessment & Plan Note (Signed)
 Hgb stable at 9.6 today from 9.4 yesterday. - Continue to monitor - Transfusion goal of > 7.5

## 2024-09-05 NOTE — Progress Notes (Signed)
 Progress Note   Subjective  Patient feels about the same - continues to have pain but fevers have resolved. Fluid aspirated yesterday - no abscess, culture pending. He states he is not getting better but at the same time asking when he can go home.   Objective   Vital signs in last 24 hours: Temp:  [98.4 F (36.9 C)-99.9 F (37.7 C)] 98.8 F (37.1 C) (11/13 0813) Pulse Rate:  [82-99] 82 (11/13 0813) Resp:  [14-23] 14 (11/13 0813) BP: (104-140)/(68-99) 104/68 (11/13 0813) SpO2:  [95 %-99 %] 95 % (11/13 0813) Last BM Date : 09/03/24 General:    white male in NAD Neurologic:  Alert and oriented,  grossly normal neurologically. Psych:  Cooperative. Normal mood and affect.  Intake/Output from previous day: 11/12 0701 - 11/13 0700 In: 1294.6 [I.V.:994.4; IV Piggyback:300.2] Out: 1000 [Urine:1000] Intake/Output this shift: No intake/output data recorded.  Lab Results: Recent Labs    09/03/24 1347 09/04/24 0323 09/05/24 0711  WBC 16.7* 13.7* 14.2*  HGB 10.3* 9.4* 9.6*  HCT 30.5* 28.6* 28.5*  PLT 350 285 285   BMET Recent Labs    09/03/24 0210 09/04/24 0323 09/05/24 0711  NA 130* 131* 126*  K 4.4 4.0 4.0  CL 98 95* 94*  CO2 20* 24 22  GLUCOSE 150* 99 128*  BUN <5* <5* <5*  CREATININE 0.58* 0.62 0.54*  CALCIUM 7.7* 7.8* 7.7*   LFT Recent Labs    09/05/24 0711  PROT 5.1*  ALBUMIN 1.7*  AST 60*  ALT 21  ALKPHOS 199*  BILITOT 1.1   PT/INR Recent Labs    09/03/24 0210  LABPROT 16.0*  INR 1.2    Studies/Results: No results found.     Assessment / Plan:    38 y/o male here with the following:  Pancreatitis - likely EtOH related Peripancreatic fluid collections Fever Elevated liver enzymes - improving, suspect resolving EtOH hepatitis   Alcoholic pancreatitis + suspected component of alcoholic hepatitis which is resolving.  He has had enlarging peripancreatic fluid collections on MRCP.  He developed fever during his hospital course, IR  was consulted and aspirated fluid collection yesterday, no evidence of abscess, cultures pending.  He is on meropenem.  He feels about the same, he is tolerating a clear liquid diet without vomiting.  He is asking if he can advance his diet to full liquids.  If he feels he can tolerate that we can do so.  I did discuss his case with my advanced endoscopy colleagues and these fluid collections are not mature enough for endoscopic drainage. This will likely be a prolonged course, these fluid collections will take some time to resolve and improve.  It is critically important he abstain from all alcohol.  While he states he is not really improving, he is also asking to go home as soon as he can.  He is worried about his employment and losing his job if he does not return.  Fortunately his fever curve has improved.  We discussed goals for discharge are that he is off all IV pain medication and tolerating a diet.  If he feels he can do that then we can discharge him in the upcoming days, he is still requiring IV pain medications.  We discussed this for a bit and he understands.  I recommend he stay today, make sure his fever curve has resolved, await pending cultures, and see if he tolerates advancement of his diet to full liquids.  He  will try to get by without any IV pain medications today.  We again discussed that this is going to be a prolonged course and will take time to improve and requires alcohol abstinence and he seems to understand.  Will reassess him tomorrow, call with questions or concerns in the interim.  He will need follow-up EGD plus minus EUS or MRCP as outpatient once through this acute course  PLAN: - full liquid diet - low fat - he will try to wean off IV pain medication and manage with oral regimen - await culture data - continue antibiotics, monitor for fevers - encourage ambulation - complete alcohol abstinence - as outpatient will need f/u EGD and EUS / MRCP   Marcey Naval, MD [facility] Gastroenterology

## 2024-09-06 DIAGNOSIS — K859 Acute pancreatitis without necrosis or infection, unspecified: Secondary | ICD-10-CM | POA: Diagnosis not present

## 2024-09-06 DIAGNOSIS — B379 Candidiasis, unspecified: Secondary | ICD-10-CM

## 2024-09-06 DIAGNOSIS — K863 Pseudocyst of pancreas: Secondary | ICD-10-CM

## 2024-09-06 DIAGNOSIS — K861 Other chronic pancreatitis: Secondary | ICD-10-CM | POA: Diagnosis not present

## 2024-09-06 DIAGNOSIS — K8689 Other specified diseases of pancreas: Secondary | ICD-10-CM | POA: Diagnosis not present

## 2024-09-06 LAB — BASIC METABOLIC PANEL WITH GFR
Anion gap: 10 (ref 5–15)
BUN: <5 mg/dL — ABNORMAL LOW (ref 6–20)
CO2: 25 mmol/L (ref 22–32)
Calcium: 8.1 mg/dL — ABNORMAL LOW (ref 8.9–10.3)
Chloride: 95 mmol/L — ABNORMAL LOW (ref 98–111)
Creatinine, Ser: 0.6 mg/dL — ABNORMAL LOW (ref 0.61–1.24)
GFR, Estimated: >60 mL/min (ref >60)
Glucose, Bld: 90 mg/dL (ref 70–99)
Potassium: 3.6 mmol/L (ref 3.5–5.1)
Sodium: 130 mmol/L — ABNORMAL LOW (ref 135–145)

## 2024-09-06 LAB — MAGNESIUM: Magnesium: 1.9 mg/dL (ref 1.7–2.4)

## 2024-09-06 LAB — GLUCOSE, CAPILLARY
Glucose-Capillary: 127 mg/dL — ABNORMAL HIGH (ref 70–99)
Glucose-Capillary: 126 mg/dL — ABNORMAL HIGH (ref 70–99)
Glucose-Capillary: 137 mg/dL — ABNORMAL HIGH (ref 70–99)
Glucose-Capillary: 158 mg/dL — ABNORMAL HIGH (ref 70–99)

## 2024-09-06 LAB — CBC
HCT: 34 % — ABNORMAL LOW (ref 39.0–52.0)
Hemoglobin: 11.3 g/dL — ABNORMAL LOW (ref 13.0–17.0)
MCH: 31.8 pg (ref 26.0–34.0)
MCHC: 33.2 g/dL (ref 30.0–36.0)
MCV: 95.8 fL (ref 80.0–100.0)
Platelets: 357 K/uL (ref 150–400)
RBC: 3.55 MIL/uL — ABNORMAL LOW (ref 4.22–5.81)
RDW: 12.6 % (ref 11.5–15.5)
WBC: 15.7 K/uL — ABNORMAL HIGH (ref 4.0–10.5)
nRBC: 0 % (ref 0.0–0.2)

## 2024-09-06 MED ORDER — POTASSIUM CHLORIDE 20 MEQ PO PACK
40.0000 meq | PACK | Freq: Once | ORAL | Status: AC
  Administered 2024-09-06: 40 meq via ORAL
  Filled 2024-09-06: qty 2

## 2024-09-06 MED ORDER — OXYCODONE HCL 5 MG PO TABS
10.0000 mg | ORAL_TABLET | Freq: Four times a day (QID) | ORAL | Status: DC
Start: 2024-09-06 — End: 2024-09-08
  Administered 2024-09-06 – 2024-09-08 (×8): 10 mg via ORAL
  Filled 2024-09-06 (×8): qty 2

## 2024-09-06 MED ORDER — ONDANSETRON 4 MG PO TBDP: 4.0000 mg | ORAL_TABLET | Freq: Three times a day (TID) | ORAL | Status: DC | PRN

## 2024-09-06 MED ORDER — SODIUM CHLORIDE 0.9 % IV SOLN
100.0000 mg | INTRAVENOUS | Status: DC
  Administered 2024-09-06 – 2024-09-07 (×2): 100 mg via INTRAVENOUS
  Filled 2024-09-06 (×3): qty 5

## 2024-09-06 NOTE — Assessment & Plan Note (Addendum)
-   GI consulted, appreciate recs - Abx: IV meropenem 1 g q8h (11/12- ) - s/p IV Zosyn (11/11-11/12) - Pain: Oxycodone 10 mg q4h while awake, Tylenol  650 mg QID -> will space oxy to 10 mg q6h while awake - If increased pain with spaced oxy, can return to q4h at a reduced dose of 7.5 mg - Nausea: Zofran  ODT 4 mg q8h scheduled, phenergan 6.25 mg IV q6h prn -> discontinue phenergan - Diet: tolerating Full Liquid Diet, advance per GI recs - AM Labs: AM CMP, Mag, Phos, CBC - Repleting electrolytes as needed; repleting K with 40 mEq po today - Blood cultures: No growth at 3 days - Pseudocyst culture: Few yeast; reincubating for better growth - Will consult with ID for further recs - Strict Is/Os - TOC consulted to help with medication assistance given patient uninsured

## 2024-09-06 NOTE — Assessment & Plan Note (Addendum)
 Liver enzymes have been slightly increased, improved overall. - Thiamine  100 mg daily - Folic acid 1 mg daily

## 2024-09-06 NOTE — TOC Progression Note (Signed)
 Transition of Care [facility]) - Progression Note    Patient Details  Name: Arthuro Canelo MRN: [id number] Date of Birth: [birth year]  [facility]) CM/SW Contact  Montie LOISE Louder, [place] Phone Number: 09/06/2024, 11:14 AM  Clinical Narrative:     CSW consulted for substance use/counseling/education- patient declined  resources.   Montie Louder, MSW, LCSW Clinical Social Worker                      Expected Discharge Plan and Services                                               Social Drivers of Health (SDOH) Interventions SDOH Screenings   Food Insecurity: No Food Insecurity (09/02/2024)  Housing: Low Risk  (09/02/2024)  Transportation Needs: No Transportation Needs (09/02/2024)  Utilities: Not At Risk (09/02/2024)  Alcohol Screen: Low Risk  (12/13/2022)  Depression (PHQ2-9): Medium Risk (02/22/2023)  Social Connections: Unknown (11/30/2022)   Received from [facility]  Tobacco Use: Medium Risk (09/02/2024)    Readmission Risk Interventions     No data to display

## 2024-09-06 NOTE — Assessment & Plan Note (Signed)
 Hgb of 11.3, improved from 9.6 yesterday. - Continue to monitor - Transfusion goal of > 7.5

## 2024-09-06 NOTE — Progress Notes (Signed)
 Daily Progress Note Intern Pager: [phone]  Patient name: Aaron Burke Medical record number: [id number] Date of birth: 11/24/1985 Age: 38 y.o. Gender: male  Primary Care Provider: Delayne Artist PARAS, MD Consultants: GI, Gen Surg (not officially consulted, s/o), IR Code Status: Full  Pt Overview and Major Events to Date:  - 11/10: Admitted, GI and Gen Surg consulted - 11/11: MRCP - 11/12: Pseudocyst aspiration  Medical Decision Making:  Aaron Burke is a 38 y.o. male with PMH of PTSD, depression (hx suicide attempt), Alcohol use disorder, GERD, T2DM. Admitted for pancreatitis with pseudocyst s/p aspiration 11/12. Aspiration now growing few yeast, investigating further with reincubation. Have discontinued IV pain medication and advanced diet. Assessment & Plan Acute on chronic pancreatitis (HCC) Peripancreatic fluid collection - GI consulted, appreciate recs - Abx: IV meropenem 1 g q8h (11/12- ) - s/p IV Zosyn (11/11-11/12) - Pain: Oxycodone 10 mg q4h while awake, Tylenol  650 mg QID -> will space oxy to 10 mg q6h while awake - If increased pain with spaced oxy, can return to q4h at a reduced dose of 7.5 mg - Nausea: Zofran  ODT 4 mg q8h scheduled, phenergan 6.25 mg IV q6h prn -> discontinue phenergan - Diet: tolerating Full Liquid Diet, advance per GI recs - AM Labs: AM CMP, Mag, Phos, CBC - Repleting electrolytes as needed; repleting K with 40 mEq po today - Blood cultures: No growth at 3 days - Pseudocyst culture: Few yeast; reincubating for better growth - Will consult with ID for further recs - Strict Is/Os - TOC consulted to help with medication assistance given patient uninsured Acute anemia Hgb of 11.3, improved from 9.6 yesterday. - Continue to monitor - Transfusion goal of > 7.5 Alcohol use disorder Elevated liver enzymes Liver enzymes have been slightly increased, improved overall. - Thiamine  100 mg daily - Folic acid 1 mg daily Hyponatremia Na increased to  130 today from 126 yesterday, suspect dilutional. - Encourage good po intake - Continue to monitor Chronic health problem GERD: Protonix  40 mg daily DMT2: Holding home Lantus 5 units daily, last A1c 8.0; on sSSI in hospital, checking CBGs TID with meals and at bedtime Insomnia: PO benadryl 25 mg daily at bedtime; discontinued Atarax  in setting of giving benadryl (Melatonin was not helpful per patient)   FEN/GI: Full liquid diet PPx: SCDs Dispo: Home pending clinical improvement . Barriers include continuing to wean pain medications, advance diet, address infection.   Subjective:  Reports his abdominal pain is overall better, though he continues to have abdominal pain.  He tolerated his full liquid diet well yesterday with no nausea, vomiting, increased pain with this.  No other reported concerns this morning.  Objective: Temp:  [97.9 F (36.6 C)-98.8 F (37.1 C)] 98.2 F (36.8 C) (11/14 0235) Pulse Rate:  [79-88] 84 (11/14 0235) Resp:  [14-19] 19 (11/14 0235) BP: (104-126)/(68-90) 126/90 (11/14 0235) SpO2:  [95 %-100 %] 100 % (11/14 0235) Physical Exam: General: Patient lying on left side in bed resting, no acute distress. Cardiovascular: Regular rate and rhythm, no murmurs/rubs/gallops. Respiratory: Normal work of breathing on room air. Clear to auscultation bilaterally; no wheezes, crackles. Abdomen: Bowel sounds present and normoactive. Tender to minimal palpation diffusely, especially in lower quadrants; guarding, but overall soft and nondistended. Extremities: Skin warm, dry. No bilateral lower extremity edema.  Laboratory: Most recent CBC Lab Results  Component Value Date   WBC 15.7 (H) 09/06/2024   HGB 11.3 (L) 09/06/2024   HCT 34.0 (L) 09/06/2024  MCV 95.8 09/06/2024   PLT 357 09/06/2024   Most recent BMP    Latest Ref Rng & Units 09/06/2024    2:35 AM  BMP  Glucose 70 - 99 mg/dL 90   BUN 6 - 20 mg/dL <5   Creatinine 9.38 - 1.24 mg/dL 9.39   Sodium 864 - 854  mmol/L 130   Potassium 3.5 - 5.1 mmol/L 3.6   Chloride 98 - 111 mmol/L 95   CO2 22 - 32 mmol/L 25   Calcium 8.9 - 10.3 mg/dL 8.1    Mag 1.9  Imaging/Diagnostic Tests: ECG 11/13: My interpretation: Normal sinus rhythm, basically normal QTc at 453.  [name], MD 09/06/2024, 7:26 AM  PGY-1, [facility] FPTS Intern pager: [phone], text pages welcome Secure chat group [facility] Teaching Service

## 2024-09-06 NOTE — Assessment & Plan Note (Addendum)
 Na increased to 130 today from 126 yesterday, suspect dilutional. - Encourage good po intake - Continue to monitor

## 2024-09-06 NOTE — TOC Transition Note (Addendum)
 Transition of Care [facility]) - Discharge Note   Patient Details  Name: Aaron Burke MRN: [id number] Date of Birth: 30-Sep-1986  [facility] Care [facility]) CM/SW Contact:  Waddell Barnie Rama, RN Phone Number: 09/06/2024, 10:46 AM   Clinical Narrative:    For possible dc today, he is from home alone, has PCP, but no insurance, he states he has at least 20 or 40.00 with him for medications if needed.  He has no HH services at this time, he has a cane with him.  He states a friend will transport him home at dc.  He gets medications from [facility] pharmacy on [address], pta ambulatory with cane.  NCM tried to make hospital follow up , had to leave a message for office to call patient to make follow up apt.         Patient Goals and CMS Choice            Discharge Placement                       Discharge Plan and Services Additional resources added to the After Visit Summary for                                       Social Drivers of Health (SDOH) Interventions SDOH Screenings   Food Insecurity: No Food Insecurity (09/02/2024)  Housing: Low Risk  (09/02/2024)  Transportation Needs: No Transportation Needs (09/02/2024)  Utilities: Not At Risk (09/02/2024)  Alcohol Screen: Low Risk  (12/13/2022)  Depression (PHQ2-9): Medium Risk (02/22/2023)  Social Connections: Unknown (11/30/2022)   Received from [facility]  Tobacco Use: Medium Risk (09/02/2024)     Readmission Risk Interventions     No data to display

## 2024-09-06 NOTE — Consult Note (Signed)
 [facility]    Date of Admission:  09/02/2024     Total days of antibiotics 4               Reason for Consult: Pancreatitis     Referring Provider: Dr. McDiarmid Primary Care Provider: Delayne Artist PARAS, MD   ASSESSMENT:  [name] is a 38 year old Caucasian male admitted with sharp abdominal pain and found to have acute on chronic pancreatitis with pseudocyst now status post aspiration with cultures positive for Candida albicans and no organisms seen on Gram stain.    Symptoms are improving since aspiration and able to tolerate oral intake thus far.  Discussed plan of care to discontinue meropenem and start micafungin for Candida infection.  Likely will need a prolonged course of treatment.  May be able to be discharged on oral fluconazole and will continue to monitor cultures for any additional organisms. QTc reviewed and safe for fluconazole. Pain appears adequately controlled. Standard/universal precautions. Remaining medical and supportive care per [facility].   PLAN:  Stop meropenem.  Start micafungin.  Monitor cultures for any additional organisms. Standard/universal precautions Remaining medical and supportive care per Internal Medicine.    Principal Problem:   Acute on chronic pancreatitis (HCC) Active Problems:   Acute pancreatic fluid collection   Alcohol use disorder   Chronic health problem   Peripancreatic fluid collection   Elevated liver enzymes   Acute anemia   Hyponatremia    acetaminophen   650 mg Oral QID   diphenhydrAMINE  25 mg Oral QHS   folic acid  1 mg Oral Daily   insulin aspart  0-9 Units Subcutaneous TID WC   multivitamin with minerals  1 tablet Oral Daily   oxyCODONE  10 mg Oral Q6H WA   thiamine   100 mg Oral Daily     HPI: Aaron Burke is a 38 y.o. male with previous medical history of posttraumatic stress disorder, major depressive disorder, hyperlipidemia, and gastroesophageal reflux presenting to  the hospital with severe abdominal pain.  [name] presented to the ED with stabbing abdominal pain following consumption of spicy foods the night prior.  Initially afebrile with max temperature on 11/11 of 102.7 F.  Leukocytosis of 17,000.  CT abdomen/pelvis with ill-defined complex fluid collection in the right retroperitoneal space with concern for evolving pseudocysts.  MR abdomen with diffuse pancreatic and peripancreatic inflammatory changes compatible with acute pancreatitis with multiple pseudocysts and superinfection or abscess not excluded.  Underwent CT aspiration of a pancreatic pseudocyst on 09/04/2024.  Initially started on piperacillin-tazobactam and transition to meropenem.  [name] is on day 4 of antimicrobial therapy with meropenem for 3 days and has remained afebrile with stable leukocytosis with white blood cell count of 15,700.  Aspiration specimen with no organisms seen on Gram stain and culture showing Candida albicans.  Blood cultures obtained on admission without growth to date.  Symptoms have improved although continues to have pain and is currently able to tolerate oral intake.  ID has been asked to make antibiotic recommendations.  Review of Systems: Review of Systems  Constitutional:  Negative for chills, fever and weight loss.  Respiratory:  Negative for cough, shortness of breath and wheezing.   Cardiovascular:  Negative for chest pain and leg swelling.  Gastrointestinal:  Negative for abdominal pain, constipation, diarrhea, nausea and vomiting.  Skin:  Negative for rash.     Past Medical History:  Diagnosis Date   Gallstone pancreatitis 2020  GERD (gastroesophageal reflux disease)    Plaque psoriasis    Psoriatic arthritis (HCC)    Testicular torsion 2020    Social History   Tobacco Use   Smoking status: Former    Types: Cigarettes   Smokeless tobacco: Never  Vaping Use   Vaping status: Never Used  Substance Use Topics   Alcohol use: Never     Comment: light social drinker   Drug use: Never    Family History  Problem Relation Age of Onset   Hyperlipidemia Mother    Heart attack Father        x 2, total 4 stents   Bipolar disorder Paternal Uncle    Bipolar disorder Maternal Grandmother    Heart attack Paternal Grandfather    Bipolar disorder Other     Allergies  Allergen Reactions   Percocet [Oxycodone-Acetaminophen ] Other (See Comments)    makes me feel uncomfortable    OBJECTIVE: Blood pressure 124/84, pulse 80, temperature 98.2 F (36.8 C), temperature source Oral, resp. rate 20, height 5' 9 (1.753 m), weight 68 kg, SpO2 98%.  Physical Exam Constitutional:      General: He is not in acute distress.    Appearance: He is well-developed.  Cardiovascular:     Rate and Rhythm: Normal rate and regular rhythm.     Heart sounds: Normal heart sounds.  Pulmonary:     Effort: Pulmonary effort is normal.     Breath sounds: Normal breath sounds.  Skin:    General: Skin is warm and dry.  Neurological:     Mental Status: He is alert and oriented to person, place, and time.     Lab Results Lab Results  Component Value Date   WBC 15.7 (H) 09/06/2024   HGB 11.3 (L) 09/06/2024   HCT 34.0 (L) 09/06/2024   MCV 95.8 09/06/2024   PLT 357 09/06/2024    Lab Results  Component Value Date   CREATININE 0.60 (L) 09/06/2024   BUN <5 (L) 09/06/2024   NA 130 (L) 09/06/2024   K 3.6 09/06/2024   CL 95 (L) 09/06/2024   CO2 25 09/06/2024    Lab Results  Component Value Date   ALT 21 09/05/2024   AST 60 (H) 09/05/2024   ALKPHOS 199 (H) 09/05/2024   BILITOT 1.1 09/05/2024     Microbiology: Recent Results (from the past 240 hours)  Culture, blood (Routine X 2) w Reflex to ID Panel     Status: None (Preliminary result)   Collection Time: 09/03/24  1:47 PM   Specimen: BLOOD RIGHT ARM  Result Value Ref Range Status   Specimen Description BLOOD RIGHT ARM  Final   Special Requests   Final    BOTTLES DRAWN AEROBIC AND  ANAEROBIC Blood Culture adequate volume   Culture   Final    NO GROWTH 3 DAYS Performed at [facility], [address] [place], [place] [address]    Report Status PENDING  Incomplete  Culture, blood (Routine X 2) w Reflex to ID Panel     Status: None (Preliminary result)   Collection Time: 09/03/24  1:55 PM   Specimen: BLOOD RIGHT ARM  Result Value Ref Range Status   Specimen Description BLOOD RIGHT ARM  Final   Special Requests   Final    BOTTLES DRAWN AEROBIC AND ANAEROBIC Blood Culture adequate volume   Culture   Final    NO GROWTH 3 DAYS Performed at [facility], [address]  [place], [place] [address]    Report Status PENDING  Incomplete  Aerobic/Anaerobic Culture w Gram Stain (surgical/deep wound)     Status: None (Preliminary result)   Collection Time: 09/04/24  4:02 PM   Specimen: Abscess  Result Value Ref Range Status   Specimen Description ABSCESS  Final   Special Requests PANCREAS PSEUDOCYST  Final   Gram Stain   Final    FEW WBC PRESENT, PREDOMINANTLY PMN NO ORGANISMS SEEN Performed at [facility], [address], [place], [place] [address]    Culture   Final    FEW CANDIDA ALBICANS NO ANAEROBES ISOLATED; CULTURE IN PROGRESS FOR 5 DAYS    Report Status PENDING  Incomplete  Fungus Culture With Stain     Status: None (Preliminary result)   Collection Time: 09/04/24  4:02 PM  Result Value Ref Range Status   Fungus Stain Final report  Final    Comment: (NOTE) Performed At: [facility] [address] [place], [place] [id number] Jennette Shorter MD Ey:[id number]    Fungus (Mycology) Culture PENDING  Incomplete   Fungal Source ABSCESS  Final    Comment: PANCREAS PSEUDOCYST Performed at [facility], [address] [place], [place] [address]   Fungus Culture Result     Status: None   Collection Time: 09/04/24  4:02 PM  Result Value Ref Range Status   Result 1 Comment  Final    Comment: (NOTE) KOH/Calcofluor preparation:   no fungus observed. Performed At: [facility] [address] [place], [place] [id number] Jennette Shorter MD Ey:[id number]      Cathlyn July, NP [facility]  09/06/2024  5:33 PM

## 2024-09-06 NOTE — Progress Notes (Signed)
 Progress Note   Subjective  Patient is feeling much better today. Wants to advance diet and go home as soon as he can.    Objective   Vital signs in last 24 hours: Temp:  [97.9 F (36.6 C)-98.8 F (37.1 C)] 98.2 F (36.8 C) (11/14 1106) Pulse Rate:  [79-90] 80 (11/14 1106) Resp:  [15-20] 20 (11/14 1106) BP: (110-126)/(76-90) 124/84 (11/14 1106) SpO2:  [97 %-100 %] 98 % (11/14 1106) Last BM Date : 09/05/24 General:    white male in NAD Neurologic:  Alert and oriented,  grossly normal neurologically. Psych:  Cooperative. Normal mood and affect.  Intake/Output from previous day: 11/13 0701 - 11/14 0700 In: 709.9 [P.O.:360; IV Piggyback:349.9] Out: 1100 [Urine:1100] Intake/Output this shift: Total I/O In: 240 [P.O.:240] Out: 450 [Urine:450]  Lab Results: Recent Labs    09/04/24 0323 09/05/24 0711 09/06/24 0235  WBC 13.7* 14.2* 15.7*  HGB 9.4* 9.6* 11.3*  HCT 28.6* 28.5* 34.0*  PLT 285 285 357   BMET Recent Labs    09/04/24 0323 09/05/24 0711 09/06/24 0235  NA 131* 126* 130*  K 4.0 4.0 3.6  CL 95* 94* 95*  CO2 24 22 25   GLUCOSE 99 128* 90  BUN <5* <5* <5*  CREATININE 0.62 0.54* 0.60*  CALCIUM 7.8* 7.7* 8.1*   LFT Recent Labs    09/05/24 0711  PROT 5.1*  ALBUMIN 1.7*  AST 60*  ALT 21  ALKPHOS 199*  BILITOT 1.1   PT/INR No results for input(s): LABPROT, INR in the last 72 hours.  Studies/Results: CT ASPIRATION N/S Result Date: 09/06/2024 INDICATION: 10220 Abscess [id number] Briefly, 38 year old male with a history of necrotizing pancreatitis with Peri-pancreatic fluid collection. Diagnostic aspiration requested. EXAM: CT-GUIDED ASPIRATION OF PERIPANCREATIC FLUID COLLECTIONS COMPARISON:  CT AP, 09/02/2024.  MRCP, 09/03/2024. MEDICATIONS: None. ANESTHESIA/SEDATION: Moderate (conscious) sedation was employed during this procedure. A total of Versed  2 mg and Fentanyl  100 mcg was administered intravenously. Moderate Sedation Time: 13 minutes. The  patient's level of consciousness and vital signs were monitored continuously by radiology nursing throughout the procedure under my direct supervision. CONTRAST:  None. COMPLICATIONS: None immediate. PROCEDURE: RADIATION DOSE REDUCTION: This exam was performed according to the departmental dose-optimization program which includes automated exposure control, adjustment of the mA and/or kV according to patient size and/or use of iterative reconstruction technique. Informed consent was obtained from the patient following an explanation of the procedure, risks, benefits and alternatives. A time out was performed prior to the initiation of the procedure. The patient was positioned supine on the CT table and a limited CT was performed for procedural planning demonstrating peripancreatic fluid collection. The procedure was planned. The operative site was prepped and draped in the usual sterile fashion. Appropriate trajectory was confirmed with a 22 gauge spinal needle after the adjacent tissues were anesthetized with 1% Lidocaine  with epinephrine. Under intermittent CT guidance, a 17 gauge needle was advanced into the peripheral aspect of the collection. Appropriate positioning was confirmed and diagnostic aspiration was performed and samples were obtained. The co-axial needle was removed and hemostasis was achieved with manual compression. A limited postprocedural CT was negative for hemorrhage or additional complication. A dressing was placed. The patient tolerated the procedure well without immediate postprocedural complication. IMPRESSION: Successful CT guided diagnostic aspiration of peripancreatic fluid collection. 5 mL of serosanguineous fluid was submitted for microbiological analysis Thom Hall, MD [facility] Electronically Signed   By: Thom Hall HERO.D.  On: 09/06/2024 09:12       Assessment / Plan:    38 y/o male here with the  following:  Acute pancreatitis - alcohol induced Peripancreatic fluid collections Elevated liver enzymes - improved, suspected alcoholic hepatitis  Pancreatitis due to recurrent alcohol abuse, will suspected component of alcoholic hepatitis, the latter of which appears to be resolving.  Fluid collections evolving.  Fever earlier in his hospital course, empiric antibiotics started, IR aspirated one of the cysts.  No evidence of abscess.  Culture negative to date but a few Candida albicans noted, cultures not finalized.  ID consulted regarding this finding and defer to them regarding antifungal/antibiotic regimen at this point.  He is feeling better, wants to advance his diet which is a good sign.  Low-fat diet is okay with me.  Hopefully he can maintain off all IV pain meds and be discharged over the weekend if possible.  Discussed with him that this will be a prolonged course, it will take several weeks if not months for this to resolve.  It requires complete alcohol abstinence, hopefully he can comply with this, if not this will continue to worsen.  He does need follow-up EGD and also either EUS or MRCP in next 1-2 months as outpatient.  He has seen [facility] in the past, it has been a few years since he has seen them.  It is up to him where he wants to be seen for this chronically, I gave him my business card if he wants to see me I can help coordinate follow-up.  I do think he should see an advanced endoscopist/biliary specialist as he will likely need an EUS.  He will think about his options and contact me if he wants to schedule once outpatient.  Nothing new from our standpoint otherwise at this time, glad he is improving.  We will sign off, call with questions  Marcey Naval, MD [facility]

## 2024-09-06 NOTE — Assessment & Plan Note (Addendum)
 GERD: Protonix  40 mg daily DMT2: Holding home Lantus 5 units daily, last A1c 8.0; on sSSI in hospital, checking CBGs TID with meals and at bedtime Insomnia: PO benadryl 25 mg daily at bedtime; discontinued Atarax  in setting of giving benadryl (Melatonin was not helpful per patient)

## 2024-09-07 DIAGNOSIS — K861 Other chronic pancreatitis: Secondary | ICD-10-CM | POA: Diagnosis not present

## 2024-09-07 DIAGNOSIS — K863 Pseudocyst of pancreas: Secondary | ICD-10-CM | POA: Diagnosis present

## 2024-09-07 DIAGNOSIS — K859 Acute pancreatitis without necrosis or infection, unspecified: Secondary | ICD-10-CM | POA: Diagnosis not present

## 2024-09-07 LAB — CBC
HCT: 29.8 % — ABNORMAL LOW (ref 39.0–52.0)
Hemoglobin: 10 g/dL — ABNORMAL LOW (ref 13.0–17.0)
MCH: 32.1 pg (ref 26.0–34.0)
MCHC: 33.6 g/dL (ref 30.0–36.0)
MCV: 95.5 fL (ref 80.0–100.0)
Platelets: 299 K/uL (ref 150–400)
RBC: 3.12 MIL/uL — ABNORMAL LOW (ref 4.22–5.81)
RDW: 13 % (ref 11.5–15.5)
WBC: 12.6 K/uL — ABNORMAL HIGH (ref 4.0–10.5)
nRBC: 0 % (ref 0.0–0.2)

## 2024-09-07 LAB — COMPREHENSIVE METABOLIC PANEL WITH GFR
ALT: 23 U/L (ref 0–44)
AST: 55 U/L — ABNORMAL HIGH (ref 15–41)
Albumin: 1.6 g/dL — ABNORMAL LOW (ref 3.5–5.0)
Alkaline Phosphatase: 194 U/L — ABNORMAL HIGH (ref 38–126)
Anion gap: 8 (ref 5–15)
BUN: <5 mg/dL — ABNORMAL LOW (ref 6–20)
CO2: 24 mmol/L (ref 22–32)
Calcium: 7.9 mg/dL — ABNORMAL LOW (ref 8.9–10.3)
Chloride: 100 mmol/L (ref 98–111)
Creatinine, Ser: 0.46 mg/dL — ABNORMAL LOW (ref 0.61–1.24)
GFR, Estimated: >60 mL/min (ref >60)
Glucose, Bld: 111 mg/dL — ABNORMAL HIGH (ref 70–99)
Potassium: 4 mmol/L (ref 3.5–5.1)
Sodium: 132 mmol/L — ABNORMAL LOW (ref 135–145)
Total Bilirubin: 1.1 mg/dL (ref 0.0–1.2)
Total Protein: 5.1 g/dL — ABNORMAL LOW (ref 6.5–8.1)

## 2024-09-07 LAB — GLUCOSE, CAPILLARY
Glucose-Capillary: 93 mg/dL (ref 70–99)
Glucose-Capillary: 199 mg/dL — ABNORMAL HIGH (ref 70–99)
Glucose-Capillary: 135 mg/dL — ABNORMAL HIGH (ref 70–99)

## 2024-09-07 LAB — PHOSPHORUS: Phosphorus: 3.9 mg/dL (ref 2.5–4.6)

## 2024-09-07 LAB — MAGNESIUM: Magnesium: 1.6 mg/dL — ABNORMAL LOW (ref 1.7–2.4)

## 2024-09-07 MED ORDER — MAGNESIUM SULFATE 4 GM/100ML IV SOLN
4.0000 g | Freq: Once | INTRAVENOUS | Status: AC
  Administered 2024-09-07: 4 g via INTRAVENOUS
  Filled 2024-09-07: qty 100

## 2024-09-07 NOTE — Progress Notes (Signed)
 ID brief note  Afebrile  WBC went up to 20.9 from 12.6 Meropenem was stopped on 11/14, noted   Will change micafungin to fluconazole as highly sensitive. Have asked micro to run S.   Will wait for cultures to finalize as well as WBC trend before final recs.  Dr Overton to follow starting 11/17  Annalee Orem, MD Infectious Disease Physician [facility] [address] [place], [place] [address] Phone: [phone]  Fax: [phone]

## 2024-09-07 NOTE — Assessment & Plan Note (Signed)
 Liver enzymes have been slightly increased, improved overall. - Thiamine  100 mg daily - Folic acid 1 mg daily

## 2024-09-07 NOTE — Progress Notes (Addendum)
 Daily Progress Note Intern Pager: [phone]  Patient name: Aaron Burke Medical record number: [id number] Date of birth: 04-04-1986 Age: 38 y.o. Gender: male  Primary Care Provider: Delayne Artist PARAS, MD Consultants: GI, Gen Surg (not officially consulted, s/o), IR Code Status: Full  Pt Overview and Major Events to Date:  - 11/10: Admitted, GI and Gen Surg consulted - 11/11: MRCP - 11/12: Pseudocyst aspiration  Medical Decision Making:  Aaron Burke is a 38 y.o. male with PMH of PTSD, depression (hx suicide attempt), Alcohol use disorder, GERD, T2DM. Admitted for pancreatitis with pseudocyst s/p aspiration 11/12. Aspiration now growing few yeast, investigating further with reincubation. Have discontinued IV pain medication and advanced diet. Assessment & Plan Acute on chronic pancreatitis (HCC) Peripancreatic fluid collection - GI consulted, appreciate recs - Abx: IV meropenem 1 g q8h (11/12- ) - s/p IV Zosyn (11/11-11/12) - Pancreas pseudocyst (abscess ) sample on 11/12 culture show few candida to date  - Starting Micafungin 100 mg IV Q24H ( 11/14 - ?)   - Re incubating for better growth - Will consult ID for further recs - Blood cultures: No growth at 3 days - Pain: Oxycodone 10 mg q4h while awake, Tylenol  650 mg QID  - If increased pain with spaced oxy, can return to q4h at a reduced dose of 7.5 mg - Nausea: Zofran  ODT 4 mg q8h scheduled - Diet: tolerating Heart healthy diet - AM Labs: AM CMP, Mag, Phos, CBC - Repleting electrolytes as needed; repleting K with 40 mEq po today - Strict Is/Os - TOC consulted to help with medication assistance given patient uninsured Acute anemia Hgb of 11.3, improved from 9.6 yesterday. - Continue to monitor - Transfusion goal of > 7.5 Alcohol use disorder Elevated liver enzymes Liver enzymes have been slightly increased, improved overall. - Thiamine  100 mg daily - Folic acid 1 mg daily Hyponatremia Na increased to 130 today from  126 yesterday, suspect dilutional. - Encourage good po intake - Continue to monitor Chronic health problem GERD: Protonix  40 mg daily DMT2: Holding home Lantus 5 units daily, last A1c 8.0; on sSSI in hospital, checking CBGs TID with meals and at bedtime Insomnia: PO benadryl 25 mg daily at bedtime; discontinued Atarax  in setting of giving benadryl (Melatonin was not helpful per patient)   FEN/GI: Full liquid diet PPx: SCDs Dispo: Home pending clinical improvement . Barriers include continuing to wean pain medications, advance diet, address infection.   Subjective:  NAEON, tolerated diet. Pain improved WBC improved from previously Had BM this AM  Objective: Temp:  [98.1 F (36.7 C)-98.7 F (37.1 C)] 98.5 F (36.9 C) (11/15 0352) Pulse Rate:  [76-93] 76 (11/15 0352) Resp:  [15-20] 18 (11/15 0352) BP: (114-133)/(75-95) 120/75 (11/15 0352) SpO2:  [95 %-98 %] 95 % (11/15 0352) Physical Exam: General: Patient lying on left side in bed resting, no acute distress. Cardiovascular: Regular rate and rhythm, no murmurs/rubs/gallops. Respiratory: Normal work of breathing on room air. Clear to auscultation bilaterally; no wheezes, crackles. Abdomen: Bowel sounds present and normoactive. Tender to minimal palpation diffusely, especially in lower quadrants; guarding, but overall soft and nondistended. Extremities: Skin warm, dry. No bilateral lower extremity edema.  Laboratory: Most recent CBC Lab Results  Component Value Date   WBC 12.6 (H) 09/07/2024   HGB 10.0 (L) 09/07/2024   HCT 29.8 (L) 09/07/2024   MCV 95.5 09/07/2024   PLT 299 09/07/2024   Most recent BMP    Latest Ref Rng & Units 09/07/2024  2:57 AM  BMP  Glucose 70 - 99 mg/dL 888   BUN 6 - 20 mg/dL <5   Creatinine 9.38 - 1.24 mg/dL 9.53   Sodium 864 - 854 mmol/L 132   Potassium 3.5 - 5.1 mmol/L 4.0   Chloride 98 - 111 mmol/L 100   CO2 22 - 32 mmol/L 24   Calcium 8.9 - 10.3 mg/dL 7.9    Mag 1.9  Imaging/Diagnostic  Tests: ECG 11/13: My interpretation: Normal sinus rhythm, basically normal QTc at 453.  Suzen Houston NOVAK, DO 09/07/2024, 6:28 AM  PGY-1, [facility] FPTS Intern pager: [phone], text pages welcome Secure chat group [facility] Teaching Service

## 2024-09-07 NOTE — Assessment & Plan Note (Addendum)
-   GI consulted, appreciate recs - Abx: IV meropenem 1 g q8h (11/12- ) - s/p IV Zosyn (11/11-11/12) - Pancreas pseudocyst (abscess ) sample on 11/12 culture show few candida to date  - Starting Micafungin 100 mg IV Q24H ( 11/14 - ?)   - Re incubating for better growth - Will consult ID for further recs - Blood cultures: No growth at 3 days - Pain: Oxycodone 10 mg q4h while awake, Tylenol  650 mg QID  - If increased pain with spaced oxy, can return to q4h at a reduced dose of 7.5 mg - Nausea: Zofran  ODT 4 mg q8h scheduled - Diet: tolerating Heart healthy diet - AM Labs: AM CMP, Mag, Phos, CBC - Repleting electrolytes as needed; repleting K with 40 mEq po today - Strict Is/Os - TOC consulted to help with medication assistance given patient uninsured

## 2024-09-07 NOTE — Assessment & Plan Note (Signed)
 Na increased to 130 today from 126 yesterday, suspect dilutional. - Encourage good po intake - Continue to monitor

## 2024-09-07 NOTE — Assessment & Plan Note (Addendum)
 Hgb of 11.3, improved from 9.6 yesterday. - Continue to monitor - Transfusion goal of > 7.5

## 2024-09-07 NOTE — Progress Notes (Signed)
 Injection Mycamine paused due to loss of iv access ,attempted to start new iv unsuccessful,iv consultation placed,night shift RN made aware to continue the infusion once iv placed

## 2024-09-07 NOTE — Progress Notes (Signed)
 This RN was called to the room by IV team RN, who said patient was refusing restart. The patient complained that he is not getting enough sleep and doesn't feel good. The patient is irritated and using profanity. This RN explained that it is his right to refuse any care however this IV medication is a very important part of his treatment plan. Patient has agreed to placement of a new IV however he is requesting to be allowed to sleep. I have instructed the NT to skip his midnight vitals and CBG to allow patient to sleep until 6 am.

## 2024-09-07 NOTE — Assessment & Plan Note (Signed)
 GERD: Protonix  40 mg daily DMT2: Holding home Lantus 5 units daily, last A1c 8.0; on sSSI in hospital, checking CBGs TID with meals and at bedtime Insomnia: PO benadryl 25 mg daily at bedtime; discontinued Atarax  in setting of giving benadryl (Melatonin was not helpful per patient)

## 2024-09-08 DIAGNOSIS — K863 Pseudocyst of pancreas: Secondary | ICD-10-CM | POA: Diagnosis not present

## 2024-09-08 DIAGNOSIS — K859 Acute pancreatitis without necrosis or infection, unspecified: Secondary | ICD-10-CM | POA: Diagnosis not present

## 2024-09-08 DIAGNOSIS — K861 Other chronic pancreatitis: Secondary | ICD-10-CM | POA: Diagnosis not present

## 2024-09-08 LAB — COMPREHENSIVE METABOLIC PANEL WITH GFR
ALT: 21 U/L (ref 0–44)
AST: 56 U/L — ABNORMAL HIGH (ref 15–41)
Albumin: 1.8 g/dL — ABNORMAL LOW (ref 3.5–5.0)
Alkaline Phosphatase: 206 U/L — ABNORMAL HIGH (ref 38–126)
Anion gap: 11 (ref 5–15)
BUN: <5 mg/dL — ABNORMAL LOW (ref 6–20)
CO2: 23 mmol/L (ref 22–32)
Calcium: 8 mg/dL — ABNORMAL LOW (ref 8.9–10.3)
Chloride: 97 mmol/L — ABNORMAL LOW (ref 98–111)
Creatinine, Ser: 0.55 mg/dL — ABNORMAL LOW (ref 0.61–1.24)
GFR, Estimated: >60 mL/min (ref >60)
Glucose, Bld: 98 mg/dL (ref 70–99)
Potassium: 4.2 mmol/L (ref 3.5–5.1)
Sodium: 131 mmol/L — ABNORMAL LOW (ref 135–145)
Total Bilirubin: 1 mg/dL (ref 0.0–1.2)
Total Protein: 5.5 g/dL — ABNORMAL LOW (ref 6.5–8.1)

## 2024-09-08 LAB — CBC
HCT: 31.7 % — ABNORMAL LOW (ref 39.0–52.0)
Hemoglobin: 10.7 g/dL — ABNORMAL LOW (ref 13.0–17.0)
MCH: 32 pg (ref 26.0–34.0)
MCHC: 33.8 g/dL (ref 30.0–36.0)
MCV: 94.9 fL (ref 80.0–100.0)
Platelets: 368 K/uL (ref 150–400)
RBC: 3.34 MIL/uL — ABNORMAL LOW (ref 4.22–5.81)
RDW: 13.2 % (ref 11.5–15.5)
WBC: 20.9 K/uL — ABNORMAL HIGH (ref 4.0–10.5)
nRBC: 0 % (ref 0.0–0.2)

## 2024-09-08 LAB — MAGNESIUM: Magnesium: 1.7 mg/dL (ref 1.7–2.4)

## 2024-09-08 LAB — CULTURE, BLOOD (ROUTINE X 2)
Culture: NO GROWTH
Culture: NO GROWTH
Special Requests: ADEQUATE
Special Requests: ADEQUATE

## 2024-09-08 LAB — GLUCOSE, CAPILLARY
Glucose-Capillary: 134 mg/dL — ABNORMAL HIGH (ref 70–99)
Glucose-Capillary: 123 mg/dL — ABNORMAL HIGH (ref 70–99)
Glucose-Capillary: 104 mg/dL — ABNORMAL HIGH (ref 70–99)
Glucose-Capillary: 125 mg/dL — ABNORMAL HIGH (ref 70–99)
Glucose-Capillary: 125 mg/dL — ABNORMAL HIGH (ref 70–99)

## 2024-09-08 LAB — PHOSPHORUS: Phosphorus: 4.1 mg/dL (ref 2.5–4.6)

## 2024-09-08 MED ORDER — HYDROXYZINE HCL 25 MG PO TABS
25.0000 mg | ORAL_TABLET | Freq: Once | ORAL | Status: AC
  Administered 2024-09-08: 25 mg via ORAL
  Filled 2024-09-08: qty 1

## 2024-09-08 MED ORDER — FLUCONAZOLE IN SODIUM CHLORIDE 400-0.9 MG/200ML-% IV SOLN
800.0000 mg | INTRAVENOUS | Status: DC
  Administered 2024-09-08: 800 mg via INTRAVENOUS
  Filled 2024-09-08 (×3): qty 400

## 2024-09-08 MED ORDER — OXYCODONE HCL 5 MG PO TABS
5.0000 mg | ORAL_TABLET | Freq: Four times a day (QID) | ORAL | Status: DC
  Administered 2024-09-08 – 2024-09-09 (×3): 5 mg via ORAL
  Filled 2024-09-08 (×3): qty 1

## 2024-09-08 MED ORDER — DIPHENHYDRAMINE HCL 25 MG PO CAPS
25.0000 mg | ORAL_CAPSULE | Freq: Every day | ORAL | Status: DC
  Administered 2024-09-08: 25 mg via ORAL
  Filled 2024-09-08: qty 1

## 2024-09-08 NOTE — Assessment & Plan Note (Addendum)
-   GI consulted, appreciate recs - ID consulted, appreciate assistance with treatment regimen - Abx: IV Micafungin 100 mg daily (11/14- ) - s/p IV meropenem 1 g q8h (11/12-11/14) - s/p IV Zosyn (11/11-11/12) - Blood cultures: No growth 5 days, complete - Fungal cultures: Pending - Pain: Oxycodone 10 mg q6h while awake, Tylenol  650 mg QID -> Decrease oxycodone to 5 mg q6h while awake - Nausea: Zofran  ODT 4 mg q8h PRN - Diet: tolerating Heart healthy diet - Labs: AM CMP, Mag, Phos, CBC - Repleting electrolytes as needed - Strict Is/Os - TOC to help with medication assistance given patient uninsured - Transition level of care from progressive to med-surg

## 2024-09-08 NOTE — Progress Notes (Signed)
 Daily Progress Note Intern Pager: [phone]  Patient name: Aaron Burke Medical record number: [id number] Date of birth: 11-03-1985 Age: 38 y.o. Gender: male  Primary Care Provider: Delayne Artist PARAS, MD Consultants: GI, Gen Surg (not officially consulted, s/o), IR, ID Code Status: Full  Pt Overview and Major Events to Date:  - 11/10: Admitted, GI consulted - 11/11: MRCP - 11/12: Pseudocyst aspiration - 11/14: ID consulted  Medical Decision Making:  Aaron Burke is a 38 y.o. male with PMH of PTSD, depression (hx suicide attempt), Alcohol use disorder, GERD, T2DM. Admitted for pancreatitis with pseudocyst s/p aspiration 11/12. Aspiration growing candida albicans, per ID recs receiving treatment with Micafungin.  WBC elevated from 12.6 yesterday to 20.9 today. Reached out to ID to clarify antibiotic/antifungal treatment, and Dr. Dea recommended continuing current treatment with micafungin for now pending the return of fungal cultures. Assessment & Plan Acute on chronic pancreatitis (HCC) Peripancreatic fluid collection - GI consulted, appreciate recs - ID consulted, appreciate assistance with treatment regimen - Abx: IV Micafungin 100 mg daily (11/14- ) - s/p IV meropenem 1 g q8h (11/12-11/14) - s/p IV Zosyn (11/11-11/12) - Blood cultures: No growth 5 days, complete - Fungal cultures: Pending - Pain: Oxycodone 10 mg q6h while awake, Tylenol  650 mg QID -> Decrease oxycodone to 5 mg q6h while awake - Nausea: Zofran  ODT 4 mg q8h PRN - Diet: tolerating Heart healthy diet - Labs: AM CMP, Mag, Phos, CBC - Repleting electrolytes as needed - Strict Is/Os - TOC to help with medication assistance given patient uninsured - Transition level of care from progressive to med-surg Alcohol use disorder Elevated liver enzymes Liver enzymes remain improved from admission, slightly increased (AST: ALT 2:1). - Thiamine  100 mg daily - Folic acid 1 mg daily Acute anemia Stable. -  Continue to monitor - Transfusion goal of > 7.5 Hyponatremia Stable. - Encourage good po intake - Continue to monitor Chronic health problem GERD: Protonix  40 mg daily DMT2: Holding home Lantus 5 units daily, last A1c 8.0; on sSSI in hospital, checking CBGs TID with meals and at bedtime Insomnia: Schedule PO benadryl 25 mg daily at bedtime (Melatonin was not helpful per patient)   FEN/GI: Heart healthy PPx: SCDs Dispo:Home pending clinical improvement . Barriers include continuing to wean pain meds, need for transition from IV to PO abx.   Subjective:  Patient reports he is feeling okay overall.  Continues to report improved pain in his abdomen.  Has been tolerating current diet fine without nausea or vomiting.  Did have increased anxiety earlier this morning and was given as needed Atarax , current resolution of symptoms at time of interview.  Objective: Temp:  [98.5 F (36.9 C)-98.8 F (37.1 C)] 98.6 F (37 C) (11/16 0812) Pulse Rate:  [77-95] 92 (11/16 0812) Resp:  [18-20] 20 (11/16 0812) BP: (110-142)/(72-88) 117/81 (11/16 0812) SpO2:  [95 %-99 %] 95 % (11/16 0812) Weight:  [884 kg] 115 kg (11/16 0700) Physical Exam: General: Lying on side in bed, no acute distress. Cardiovascular: Regular rate and rhythm, no murmurs/rubs/gallops. Respiratory: Normal work of breathing on room air. Clear to auscultation bilaterally; no wheezes, crackles. Abdomen: Bowel sounds present and normoactive bilaterally. Soft, nondistended, nontender. Extremities: Skin warm, dry. No bilateral lower extremity edema.  Laboratory: Most recent CBC Lab Results  Component Value Date   WBC 20.9 (H) 09/08/2024   HGB 10.7 (L) 09/08/2024   HCT 31.7 (L) 09/08/2024   MCV 94.9 09/08/2024   PLT 368 09/08/2024  Most recent BMP    Latest Ref Rng & Units 09/08/2024    8:24 AM  BMP  Glucose 70 - 99 mg/dL 98   BUN 6 - 20 mg/dL <5   Creatinine 9.38 - 1.24 mg/dL 9.44   Sodium 864 - 854 mmol/L 131    Potassium 3.5 - 5.1 mmol/L 4.2   Chloride 98 - 111 mmol/L 97   CO2 22 - 32 mmol/L 23   Calcium 8.9 - 10.3 mg/dL 8.0     [name], MD 09/08/2024, 10:54 AM  PGY-1, [facility] FPTS Intern pager: [phone], text pages welcome Secure chat group [name] Teaching Service

## 2024-09-08 NOTE — Assessment & Plan Note (Signed)
 Stable. - Encourage good po intake - Continue to monitor

## 2024-09-08 NOTE — Plan of Care (Signed)

## 2024-09-08 NOTE — Assessment & Plan Note (Signed)
 Liver enzymes remain improved from admission, slightly increased (AST: ALT 2:1). - Thiamine  100 mg daily - Folic acid 1 mg daily

## 2024-09-08 NOTE — Assessment & Plan Note (Signed)
 Stable. - Continue to monitor - Transfusion goal of > 7.5

## 2024-09-08 NOTE — Assessment & Plan Note (Addendum)
 GERD: Protonix  40 mg daily DMT2: Holding home Lantus 5 units daily, last A1c 8.0; on sSSI in hospital, checking CBGs TID with meals and at bedtime Insomnia: Schedule PO benadryl 25 mg daily at bedtime (Melatonin was not helpful per patient)

## 2024-09-08 NOTE — Progress Notes (Signed)
 Pharmacy Antibiotic Note  Aaron Burke is a 38 y.o. male admitted on 09/02/2024 with pancreatic abscess with Candida albicans. Transitioning from micafungin to fluconazole. WBC count increased today from 12.6 to 20.9 and afebrile, will continue to monitor. Pharmacy has been consulted for fluconazole dosing.  Plan: -Initiate fluconazole 800 mg IV q24h -Discontinue micafungin -F/u abscess culture sensitivities, LOT, WBC, fever trend -F/u transition to PO fluconazole  Height: 5' 9 (175.3 cm) Weight: 115 kg (253 lb 9.6 oz) IBW/kg (Calculated) : 70.7  Temp (24hrs), Avg:98.6 F (37 C), Min:98.5 F (36.9 C), Max:98.8 F (37.1 C)  Recent Labs  Lab 09/04/24 0323 09/05/24 0711 09/06/24 0235 09/07/24 0257 09/08/24 0824  WBC 13.7* 14.2* 15.7* 12.6* 20.9*  CREATININE 0.62 0.54* 0.60* 0.46* 0.55*    Estimated Creatinine Clearance: 153.5 mL/min (A) (by C-G formula based on SCr of 0.55 mg/dL (L)).    Allergies  Allergen Reactions   Percocet [Oxycodone-Acetaminophen ] Other (See Comments)    makes me feel uncomfortable    Antimicrobials this admission: Merrem 11/12 > 11/14 Zosyn 11/11 > 11/12 Micafungin 11/14 > 11/15 Fluconazole 11/16 >>  Microbiology results: 11/11 BCx: NGF 11/12 pancreatic fluid cx: few candida albicans  Thank you for allowing pharmacy to be a part of this patient's care.  Izetta Carl, PharmD PGY1 Pharmacy Resident

## 2024-09-09 ENCOUNTER — Other Ambulatory Visit (HOSPITAL_COMMUNITY): Payer: Self-pay

## 2024-09-09 DIAGNOSIS — K859 Acute pancreatitis without necrosis or infection, unspecified: Secondary | ICD-10-CM | POA: Diagnosis not present

## 2024-09-09 DIAGNOSIS — K861 Other chronic pancreatitis: Secondary | ICD-10-CM | POA: Diagnosis not present

## 2024-09-09 DIAGNOSIS — K863 Pseudocyst of pancreas: Secondary | ICD-10-CM | POA: Diagnosis not present

## 2024-09-09 LAB — COMPREHENSIVE METABOLIC PANEL WITH GFR
ALT: 18 U/L (ref 0–44)
AST: 41 U/L (ref 15–41)
Albumin: 1.7 g/dL — ABNORMAL LOW (ref 3.5–5.0)
Alkaline Phosphatase: 224 U/L — ABNORMAL HIGH (ref 38–126)
Anion gap: 11 (ref 5–15)
BUN: <5 mg/dL — ABNORMAL LOW (ref 6–20)
CO2: 24 mmol/L (ref 22–32)
Calcium: 8.2 mg/dL — ABNORMAL LOW (ref 8.9–10.3)
Chloride: 100 mmol/L (ref 98–111)
Creatinine, Ser: 0.49 mg/dL — ABNORMAL LOW (ref 0.61–1.24)
GFR, Estimated: >60 mL/min (ref >60)
Glucose, Bld: 106 mg/dL — ABNORMAL HIGH (ref 70–99)
Potassium: 4.1 mmol/L (ref 3.5–5.1)
Sodium: 135 mmol/L (ref 135–145)
Total Bilirubin: 1.1 mg/dL (ref 0.0–1.2)
Total Protein: 5.6 g/dL — ABNORMAL LOW (ref 6.5–8.1)

## 2024-09-09 LAB — CBC
HCT: 32.1 % — ABNORMAL LOW (ref 39.0–52.0)
Hemoglobin: 10.7 g/dL — ABNORMAL LOW (ref 13.0–17.0)
MCH: 31.7 pg (ref 26.0–34.0)
MCHC: 33.3 g/dL (ref 30.0–36.0)
MCV: 95 fL (ref 80.0–100.0)
Platelets: 419 K/uL — ABNORMAL HIGH (ref 150–400)
RBC: 3.38 MIL/uL — ABNORMAL LOW (ref 4.22–5.81)
RDW: 13.4 % (ref 11.5–15.5)
WBC: 18.9 K/uL — ABNORMAL HIGH (ref 4.0–10.5)
nRBC: 0 % (ref 0.0–0.2)

## 2024-09-09 LAB — MAGNESIUM: Magnesium: 1.5 mg/dL — ABNORMAL LOW (ref 1.7–2.4)

## 2024-09-09 LAB — GLUCOSE, CAPILLARY
Glucose-Capillary: 116 mg/dL — ABNORMAL HIGH (ref 70–99)
Glucose-Capillary: 129 mg/dL — ABNORMAL HIGH (ref 70–99)

## 2024-09-09 LAB — PHOSPHORUS: Phosphorus: 5 mg/dL — ABNORMAL HIGH (ref 2.5–4.6)

## 2024-09-09 MED ORDER — OXYCODONE HCL 5 MG PO TABS: 5.0000 mg | ORAL_TABLET | Freq: Three times a day (TID) | ORAL | 0 refills | Status: DC | PRN

## 2024-09-09 MED ORDER — AMOXICILLIN-POT CLAVULANATE 875-125 MG PO TABS
1.0000 | ORAL_TABLET | Freq: Two times a day (BID) | ORAL | 0 refills | Status: AC
  Filled 2024-09-09: qty 55, 28d supply, fill #0

## 2024-09-09 MED ORDER — THIAMINE HCL 100 MG PO TABS
100.0000 mg | ORAL_TABLET | Freq: Every day | ORAL | 0 refills | Status: AC
  Filled 2024-09-09: qty 30, 30d supply, fill #0

## 2024-09-09 MED ORDER — HYDROXYZINE HCL 10 MG PO TABS: 10.0000 mg | ORAL_TABLET | Freq: Three times a day (TID) | ORAL | Status: DC | PRN

## 2024-09-09 MED ORDER — OXYCODONE HCL 5 MG PO TABS
5.0000 mg | ORAL_TABLET | Freq: Three times a day (TID) | ORAL | 0 refills | Status: AC | PRN
  Filled 2024-09-09: qty 15, 5d supply, fill #0

## 2024-09-09 MED ORDER — ONDANSETRON 4 MG PO TBDP: 4.0000 mg | ORAL_TABLET | Freq: Three times a day (TID) | ORAL | 0 refills | Status: DC | PRN

## 2024-09-09 MED ORDER — MAGNESIUM SULFATE 2 GM/50ML IV SOLN
2.0000 g | Freq: Once | INTRAVENOUS | Status: AC
  Administered 2024-09-09: 2 g via INTRAVENOUS
  Filled 2024-09-09: qty 50

## 2024-09-09 MED ORDER — AMOXICILLIN-POT CLAVULANATE 875-125 MG PO TABS
1.0000 | ORAL_TABLET | Freq: Two times a day (BID) | ORAL | Status: DC
  Administered 2024-09-09: 1 via ORAL
  Filled 2024-09-09: qty 1

## 2024-09-09 MED ORDER — FLUCONAZOLE 200 MG PO TABS: 400.0000 mg | ORAL_TABLET | Freq: Every day | ORAL | 0 refills | Status: DC

## 2024-09-09 MED ORDER — FLUCONAZOLE 200 MG PO TABS
400.0000 mg | ORAL_TABLET | Freq: Every day | ORAL | Status: DC
  Administered 2024-09-09: 400 mg via ORAL
  Filled 2024-09-09: qty 2

## 2024-09-09 MED ORDER — THIAMINE HCL 100 MG PO TABS: 100.0000 mg | ORAL_TABLET | Freq: Every day | ORAL | 0 refills | Status: DC

## 2024-09-09 MED ORDER — ONDANSETRON 4 MG PO TBDP
4.0000 mg | ORAL_TABLET | Freq: Three times a day (TID) | ORAL | 0 refills | Status: AC | PRN
  Filled 2024-09-09: qty 20, 7d supply, fill #0

## 2024-09-09 MED ORDER — OXYCODONE HCL 5 MG PO TABS: 5.0000 mg | ORAL_TABLET | Freq: Three times a day (TID) | ORAL | Status: DC | PRN

## 2024-09-09 MED ORDER — FLUCONAZOLE 200 MG PO TABS
400.0000 mg | ORAL_TABLET | Freq: Every day | ORAL | 0 refills | Status: AC
  Filled 2024-09-09: qty 54, 27d supply, fill #0

## 2024-09-09 MED ORDER — HYDROXYZINE HCL 10 MG PO TABS
10.0000 mg | ORAL_TABLET | Freq: Three times a day (TID) | ORAL | Status: DC | PRN
  Administered 2024-09-09: 10 mg via ORAL
  Filled 2024-09-09: qty 1

## 2024-09-09 MED ORDER — AMOXICILLIN-POT CLAVULANATE 875-125 MG PO TABS: 1.0000 | ORAL_TABLET | Freq: Two times a day (BID) | ORAL | 0 refills | Status: DC

## 2024-09-09 NOTE — Plan of Care (Signed)
  Problem: Education: Goal: Ability to describe self-care measures that may prevent or decrease complications (Diabetes Survival Skills Education) will improve Outcome: Adequate for Discharge Goal: Individualized Educational Video(s) Outcome: Adequate for Discharge   Problem: Coping: Goal: Ability to adjust to condition or change in health will improve Outcome: Adequate for Discharge   Problem: Fluid Volume: Goal: Ability to maintain a balanced intake and output will improve Outcome: Adequate for Discharge   Problem: Health Behavior/Discharge Planning: Goal: Ability to identify and utilize available resources and services will improve Outcome: Adequate for Discharge Goal: Ability to manage health-related needs will improve Outcome: Adequate for Discharge   Problem: Metabolic: Goal: Ability to maintain appropriate glucose levels will improve Outcome: Adequate for Discharge   Problem: Nutritional: Goal: Maintenance of adequate nutrition will improve Outcome: Adequate for Discharge Goal: Progress toward achieving an optimal weight will improve Outcome: Adequate for Discharge   Problem: Skin Integrity: Goal: Risk for impaired skin integrity will decrease Outcome: Adequate for Discharge   Problem: Tissue Perfusion: Goal: Adequacy of tissue perfusion will improve Outcome: Adequate for Discharge   Problem: Education: Goal: Ability to describe self-care measures that may prevent or decrease complications (Diabetes Survival Skills Education) will improve Outcome: Adequate for Discharge Goal: Individualized Educational Video(s) Outcome: Adequate for Discharge   Problem: Cardiac: Goal: Ability to maintain an adequate cardiac output will improve Outcome: Adequate for Discharge   Problem: Health Behavior/Discharge Planning: Goal: Ability to identify and utilize available resources and services will improve Outcome: Adequate for Discharge Goal: Ability to manage health-related  needs will improve Outcome: Adequate for Discharge   Problem: Fluid Volume: Goal: Ability to achieve a balanced intake and output will improve Outcome: Adequate for Discharge   Problem: Metabolic: Goal: Ability to maintain appropriate glucose levels will improve Outcome: Adequate for Discharge

## 2024-09-09 NOTE — Plan of Care (Signed)
 Problem: Education: Goal: Ability to describe self-care measures that may prevent or decrease complications (Diabetes Survival Skills Education) will improve Outcome: Adequate for Discharge Goal: Individualized Educational Video(s) Outcome: Adequate for Discharge   Problem: Coping: Goal: Ability to adjust to condition or change in health will improve Outcome: Adequate for Discharge   Problem: Fluid Volume: Goal: Ability to maintain a balanced intake and output will improve Outcome: Adequate for Discharge   Problem: Health Behavior/Discharge Planning: Goal: Ability to identify and utilize available resources and services will improve Outcome: Adequate for Discharge Goal: Ability to manage health-related needs will improve Outcome: Adequate for Discharge   Problem: Metabolic: Goal: Ability to maintain appropriate glucose levels will improve Outcome: Adequate for Discharge   Problem: Nutritional: Goal: Maintenance of adequate nutrition will improve Outcome: Adequate for Discharge Goal: Progress toward achieving an optimal weight will improve Outcome: Adequate for Discharge   Problem: Skin Integrity: Goal: Risk for impaired skin integrity will decrease Outcome: Adequate for Discharge   Problem: Tissue Perfusion: Goal: Adequacy of tissue perfusion will improve Outcome: Adequate for Discharge   Problem: Education: Goal: Ability to describe self-care measures that may prevent or decrease complications (Diabetes Survival Skills Education) will improve Outcome: Adequate for Discharge Goal: Individualized Educational Video(s) Outcome: Adequate for Discharge   Problem: Cardiac: Goal: Ability to maintain an adequate cardiac output will improve Outcome: Adequate for Discharge   Problem: Health Behavior/Discharge Planning: Goal: Ability to identify and utilize available resources and services will improve Outcome: Adequate for Discharge Goal: Ability to manage health-related  needs will improve Outcome: Adequate for Discharge   Problem: Fluid Volume: Goal: Ability to achieve a balanced intake and output will improve Outcome: Adequate for Discharge   Problem: Metabolic: Goal: Ability to maintain appropriate glucose levels will improve Outcome: Adequate for Discharge   Problem: Nutritional: Goal: Maintenance of adequate nutrition will improve Outcome: Adequate for Discharge Goal: Maintenance of adequate weight for body size and type will improve Outcome: Adequate for Discharge   Problem: Respiratory: Goal: Will regain and/or maintain adequate ventilation Outcome: Adequate for Discharge   Problem: Urinary Elimination: Goal: Ability to achieve and maintain adequate renal perfusion and functioning will improve Outcome: Adequate for Discharge   Problem: Education: Goal: Knowledge of General Education information will improve Description: Including pain rating scale, medication(s)/side effects and non-pharmacologic comfort measures Outcome: Adequate for Discharge   Problem: Health Behavior/Discharge Planning: Goal: Ability to manage health-related needs will improve Outcome: Adequate for Discharge   Problem: Clinical Measurements: Goal: Ability to maintain clinical measurements within normal limits will improve Outcome: Adequate for Discharge Goal: Will remain free from infection Outcome: Adequate for Discharge Goal: Diagnostic test results will improve Outcome: Adequate for Discharge Goal: Respiratory complications will improve Outcome: Adequate for Discharge Goal: Cardiovascular complication will be avoided Outcome: Adequate for Discharge   Problem: Activity: Goal: Risk for activity intolerance will decrease Outcome: Adequate for Discharge   Problem: Nutrition: Goal: Adequate nutrition will be maintained Outcome: Adequate for Discharge   Problem: Coping: Goal: Level of anxiety will decrease Outcome: Adequate for Discharge   Problem:  Elimination: Goal: Will not experience complications related to bowel motility Outcome: Adequate for Discharge Goal: Will not experience complications related to urinary retention Outcome: Adequate for Discharge   Problem: Pain Managment: Goal: General experience of comfort will improve and/or be controlled Outcome: Adequate for Discharge   Problem: Safety: Goal: Ability to remain free from injury will improve Outcome: Adequate for Discharge   Problem: Skin Integrity: Goal: Risk for impaired skin  integrity will decrease Outcome: Adequate for Discharge

## 2024-09-09 NOTE — Assessment & Plan Note (Addendum)
 Liver enzymes have normalized today. - Thiamine  100 mg daily - Folic acid 1 mg daily

## 2024-09-09 NOTE — Discharge Instructions (Addendum)
 Dear Aaron Burke,  Thank you for letting us  participate in your care. You were hospitalized for abdominal pain and nausea and diagnosed with Acute on chronic pancreatitis (HCC). You also had a pseudocyst (a collection of fluid) by your pancreas. This was drained and grew yeast cells. You were treated with fluids, pain medication, IV antibiotics, and IV fungals which were transitioned to oral medications at time of discharge.   MEDICATION CHANGES - Continue fluconazole one pill daily for 4 total weeks - Continue Augmentin two pills daily (one in morning, one in evening) for 4 total weeks  POST-HOSPITAL & CARE INSTRUCTIONS Follow up with Infectious Disease outpatient; you should get a call to schedule this appointment; you can call [phone] to reach them if you don't hear from them in about a week Please call to arrange follow up with your primary care physician in 1 week.  Go to your follow up appointments (listed below)   DOCTOR'S APPOINTMENT   No future appointments.  Follow-up Information     Delayne Artist PARAS, MD Follow up.   Specialty: Family Medicine Why: left message for doctor office to call patient back to set up hospital follow up apt Contact information: [address] [place] [address] [phone]                 Take care and be well!  Family Medicine Teaching Service Inpatient Team [place]  [facility]  [address] [place], [place] [address] [phone]

## 2024-09-09 NOTE — Assessment & Plan Note (Addendum)
 Remains afebrile with improving pain control. - GI consulted, appreciate recs - ID consulted, appreciate assistance with treatment regimen - Abx: PO fluconazole 400 mg daily (11/17- ), PO Augmentin 875-125 mg BID (11/17- ) per ID, will clarify length - s/p IV Fluconazole daily (11/16) - s/p IV Micafungin (11/14-11/15) - s/p IV meropenem 1 g q8h (11/12-11/14) - s/p IV Zosyn (11/11-11/12) - Fungal cultures: pending - Pain: Oxycodone 5 mg q6h while awake, Tylenol  650 mg QID -> decrease oxycodone to 5 mg q8h - Nausea: Zofran  ODT 4 mg q8h PRN - Diet: tolerating Heart healthy diet - Labs: AM CMP, CBC - Repleting electrolytes as needed; giving IV Mag 2g today - Strict Is/Os - TOC to help with medication assistance given patient uninsured

## 2024-09-09 NOTE — Discharge Summary (Addendum)
 Family Medicine Teaching [facility] Discharge Summary  Patient name: Aaron Burke Medical record number: [id number] Date of birth: 1985-12-16 Age: 38 y.o. Gender: male Date of Admission: 09/02/2024  Date of Discharge: 09/09/24 Admitting Physician: Damien Pinal, DO  Primary Care Provider: Delayne Artist PARAS, MD Consultants: GI, IR, ID  Indication for Hospitalization: abdominal pain  Discharge Diagnoses/Problem List:  Principal Problem for Admission: Pancreatitis with Psuedocyst Other Problems addressed during stay:  Principal Problem:   Acute on chronic pancreatitis [facility]) Active Problems:   Alcohol use disorder   Acute pancreatic fluid collection   Peripancreatic fluid collection, growing candida albicans   Elevated liver enzymes   Acute anemia   Hyponatremia   Infected pseudocyst of pancreas   Brief Hospital Course:  Aaron Burke is a 38 y.o. male with history of alcohol use disorder, PTSD, gallstones (s/p cholecystectomy) who was admitted for pancreatitis.  Complicated Pancreatitis Pseudocyst of pancreas growing candida albicans History of gallstone pancreatitis in 2020, s/p cholecystectomy.  Recent admission in October 2025 for pancreatitis with pseudocyst.  Patient returned this admission with severe epigastric pain and inability to p.o., repeat CT scan shows ongoing peritonitis with enlarging pseudocyst. GI was consulted who recommended MRCP.  MRCP showed findings consistent with acute pancreatitis with multiple pseudocysts. Did also show a retroperitoneal fluid collection. Hgb dropped from 12 on 11/10 to 7.5 on 11/11 and patient began to fever; blood cultures were collected and he was started on IV Zosyn (11/11-11/12), later switched to IV meropenem at GI's recommendation (11/12-11/14). Underwent aspiration of pseudocyst with IR on 11/12; pseudocyst aspiration culture grew candida albicans. Given this, ID was consulted, who recommended discontinuing meropenem and  starting micafungin (11/14-11/15). Transitioned to IV fluconazole on 11/16. Transitioned to po fluconazole 400 mg daily and Augmentin 875-125 mg BID on 11/17 for 4 week course per ID (until 12/15).  Pain was managed and diet progressed throughout admission.  Anion gap, resolved T2DM Differential included DKA and starvation ketoacidosis, however no ketones in urine nor acidotic on VBG.  Initially received 1L fluid bolus, then transitioned to T2DM.  Was placed on Endo tool, quickly transitioned off. Maintained on SSI while inpatient.  Other chronic conditions were medically managed with home medications and formulary alternatives as necessary: (Alcohol use disorder with elevated liver enzymes, GERD, insomnia)  PCP Follow-up recommendations: Repeat imaging of pancreatic ductal dilation after recovered with either EUS or MRCP  Patient will need follow-up EGD to evaluate duodenal mucosa (abnormality seen on MRI while inpatient) once recovered from acute course Consider medication for anxiety (had panic symptoms requiring Atarax  prns while inpatient) Ensure outpatient GI follow-up Ensure outpatient ID follow-up    Results/Tests Pending at Time of Discharge:  Unresulted Labs (From admission, onward)     Start     Ordered   09/10/24 0500  CBC  Tomorrow morning,   R       Question:  Specimen collection method  Answer:  Lab=Lab collect   09/09/24 1131   09/10/24 0500  Comprehensive metabolic panel  Tomorrow morning,   R       Question:  Specimen collection method  Answer:  Lab=Lab collect   09/09/24 1131   09/07/24 1028  Yeast Susceptibilities  Add-on,   AD       Comments: Sensitivities for c albicans from pancreatic pseudocyst on 11/12   Question:  Patient immune status  Answer:  Normal   09/07/24 1028             Disposition: Home  Discharge Condition: Stable  Discharge Exam:  Vitals:   09/08/24 2200 09/09/24 0822  BP: (!) 143/89 109/73  Pulse: 84 91  Resp: 18 18  Temp: 98.2  F (36.8 C) 99.2 F (37.3 C)  SpO2: 97% 96%   See earlier progress note by Alan Flies for physical exam.  Significant Procedures:  - 11/14: CT aspiration of pseudocyst with 5 mL of serosanguineous fluid  Significant Labs and Imaging:  Recent Labs  Lab 09/08/24 0824 09/09/24 0608  WBC 20.9* 18.9*  HGB 10.7* 10.7*  HCT 31.7* 32.1*  PLT 368 419*   Recent Labs  Lab 09/08/24 0824 09/09/24 0608  NA 131* 135  K 4.2 4.1  CL 97* 100  CO2 23 24  GLUCOSE 98 106*  BUN <5* <5*  CREATININE 0.55* 0.49*  CALCIUM 8.0* 8.2*  MG 1.7 1.5*  PHOS 4.1 5.0*  ALKPHOS 206* 224*  AST 56* 41  ALT 21 18  ALBUMIN 1.8* 1.7*    CT AP 11/10: IMPRESSION: 1. Interval progression of ill-defined complex fluid collections in the right retroperitoneal space. Collection posterior to the ascending colon measures 5.1 x 2.8 cm today compared to 3.8 x 2.1 cm when remeasured in a similar fashion on the prior study. Collection between the duodenum and right kidney measures 4.4 x 2.8 cm today compared to 4.0 x 1.8 cm previously. These are compatible with evolving pseudocysts. Additional ill-defined collections are seen anterior to the right psoas muscle tracking down towards the pelvis, similar to prior. 2. Areas of ill-defined hypoperfusion in the inferior right kidney are new in the interval. Imaging features may be secondary to the adjacent inflammatory changes although pyelonephritis could have this appearance. 3. Edema/inflammation along the course of the proximal sigmoid colon. This is likely secondary to the pancreatic inflammatory process. 4. Mild bladder wall thickening anteriorly. Correlation with urinalysis suggested. 5. Asymmetric elevation right hemidiaphragm with basilar atelectasis, right greater than left.  MRCP 11/11: IMPRESSION: 1. Diffuse pancreatic and peripancreatic inflammatory change with multiple rim enhancing complex fluid collections in the peripancreatic space and retroperitoneum.  Imaging features compatible with acute pancreatitis with multiple pseudocysts. Superinfection of the pseudocysts or abscess not excluded. Close follow-up warranted to exclude the possibility of underlying pancreatic mass lesion. 2. Main pancreatic duct is dilated up to 7 mm in the head of the pancreas and then tapers into the ampullary portion. There is some hyperenhancement in the wall of the duodenum in the region of the ampulla with some mural irregularity. This is presumably secondary to inflammation. Follow-up recommended to ensure resolution. 3. Hepatomegaly. Left hepatic lobe is markedly atrophic. 4. No substantial biliary dilatation. MRCP imaging is motion degraded but no definite choledocholithiasis. 5. Small area of hypo perfusion lower pole right kidney as seen on recent CT. This is immediately adjacent to an inflammatory lesion in the retroperitoneal space and presumably secondary to the adjacent inflammatory process. 6. Dependent atelectasis, right greater than left.   Discharge Medications:  Allergies as of 09/09/2024       Reactions   Percocet [oxycodone-acetaminophen ] Other (See Comments)   makes me feel uncomfortable        Medication List     PAUSE taking these medications    DULoxetine  30 MG capsule Wait to take this until your doctor or other care provider tells you to start again. Commonly known as: CYMBALTA  Take 1 capsule (30 mg total) by mouth daily.   insulin glargine 100 UNIT/ML injection Wait to take this until your doctor or  other care provider tells you to start again. Commonly known as: LANTUS Inject 0.05 mLs (5 Units total) into the skin daily. What changed: how much to take       STOP taking these medications    PHENobarbital 32.4 MG tablet Commonly known as: LUMINAL       TAKE these medications    acetaminophen  500 MG tablet Commonly known as: TYLENOL  Take 2 tablets (1,000 mg total) by mouth every 6 (six) hours as needed (pain).    amoxicillin-clavulanate 875-125 MG tablet Commonly known as: AUGMENTIN Take 1 tablet by mouth every 12 (twelve) hours for 28 days.   CertaVite/Antioxidants Tabs Take 1 tablet by mouth daily.   fluconazole 200 MG tablet Commonly known as: DIFLUCAN Take 2 tablets (400 mg total) by mouth daily for 27 days. Start taking on: September 10, 2024   folic acid 1 MG tablet Commonly known as: FOLVITE Take 1 tablet (1 mg total) by mouth daily.   ondansetron  4 MG disintegrating tablet Commonly known as: ZOFRAN -ODT Take 1 tablet (4 mg total) by mouth every 8 (eight) hours as needed for nausea or vomiting.   oxyCODONE 5 MG immediate release tablet Commonly known as: Oxy IR/ROXICODONE Take 1 tablet (5 mg total) by mouth every 8 (eight) hours as needed for moderate pain (pain score 4-6) or breakthrough pain. What changed:  when to take this reasons to take this   pantoprazole  40 MG tablet Commonly known as: PROTONIX  Take 1 tablet (40 mg total) by mouth daily.   thiamine  100 MG tablet Commonly known as: VITAMIN B1 Take 1 tablet (100 mg total) by mouth daily.        Discharge Instructions: Please refer to Patient Instructions section of EMR for full details.  Patient was counseled important signs and symptoms that should prompt return to medical care, changes in medications, dietary instructions, activity restrictions, and follow up appointments.   Follow-Up Appointments:  Follow-up Information     Delayne Artist PARAS, MD Follow up.   Specialty: Family Medicine Why: left message for doctor office to call patient back to set up hospital follow up apt Contact information: [address] [place] [address] [phone]                 [name], MD 09/09/2024, 1:28 PM PGY-1, [facility]   I have verified that the service and findings are accurately documented in the resident's note above.  Damien Cassis, MD                  09/09/2024, 1:54 PM

## 2024-09-09 NOTE — Assessment & Plan Note (Addendum)
 Normal today at 135. - Encourage good po intake - Continue to monitor

## 2024-09-09 NOTE — Assessment & Plan Note (Addendum)
 Anemia: Hgb stable, transfusion threshold of < 7.5. GERD: Protonix  40 mg daily DMT2: Holding home Lantus 5 units daily, last A1c 8.0; on sSSI in hospital, checking CBGs TID with meals and at bedtime Insomnia: PO benadryl 25 mg daily at bedtime (Melatonin was not helpful per patient) Anxiety: Reports panic symptoms this morning, given Atarax  prn

## 2024-09-09 NOTE — Plan of Care (Signed)
 Id brief note  Patient clinically doing well Infected pancreatitis associated intraabd fluid collection  On abx, aspirate cx candida albican  Would assume these are polymicrobial process   -augmentin/fluc for 4 weeks -would then observe off abx to see how he does -id clinic f/u 12/16 -discussed with team

## 2024-09-09 NOTE — Progress Notes (Signed)
 Daily Progress Note Intern Pager: [phone]  Patient name: Aaron Burke Medical record number: [id number] Date of birth: [birth year] Age: 38 y.o. Gender: male  Primary Care Provider: Delayne Artist PARAS, MD Consultants: GI, Gen Surg (not officially consulted, s/o), IR, ID  Code Status: Full  Pt Overview and Major Events to Date:  - 11/10: Admitted, GI consulted - 11/11: MRCP - 11/12: Pseudocyst aspiration - 11/14: ID consulted  Medical Decision Making:  Aaron Burke is a 38 y.o. male with PMH of PTSD, depression (hx suicide attempt), Alcohol use disorder, GERD, T2DM. Admitted for pancreatitis with pseudocyst s/p aspiration 11/12. Aspiration growing candida albicans, ID following. Blood cultures with no growth. WBC had increased following switch from meropenem to micafungin, patient now on fluconazole with improvement in WBC from 20.9 to 18.9 today. Assessment & Plan Acute on chronic pancreatitis (HCC) Peripancreatic fluid collection Remains afebrile with improving pain control. - GI consulted, appreciate recs - ID consulted, appreciate assistance with treatment regimen - Abx: PO fluconazole 400 mg daily (11/17- ), PO Augmentin 875-125 mg BID (11/17- ) per ID, will clarify length - s/p IV Fluconazole daily (11/16) - s/p IV Micafungin (11/14-11/15) - s/p IV meropenem 1 g q8h (11/12-11/14) - s/p IV Zosyn (11/11-11/12) - Fungal cultures: pending - Pain: Oxycodone 5 mg q6h while awake, Tylenol  650 mg QID -> decrease oxycodone to 5 mg q8h - Nausea: Zofran  ODT 4 mg q8h PRN - Diet: tolerating Heart healthy diet - Labs: AM CMP, CBC - Repleting electrolytes as needed; giving IV Mag 2g today - Strict Is/Os - TOC to help with medication assistance given patient uninsured Alcohol use disorder Elevated liver enzymes Liver enzymes have normalized today. - Thiamine  100 mg daily - Folic acid 1 mg daily Hyponatremia Normal today at 135. - Encourage good po intake - Continue to  monitor Chronic health problem Anemia: Hgb stable, transfusion threshold of < 7.5. GERD: Protonix  40 mg daily DMT2: Holding home Lantus 5 units daily, last A1c 8.0; on sSSI in hospital, checking CBGs TID with meals and at bedtime Insomnia: PO benadryl 25 mg daily at bedtime (Melatonin was not helpful per patient) Anxiety: Reports panic symptoms this morning, given Atarax  prn   FEN/GI: Heart healthy PPx: SCDs Dispo: Home today pending clarification with ID regarding abx course/timeline.  Subjective:  Reports his abdominal pain remains improved.  Also reports sleeping well overnight.  No other concerns.  Objective: Temp:  [98.2 F (36.8 C)-99.8 F (37.7 C)] 98.2 F (36.8 C) (11/16 2200) Pulse Rate:  [83-96] 84 (11/16 2200) Resp:  [18-21] 18 (11/16 2200) BP: (114-143)/(76-95) 143/89 (11/16 2200) SpO2:  [95 %-100 %] 97 % (11/16 2200) Physical Exam: General: Patient lying down in bed, no acute distress. Cardiovascular: Regular rate and rhythm, no murmurs/rubs/gallops. Respiratory: Normal work of breathing on room air. Clear to auscultation bilaterally; no wheezes, crackles. Abdomen: Bowel sounds present and normoactive bilaterally. Soft, nondistended.  Minimal tenderness to palpation of bilateral lower abdomen, no rebound, no guarding. Extremities: Skin warm, dry. No bilateral lower extremity edema.  Laboratory: Most recent CBC Lab Results  Component Value Date   WBC 18.9 (H) 09/09/2024   HGB 10.7 (L) 09/09/2024   HCT 32.1 (L) 09/09/2024   MCV 95.0 09/09/2024   PLT 419 (H) 09/09/2024   Most recent BMP    Latest Ref Rng & Units 09/09/2024    6:08 AM  BMP  Glucose 70 - 99 mg/dL 893   BUN 6 - 20 mg/dL <5  Creatinine 0.61 - 1.24 mg/dL 9.50   Sodium 864 - 854 mmol/L 135   Potassium 3.5 - 5.1 mmol/L 4.1   Chloride 98 - 111 mmol/L 100   CO2 22 - 32 mmol/L 24   Calcium 8.9 - 10.3 mg/dL 8.2   Mag: 1.5   Phos: 5   Aaron Palma, MD 09/09/2024, 7:57 AM  PGY-1, [facility] FPTS Intern pager: [phone], text pages welcome Secure chat group [facility] Teaching Service

## 2024-09-09 NOTE — TOC Transition Note (Signed)
 Transition of Care [facility]) - Discharge Note   Patient Details  Name: Anthonny Schiller MRN: [id number] Date of Birth: June 28, 1986  Transition of [facility] CM/SW Contact:  Sudie Erminio Deems, RN Phone Number: 09/09/2024, 2:14 PM   Clinical Narrative:  Patient will discharge today. Patient is without insurance. Medications were sent to [facility] and the cost is over $500.00. Patient will be unable to afford without insurance. ICM will place MATCH in [facility] for Medications. New Rx sent to [facility]. No further needs identified at this time.    MATCH MEDICATION ASSISTANCE CARD Pharmacies please call [phone] for claim processing assistance.  Rx BIN: [id number] Rx Group: [id number] Rx PCN: [id number] Relationship Code: 1 Person Code: [id number]  Patient ID (MRN): [id number]    Patient Name: Kartik Fernando   Patient DOB: 03-Dec-1985   Discharge Date: 09-09-24   Expiration Date: 09-17-24 (must be filled within 7 days of discharge)     Social Drivers of Health (SDOH) Interventions SDOH Screenings   Food Insecurity: No Food Insecurity (09/02/2024)  Housing: Low Risk  (09/02/2024)  Transportation Needs: No Transportation Needs (09/02/2024)  Utilities: Not At Risk (09/02/2024)  Alcohol Screen: Low Risk  (12/13/2022)  Depression (PHQ2-9): Medium Risk (02/22/2023)  Social Connections: Unknown (11/30/2022)   Received from [facility]  Tobacco Use: Medium Risk (09/02/2024)     Readmission Risk Interventions     No data to display

## 2024-09-13 LAB — YEAST SUSCEPTIBILITIES
Amphotericin B MIC: 0.5
Fluconazole Islt MIC: 0.5
ISAVUCONAZOLE MIC: 0.008
Itraconazole MIC: 0.06
Posaconazole MIC: 0.03

## 2024-09-16 LAB — AEROBIC/ANAEROBIC CULTURE W GRAM STAIN (SURGICAL/DEEP WOUND)

## 2024-10-03 LAB — FUNGUS CULTURE WITH STAIN

## 2024-10-03 LAB — FUNGUS CULTURE RESULT

## 2024-10-03 LAB — FUNGAL ORGANISM REFLEX

## 2024-10-08 ENCOUNTER — Telehealth: Payer: Self-pay

## 2024-10-08 ENCOUNTER — Inpatient Hospital Stay: Payer: Self-pay | Admitting: Infectious Diseases

## 2024-10-08 NOTE — Telephone Encounter (Signed)
 Called patient to reschedule misses appointment, no answer left voice message to contact clinic.
